# Patient Record
Sex: Male | Born: 1945 | Race: White | Hispanic: No | Marital: Married | Smoking: Never smoker
Health system: Southern US, Community
[De-identification: ages and names within clinical notes are randomized; demographics above are authoritative.]

## PROBLEM LIST (undated history)

## (undated) DIAGNOSIS — N281 Cyst of kidney, acquired: Secondary | ICD-10-CM

## (undated) DIAGNOSIS — I480 Paroxysmal atrial fibrillation: Secondary | ICD-10-CM

## (undated) DIAGNOSIS — Z9289 Personal history of other medical treatment: Secondary | ICD-10-CM

## (undated) DIAGNOSIS — Z9889 Other specified postprocedural states: Secondary | ICD-10-CM

## (undated) DIAGNOSIS — Z8711 Personal history of peptic ulcer disease: Secondary | ICD-10-CM

## (undated) DIAGNOSIS — N200 Calculus of kidney: Secondary | ICD-10-CM

## (undated) DIAGNOSIS — M1990 Unspecified osteoarthritis, unspecified site: Secondary | ICD-10-CM

## (undated) DIAGNOSIS — E119 Type 2 diabetes mellitus without complications: Secondary | ICD-10-CM

## (undated) DIAGNOSIS — I2510 Atherosclerotic heart disease of native coronary artery without angina pectoris: Secondary | ICD-10-CM

## (undated) DIAGNOSIS — I471 Supraventricular tachycardia, unspecified: Secondary | ICD-10-CM

## (undated) DIAGNOSIS — E785 Hyperlipidemia, unspecified: Secondary | ICD-10-CM

## (undated) DIAGNOSIS — Z87828 Personal history of other (healed) physical injury and trauma: Secondary | ICD-10-CM

## (undated) DIAGNOSIS — K5730 Diverticulosis of large intestine without perforation or abscess without bleeding: Secondary | ICD-10-CM

## (undated) DIAGNOSIS — N529 Male erectile dysfunction, unspecified: Secondary | ICD-10-CM

## (undated) DIAGNOSIS — I483 Typical atrial flutter: Secondary | ICD-10-CM

## (undated) DIAGNOSIS — Z87442 Personal history of urinary calculi: Secondary | ICD-10-CM

## (undated) DIAGNOSIS — Z8679 Personal history of other diseases of the circulatory system: Secondary | ICD-10-CM

## (undated) DIAGNOSIS — Z8719 Personal history of other diseases of the digestive system: Secondary | ICD-10-CM

## (undated) DIAGNOSIS — G4733 Obstructive sleep apnea (adult) (pediatric): Secondary | ICD-10-CM

## (undated) DIAGNOSIS — J309 Allergic rhinitis, unspecified: Secondary | ICD-10-CM

## (undated) DIAGNOSIS — Z85828 Personal history of other malignant neoplasm of skin: Secondary | ICD-10-CM

## (undated) DIAGNOSIS — N4340 Spermatocele of epididymis, unspecified: Secondary | ICD-10-CM

## (undated) HISTORY — DX: Hyperlipidemia, unspecified: E78.5

## (undated) HISTORY — DX: Typical atrial flutter: I48.3

## (undated) HISTORY — PX: OTHER SURGICAL HISTORY: SHX169

## (undated) HISTORY — PX: EPIDIDYMIS SURGERY: SHX843

---
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
## 1992-03-04 HISTORY — PX: LAPAROSCOPIC NISSEN FUNDOPLICATION: SHX1932

## 1996-03-04 HISTORY — PX: TOTAL KNEE ARTHROPLASTY: SHX125

## 1997-07-06 ENCOUNTER — Ambulatory Visit (HOSPITAL_COMMUNITY): Admission: RE | Admit: 1997-07-06 | Discharge: 1997-07-06 | Payer: Self-pay | Admitting: Gastroenterology

## 1998-02-06 ENCOUNTER — Ambulatory Visit (HOSPITAL_COMMUNITY): Admission: RE | Admit: 1998-02-06 | Discharge: 1998-02-06 | Payer: Self-pay | Admitting: Gastroenterology

## 1998-06-13 ENCOUNTER — Ambulatory Visit (HOSPITAL_COMMUNITY): Admission: RE | Admit: 1998-06-13 | Discharge: 1998-06-13 | Payer: Self-pay | Admitting: Urology

## 1999-04-09 ENCOUNTER — Ambulatory Visit (HOSPITAL_COMMUNITY): Admission: RE | Admit: 1999-04-09 | Discharge: 1999-04-09 | Payer: Self-pay | Admitting: Gastroenterology

## 2000-07-11 ENCOUNTER — Ambulatory Visit (HOSPITAL_COMMUNITY): Admission: RE | Admit: 2000-07-11 | Discharge: 2000-07-11 | Payer: Self-pay | Admitting: Gastroenterology

## 2000-07-11 ENCOUNTER — Encounter (INDEPENDENT_AMBULATORY_CARE_PROVIDER_SITE_OTHER): Payer: Self-pay | Admitting: *Deleted

## 2000-09-18 ENCOUNTER — Ambulatory Visit (HOSPITAL_COMMUNITY): Admission: RE | Admit: 2000-09-18 | Discharge: 2000-09-18 | Payer: Self-pay | Admitting: Urology

## 2000-09-18 ENCOUNTER — Encounter: Payer: Self-pay | Admitting: Urology

## 2003-04-25 ENCOUNTER — Encounter (INDEPENDENT_AMBULATORY_CARE_PROVIDER_SITE_OTHER): Payer: Self-pay | Admitting: Specialist

## 2003-04-25 ENCOUNTER — Ambulatory Visit (HOSPITAL_COMMUNITY): Admission: RE | Admit: 2003-04-25 | Discharge: 2003-04-25 | Payer: Self-pay | Admitting: Gastroenterology

## 2004-05-10 ENCOUNTER — Ambulatory Visit: Payer: Self-pay | Admitting: Internal Medicine

## 2004-05-16 ENCOUNTER — Ambulatory Visit: Payer: Self-pay | Admitting: Internal Medicine

## 2004-06-14 ENCOUNTER — Ambulatory Visit: Payer: Self-pay | Admitting: Internal Medicine

## 2005-06-05 ENCOUNTER — Ambulatory Visit: Payer: Self-pay | Admitting: Internal Medicine

## 2005-06-12 ENCOUNTER — Ambulatory Visit: Payer: Self-pay | Admitting: Internal Medicine

## 2006-06-13 ENCOUNTER — Ambulatory Visit: Payer: Self-pay | Admitting: Internal Medicine

## 2006-06-13 LAB — CONVERTED CEMR LAB
ALT: 20 units/L (ref 0–40)
Basophils Relative: 0.6 % (ref 0.0–1.0)
Bilirubin, Direct: 0.1 mg/dL (ref 0.0–0.3)
CO2: 29 meq/L (ref 19–32)
Calcium: 8.8 mg/dL (ref 8.4–10.5)
Eosinophils Relative: 3.6 % (ref 0.0–5.0)
GFR calc Af Amer: 127 mL/min
Glucose, Bld: 97 mg/dL (ref 70–99)
Hemoglobin: 15.8 g/dL (ref 13.0–17.0)
Leukocytes, UA: NEGATIVE
Lymphocytes Relative: 33.4 % (ref 12.0–46.0)
Monocytes Absolute: 0.6 10*3/uL (ref 0.2–0.7)
Neutro Abs: 2.7 10*3/uL (ref 1.4–7.7)
Nitrite: NEGATIVE
Platelets: 242 10*3/uL (ref 150–400)
Specific Gravity, Urine: 1.025 (ref 1.000–1.03)
Total Bilirubin: 0.9 mg/dL (ref 0.3–1.2)
Total Protein, Urine: NEGATIVE mg/dL
Total Protein: 6.5 g/dL (ref 6.0–8.3)
WBC: 5.3 10*3/uL (ref 4.5–10.5)

## 2006-06-18 ENCOUNTER — Ambulatory Visit: Payer: Self-pay | Admitting: Internal Medicine

## 2006-07-04 ENCOUNTER — Ambulatory Visit: Payer: Self-pay | Admitting: Internal Medicine

## 2006-11-05 ENCOUNTER — Ambulatory Visit: Payer: Self-pay | Admitting: Pulmonary Disease

## 2006-11-19 ENCOUNTER — Ambulatory Visit (HOSPITAL_BASED_OUTPATIENT_CLINIC_OR_DEPARTMENT_OTHER): Admission: RE | Admit: 2006-11-19 | Discharge: 2006-11-19 | Payer: Self-pay | Admitting: Pulmonary Disease

## 2006-11-19 ENCOUNTER — Ambulatory Visit: Payer: Self-pay | Admitting: Pulmonary Disease

## 2007-02-17 ENCOUNTER — Encounter: Payer: Self-pay | Admitting: Orthopedic Surgery

## 2007-02-18 ENCOUNTER — Ambulatory Visit: Payer: Self-pay | Admitting: Cardiology

## 2007-02-18 ENCOUNTER — Inpatient Hospital Stay (HOSPITAL_COMMUNITY): Admission: EM | Admit: 2007-02-18 | Discharge: 2007-02-24 | Payer: Self-pay | Admitting: Orthopedic Surgery

## 2007-02-19 ENCOUNTER — Ambulatory Visit: Payer: Self-pay | Admitting: Infectious Diseases

## 2007-02-19 ENCOUNTER — Encounter (INDEPENDENT_AMBULATORY_CARE_PROVIDER_SITE_OTHER): Payer: Self-pay | Admitting: Orthopedic Surgery

## 2007-03-04 ENCOUNTER — Inpatient Hospital Stay (HOSPITAL_COMMUNITY): Admission: AD | Admit: 2007-03-04 | Discharge: 2007-03-10 | Payer: Self-pay | Admitting: Orthopedic Surgery

## 2007-07-06 ENCOUNTER — Inpatient Hospital Stay (HOSPITAL_COMMUNITY): Admission: RE | Admit: 2007-07-06 | Discharge: 2007-07-09 | Payer: Self-pay | Admitting: Orthopedic Surgery

## 2007-07-06 HISTORY — PX: REVISION TOTAL KNEE ARTHROPLASTY: SUR1280

## 2007-11-25 ENCOUNTER — Telehealth: Payer: Self-pay | Admitting: Internal Medicine

## 2008-01-18 ENCOUNTER — Ambulatory Visit: Payer: Self-pay | Admitting: Internal Medicine

## 2008-01-18 DIAGNOSIS — K5730 Diverticulosis of large intestine without perforation or abscess without bleeding: Secondary | ICD-10-CM | POA: Insufficient documentation

## 2008-01-18 DIAGNOSIS — K219 Gastro-esophageal reflux disease without esophagitis: Secondary | ICD-10-CM | POA: Insufficient documentation

## 2008-01-18 DIAGNOSIS — Z87442 Personal history of urinary calculi: Secondary | ICD-10-CM | POA: Insufficient documentation

## 2008-01-18 DIAGNOSIS — J309 Allergic rhinitis, unspecified: Secondary | ICD-10-CM | POA: Insufficient documentation

## 2008-01-18 DIAGNOSIS — R7302 Impaired glucose tolerance (oral): Secondary | ICD-10-CM | POA: Insufficient documentation

## 2008-01-18 DIAGNOSIS — E785 Hyperlipidemia, unspecified: Secondary | ICD-10-CM | POA: Insufficient documentation

## 2008-01-18 DIAGNOSIS — G4733 Obstructive sleep apnea (adult) (pediatric): Secondary | ICD-10-CM | POA: Insufficient documentation

## 2008-01-18 LAB — CONVERTED CEMR LAB
ALT: 18 units/L (ref 0–53)
AST: 20 units/L (ref 0–37)
Albumin: 3.7 g/dL (ref 3.5–5.2)
Alkaline Phosphatase: 80 units/L (ref 39–117)
BUN: 17 mg/dL (ref 6–23)
Basophils Relative: 0.1 % (ref 0.0–3.0)
CO2: 27 meq/L (ref 19–32)
Chloride: 108 meq/L (ref 96–112)
Cholesterol: 155 mg/dL (ref 0–200)
Creatinine, Ser: 0.9 mg/dL (ref 0.4–1.5)
Creatinine,U: 183.5 mg/dL
Eosinophils Relative: 2 % (ref 0.0–5.0)
Hgb A1c MFr Bld: 5.8 % (ref 4.6–6.0)
LDL Cholesterol: 101 mg/dL — ABNORMAL HIGH (ref 0–99)
Leukocytes, UA: NEGATIVE
Lymphocytes Relative: 32.4 % (ref 12.0–46.0)
Monocytes Relative: 14.4 % — ABNORMAL HIGH (ref 3.0–12.0)
Neutrophils Relative %: 51.1 % (ref 43.0–77.0)
Nitrite: NEGATIVE
PSA: 1.08 ng/mL (ref 0.10–4.00)
Platelets: 200 10*3/uL (ref 150–400)
Potassium: 4.5 meq/L (ref 3.5–5.1)
RBC: 5.51 M/uL (ref 4.22–5.81)
Specific Gravity, Urine: 1.02 (ref 1.000–1.03)
Total CHOL/HDL Ratio: 5.3
Urine Glucose: NEGATIVE mg/dL
Urobilinogen, UA: 0.2 (ref 0.0–1.0)
Vit D, 1,25-Dihydroxy: 31 (ref 30–89)
WBC: 7.3 10*3/uL (ref 4.5–10.5)

## 2008-03-03 ENCOUNTER — Ambulatory Visit: Payer: Self-pay | Admitting: Internal Medicine

## 2008-03-03 DIAGNOSIS — L989 Disorder of the skin and subcutaneous tissue, unspecified: Secondary | ICD-10-CM | POA: Insufficient documentation

## 2008-03-03 DIAGNOSIS — M549 Dorsalgia, unspecified: Secondary | ICD-10-CM | POA: Insufficient documentation

## 2008-06-30 ENCOUNTER — Encounter: Payer: Self-pay | Admitting: Internal Medicine

## 2008-08-09 ENCOUNTER — Encounter: Payer: Self-pay | Admitting: Internal Medicine

## 2009-10-03 ENCOUNTER — Encounter: Payer: Self-pay | Admitting: Internal Medicine

## 2009-10-03 ENCOUNTER — Ambulatory Visit: Payer: Self-pay | Admitting: Internal Medicine

## 2009-10-03 DIAGNOSIS — R5383 Other fatigue: Secondary | ICD-10-CM | POA: Insufficient documentation

## 2009-10-03 DIAGNOSIS — H918X9 Other specified hearing loss, unspecified ear: Secondary | ICD-10-CM | POA: Insufficient documentation

## 2009-10-03 DIAGNOSIS — N529 Male erectile dysfunction, unspecified: Secondary | ICD-10-CM | POA: Insufficient documentation

## 2009-10-06 LAB — CONVERTED CEMR LAB
Testosterone Free: 48.9 pg/mL (ref 47.0–244.0)
Testosterone: 298.57 ng/dL — ABNORMAL LOW (ref 350–890)

## 2009-10-09 ENCOUNTER — Encounter: Payer: Self-pay | Admitting: Internal Medicine

## 2010-03-25 ENCOUNTER — Encounter: Payer: Self-pay | Admitting: Orthopedic Surgery

## 2010-04-01 LAB — CONVERTED CEMR LAB
AST: 21 units/L (ref 0–37)
Albumin: 3.9 g/dL (ref 3.5–5.2)
Basophils Absolute: 0 10*3/uL (ref 0.0–0.1)
Bilirubin Urine: NEGATIVE
CO2: 26 meq/L (ref 19–32)
Calcium: 8.9 mg/dL (ref 8.4–10.5)
Cholesterol: 179 mg/dL (ref 0–200)
Creatinine, Ser: 0.7 mg/dL (ref 0.4–1.5)
Eosinophils Absolute: 0.3 10*3/uL (ref 0.0–0.7)
HCT: 49.3 % (ref 39.0–52.0)
HDL: 33.9 mg/dL — ABNORMAL LOW (ref >39.00)
Hemoglobin: 17 g/dL (ref 13.0–17.0)
Hgb A1c MFr Bld: 6 % (ref 4.6–6.5)
Iron: 99 ug/dL (ref 42–165)
Leukocytes, UA: NEGATIVE
Lymphs Abs: 2.3 10*3/uL (ref 0.7–4.0)
MCHC: 34.5 g/dL (ref 30.0–36.0)
MCV: 89.7 fL (ref 78.0–100.0)
Microalb Creat Ratio: 1.3 mg/g (ref 0.0–30.0)
Monocytes Absolute: 0.9 10*3/uL (ref 0.1–1.0)
Monocytes Relative: 11.1 % (ref 3.0–12.0)
Neutro Abs: 4.5 10*3/uL (ref 1.4–7.7)
Nitrite: NEGATIVE
PSA: 1.21 ng/mL (ref 0.10–4.00)
Platelets: 203 10*3/uL (ref 150.0–400.0)
RDW: 13.3 % (ref 11.5–14.6)
Sed Rate: 4 mm/hr (ref 0–22)
Sodium: 143 meq/L (ref 135–145)
TSH: 1.22 microintl units/mL (ref 0.35–5.50)
Total Bilirubin: 0.7 mg/dL (ref 0.3–1.2)
Total CHOL/HDL Ratio: 5
Total Protein, Urine: NEGATIVE mg/dL
Transferrin: 264.7 mg/dL (ref 212.0–360.0)
Triglycerides: 127 mg/dL (ref 0.0–149.0)
pH: 5.5 (ref 5.0–8.0)
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
## 2010-04-03 NOTE — Consult Note (Signed)
Summary: [facility]  [facility]   Imported By: Sherian Rein 10/13/2009 09:22:17  _____________________________________________________________________  External Attachment:    Type:   Image     Comment:   External Document

## 2010-04-10 ENCOUNTER — Other Ambulatory Visit: Payer: Medicare Other

## 2010-04-10 ENCOUNTER — Encounter (INDEPENDENT_AMBULATORY_CARE_PROVIDER_SITE_OTHER): Payer: Self-pay | Admitting: *Deleted

## 2010-04-10 ENCOUNTER — Ambulatory Visit (INDEPENDENT_AMBULATORY_CARE_PROVIDER_SITE_OTHER): Payer: Medicare Other | Admitting: Internal Medicine

## 2010-04-10 ENCOUNTER — Encounter: Payer: Self-pay | Admitting: Internal Medicine

## 2010-04-10 ENCOUNTER — Other Ambulatory Visit: Payer: Self-pay | Admitting: Internal Medicine

## 2010-04-10 DIAGNOSIS — J309 Allergic rhinitis, unspecified: Secondary | ICD-10-CM

## 2010-04-10 DIAGNOSIS — E785 Hyperlipidemia, unspecified: Secondary | ICD-10-CM

## 2010-04-10 DIAGNOSIS — E119 Type 2 diabetes mellitus without complications: Secondary | ICD-10-CM

## 2010-04-10 DIAGNOSIS — M549 Dorsalgia, unspecified: Secondary | ICD-10-CM

## 2010-04-10 LAB — LIPID PANEL
Cholesterol: 159 mg/dL (ref 0–200)
LDL Cholesterol: 90 mg/dL (ref 0–99)
Total CHOL/HDL Ratio: 5

## 2010-04-10 LAB — BASIC METABOLIC PANEL
BUN: 13 mg/dL (ref 6–23)
CO2: 29 mEq/L (ref 19–32)
Calcium: 9.7 mg/dL (ref 8.4–10.5)
Chloride: 104 mEq/L (ref 96–112)
Creatinine, Ser: 0.8 mg/dL (ref 0.4–1.5)
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
## 2010-07-17 NOTE — Op Note (Signed)
NAMELENNIX, KNEISEL                ACCOUNT NO.:  [id number]   MEDICAL RECORD NO.:  [id number]          PATIENT TYPE:  INP   LOCATION:  1235                         FACILITY:  [facility]   PHYSICIAN:  Feliberto Gottron. Turner Daniels, M.D.   DATE OF BIRTH:  05-20-1945   DATE OF PROCEDURE:  02/18/2007  DATE OF DISCHARGE:                               OPERATIVE REPORT   PREOPERATIVE DIAGNOSIS:  Acute infection of a right total knee that was  placed in 1998 by me.   POSTOPERATIVE DIAGNOSIS:  Acute infection of a right total knee that was  placed in 1998 by me.   PROCEDURE:  Right total knee radical irrigation and debridement,  takedown of arthrofibrosis and revision of a tibial polyethylene  component.   SURGEON:  Feliberto Gottron.  Turner Daniels, MD.   FIRST ASSISTANT:  Skip Mayer PA-C.   ANESTHETIC:  General endotracheal.   ESTIMATED BLOOD LOSS:  Minimal.   FLUID REPLACEMENT:  A liter of crystalloid.   DRAINS PLACED:  Two medium Hemovacs and a Foley catheter.   URINE OUTPUT:  300 mL.   INDICATIONS FOR PROCEDURE:  A 65 year old man who had an [employer]  Scorpio right total knee performed by me in 1998.  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.  Was last seen in our office  around 2001 and was doing great.  He was then lost to follow-up.  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  That evening he ran a  fever up to a 102 and he came to the [facility] emergency room, where an  aspiration revealed purulent material in his right total knee.  My  partner, Dr. Jodi Geralds, was contacted.  The patient was admitted to  the hospital.  For logistical reasons he was transferred to [facility] because of overcrowding at [facility], and I saw him around noontime  today.  By then his Gram stain had come back showing 45,000 cells, all  polys, with  intracellular gram-positive cocci.  He is taken for a  radical irrigation and debridement versus removal of parts.  We had a  half-hour discussion of his options prior to surgery.  He understands  that complete removal of the parts leads to the highest success rate for  reimplantation.  However, because he has an acute infection of sudden  onset and relatively quick response surgically, it is reasonable to do a  radical irrigation and debridement, swap out the polyethylene, and treat  him with IV antibiotics and rifampin for 6-12 weeks.  The options were  discussed extensively with the patient.  It is his desire to try the  irrigation and debridement first and then if this fails, he is prepared  for removal of the components, placement of a PMMA spacer with  antibiotics and then reimplantation at a later date.   DESCRIPTION OF PROCEDURE:  The  patient identified by armband, taken to  the operating room at [facility].  Appropriate  anesthetic monitors were attached and general endotracheal anesthesia  induced with the patient in a supine position.  A tourniquet was applied  high to the right thigh and the right lower extremity was then prepped  and draped in the usual sterile fashion from the ankle to the  tourniquet, and it should be noted that we kept the limb elevated for 5  minutes and then inflated the tourniquet without using the Esmarch wrap.  After the tourniquet was up, we recreated the anterior midline incision  used for the implantation of the original total knee, cut through the  skin and subcutaneous tissue, which had a normal appearance, as did the  patellar tendon.  When we got through the tendon, the synovium was quite  inflamed and when we cut through the synovium, probably 70 or 80 mL of  pus came forth and was removed with suction.  We then set about  performing a fairly radical synovectomy, going from anterior to  posterior around both sides.  This  took a great deal of time because he  did have arthrofibrosis with only about 0-30 degrees' motion  preoperatively, and x-rays taken in 2001 had shown some growth of  posterior osteophytes, which limited his motion.  We continued to work  our way around posteromedially as well as posterolaterally, removed  those posterior osteophytes and completed the synovectomy posteriorly  and through the box.  Satisfied with the removal of all the infected  synovium, we then carefully tested each one of the components to see if  there was any evidence of loosening of the components.  There was not,  and the cement nails were intact to the patella, the tibia and the  femur.  We actually used the extraction instruments with a manual  traction to see if any of the parts were loose, and they were not.  At  this point we irrigated the wound out with 3 L of pulse-lavage normal  saline.  We then bathed the components in Clorpactin solution for 1-  minute intervals and then irrigated out with another 3 L of pulse  lavage.  At this point we sized for a #15-mm replacement tibial bearing,  as it had been removed at the beginning of the procedure, to enhance our  ability to get to the posterior compartment and also to remove one piece  of foreign material.  We performed trials with a 12-mm and a 15-mm  spacer.  The 15 had the best fit.  That was the one that was removed and  a new 15-mm #11 tibial bearing was inserted without difficulty.  We  checked the knee for stability and his range of motion, which was only 0-  30 to start with, was now 0-120 degrees, and no thumb pressure was  required to keep the patella in place.  Again medium Hemovac drains were  placed deep in the wound.  The parapatellar arthrotomy was closed with  running 0 Vicryl suture, the subcutaneous tissue with a 2-0 Vicryl  suture, and the skin with skin staples.  A dressing of Xeroform 4x4  dressing sponges, Webril and an Ace wrap was then  applied.  The  tourniquet was let down, the Hemovac drains were charged, the patient  was then awakened and taken to the recovery room with a knee immobilizer  in place to continue on IV vancomycin until cultures and  sensitivities  come back.      Feliberto Gottron. Turner Daniels, M.D.  Electronically Signed     FJR/MEDQ  D:  02/18/2007  T:  02/19/2007  Job:  [id number]
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
## 2010-07-17 NOTE — Procedures (Signed)
Eric Santos, Eric Santos                ACCOUNT NO.:  [id number]   MEDICAL RECORD NO.:  [id number]         PATIENT TYPE:  OUT   LOCATION:  SLEEP CENTER                 FACILITY:  [facility]   PHYSICIAN:  Coralyn Helling, MD        DATE OF BIRTH:  February 11, 1946   DATE OF STUDY:  11/19/2006                            NOCTURNAL POLYSOMNOGRAM   REFERRING PHYSICIAN:  Coralyn Helling, MD   INDICATION FOR STUDY:  This is an individual who has a history of sleep  disruption and excessive daytime sleepiness.  He is referred to the  Sleep Lab for evaluation of hypersomnia with obstructive sleep apnea.   EPWORTH SLEEPINESS SCORE:  23.   MEDICATIONS:  Aleve, Flonase, lovastatin, Claritin.   SLEEP ARCHITECTURE:  Total recording time was 390 minutes.  Total sleep  time was 331 minutes.  Sleep efficiency is 85%.  Sleep latency is 12.5  minutes.  REM latency is 107 minutes.  The patient was observed in both  the supine and non-supine position.  The patient did follow a split  night study protocol.   RESPIRATORY DATA:  The average respiratory rate was 16.  During the  diagnostic portion of the test, the overall apnea hypopnea index was 64.  The events were exclusively obstructive in nature.  Moderate snoring was  noted by the technician.  There did appear to be a significant REM as  well as positional affect to his sleep apnea.  During the therapeutic  portion of the test, the patient was titrated from a CPAP pressure of 6  to 20 cm of water.  At a CPAP pressure setting of 19 cm of water the  apnea hypopnea index was reduced to 3.1.  At this pressure setting the  patient was observed in REM sleep but not supine sleep and snoring was  eliminated.   OXYGEN DATA:  The baseline oxygenation was 93%.  The oxygen saturation  nadir was 77%.  At a CPAP pressure setting of 19 cm of water the mean  oxygenation during non-REM sleep was 94%, the mean oxygenation during  REM sleep was 94%, the minimal oxygenation during non-REM  sleep was 90%,  and the minimal oxygenation during REM sleep was 89%.   CARDIAC DATA:  The rhythm strip showed normal sinus rhythm with an  average heart rate of 67 and occasional PVC's and PAC's.   MOVEMENT-PARASOMNIA:  The periodic limb movement index was zero, and the  patient had one restroom trip.   IMPRESSIONS-RECOMMENDATIONS:  This was a split night study protocol.  During the diagnostic portion of the test the patient was found to have  severe obstructive sleep apnea with an apnea hypopnea index of 64, he  had an oxygen saturation nadir of 77%.  During the therapeutic portion  of the test, he was titrated to a CPAP pressure setting of 19 cm of  water with a reduction in his apnea hypopnea index of 3.1.  At this  pressure setting he was observed in REM sleep and snoring was  eliminated, but he was not observed in supine sleep.  The patient could  be  started on CPAP at  19 cm of water and monitored for his clinical  response.  If he is having difficulty still, then he would likely need  to have a repeat titration study using BiPAP.      Coralyn Helling, MD  Diplomat, American Board of Sleep Medicine  Electronically Signed     VS/MEDQ  D:  11/20/2006 12:28:33  T:  11/20/2006 14:21:31  Job:  [id number]

## 2010-07-17 NOTE — H&P (Signed)
NAME:  Eric Santos, Eric Santos                ACCOUNT NO.:  [id number]   MEDICAL RECORD NO.:  [id number]          PATIENT TYPE:  INP   LOCATION:  1621                         FACILITY:  [facility]   PHYSICIAN:  Harvie Junior, M.D.   DATE OF BIRTH:  06-18-1945   DATE OF ADMISSION:  02/18/2007  DATE OF DISCHARGE:                              HISTORY & PHYSICAL   HISTORY OF PRESENT ILLNESS:  A 65 year old male known to the orthopedic  surgery service.  Eric Santos was in his usual state of health up until  yesterday when he began having some increasing pain in the right knee.  He basically had a total knee placed 10 years ago by Dr. Gean Birchwood.  He never really got great motion postoperatively.  He got about 70  degrees of flexion that has been going down over time and he is  essentially left now with about 30 degrees of flexion, but he was having  some difficulty due to pain with straightening the knee out and as of  yesterday began having some increasing pain and called the office and  talked to [place].  She told him that he should keep an eye on it, but if  he started running fever, he needed to go to the emergency room.  He did  start running a fever at home of 102 and because of that ultimately  presented to the emergency room with complaints of pain and swelling.  We were consulted ultimately, and at that time, his past medical history  was remarkable for being on Lovastatin, Aleve, Claritin, Flonase, and a  baby aspirin.  He has had previous gallbladder surgery and kidney stone  surgery and right knee surgery as outlined and he has had a stomach wrap  for his hiatal hernia.   ALLERGIES:  NONE.   SOCIAL HISTORY:  Nonsmoker and nondrinker.   FAMILY HISTORY:  Reviewed and noncontributory.   PHYSICAL EXAMINATION:  GENERAL:  He is neurovascularly intact.  HEENT:  Within normal limits.  LUNGS:  Clear to auscultation.  HEART:  Regular rate and rhythm.  ABDOMEN:  Soft and nontender.  EXTREMITIES:   Right lower extremity shows examination of about 0-25  degrees with well-healed wounds.  There is warmth with no erythema and  some obvious effusion.  His left lower extremity shows full range of  motion without significant pain.  His minimal pain with range of motion  of the hip.   IMAGING:  X-rays were taken which show a well-fixed total knee with some  effusion.  No fracture-dislocation.   PROCEDURE:  An aspiration was performed in the emergency room which  yielded 30 mL of turbid fluid and this was sent for studies.   PLAN:  He will be admitted for blood work, blood cultures and evaluation  as needed.  He will ultimately likely go on to need debridement of the  wound with possible removal of parts or could possibly be able to  salvage parts with poly swap.  We will discuss with them further later  today.      Harvie Junior,  M.D.  Electronically Signed     JLG/MEDQ  D:  02/18/2007  T:  02/18/2007  Job:  [id number]   cc:   Medical Records

## 2010-07-17 NOTE — Op Note (Signed)
Eric Santos, Eric Santos                ACCOUNT NO.:  [id number]   MEDICAL RECORD NO.:  [id number]          PATIENT TYPE:  INP   LOCATION:  5017                         FACILITY:  [facility]   PHYSICIAN:  Feliberto Gottron. Turner Daniels, M.D.   DATE OF BIRTH:  Dec 06, 1945   DATE OF PROCEDURE:  07/06/2007  DATE OF DISCHARGE:                               OPERATIVE REPORT   PREOPERATIVE DIAGNOSIS:  Status post implantation of  polymethylmethacrylate spacer with antibiotics into right knee that had  spontaneously come down with a MRSA infection back I believe in December  2009.  The original total knee was implanted in 1998.   POSTOPERATIVE DIAGNOSIS:  Status post implantation of  polymethylmethacrylate spacer with antibiotics into right knee that had  spontaneously come down with a MRSA infection back in I believe December  2009.  The original total knee was implanted in 1998.   PROCEDURE:  Revision of right total knee arthroplasty with removal of  the PMMA spacer and implantation of a complex rotating bearing total  knee, DePuy was the manufacturer.  The implants included a #5 right TC3  femoral component with a 40-mm cone and a 16 x 75 stem on the tibial  side.  It was a #4 tibial baseplate with a 34 cone and a 16 x 75 tibial  stem and it was a 10-mm TC3 rotating platform bearing.  On the patella,  we used a 41-mm patellar button double batch of DePuy HV cement with 2.4  grams of tobramycin in the cement since he had previously been infected  with MRSA.   SURGEON:  Feliberto Gottron.  Turner Daniels, MD   FIRST ASSISTANT:  Shirl Harris, PA-C.   ANESTHESIA:  General endotracheal.   ESTIMATED BLOOD LOSS:  Minimal.   FLUID REPLACEMENT:  1500 mL of crystalloid.   DRAINS PLACED:  Foley catheter with urine output of 300 mL and 2 medium  Hemovacs.  Tourniquet time was 2 hour and 15 minutes.   INDICATIONS FOR PROCEDURE:  A 65 year old gentleman who underwent the  implantation of a cemented total knee by me in 1998.  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  Ten years  later, he was in the field, his knees swelled up, he developed a fever  and went to the [facility]  Emergency Room.  Aspiration revealed pus  because it was gram-positive cocci and was community-acquired in  [place], [place].  The assumption probably was not MRSA and we  went ahead and did a synovectomy and a polyethylene swap.  Unfortunately, 4 days after the irrigation, debridement, and poly swap,  it came back as MRSA.  He did not get better on IV antibiotics and few  weeks later we took all the parts out.  I believe that was late December  or early January of 2009.  With removal of the parts, we placed a PMMA  spacer with 4.8 grams of tobramycin and 4.5 grams of Zinacef and he  received IV vancomycin for 6 weeks followed by 6 weeks of doxycycline.  His swelling  and fever went away.  His sed rate and CRP came to normal  and aspiration done 2 weeks after discontinuing the doxycycline around  the 14-week mark got a couple of mLs of clear fluid that came back no  growth and because of this, he has elected to undertake the risk of a  removal of the spacer and reimplantation and he is well aware that there  is about a 25% chance of the infection coming back even we do everything  right, but this is his desire.   DESCRIPTION OF PROCEDURE:  The patient was identified by armband and in  the block here at [facility] received a femoral nerve block.  He also  received a gram of vancomycin and then was taken to the operating room  where the appropriate anesthetic monitors were attached and general  endotracheal anesthesia was induced.  A wide Zimmer tourniquet was  applied high to the right thigh and the right lower extremity prepped  and draped in usual sterile fashion from the ankle to the tourniquet.  The limb was wrapped with an Esmarch bandage, the tourniquet inflated to  300 mmHg and the anterior  midline incision was recreated followed by a  medial parapatellar arthrotomy.  The pseudo joint fluid appeared clear,  was sent off for Gram stain and culture came back with few monos, few  polys, and no organisms.  We then set about removing exuberant scar  tissue from around the patella and the medial and lateral side to the  femur enhancing our exposure of the PMMA spacer, which was then removed  with a 1-inch wide osteotome in pieces.  We also removed the PMMA  spacer.  It was placed beneath the patella and freshened up the edges of  the bone peripherally.  No pockets of pus were encountered.  Once this  had been accomplished, we were able to flex the knee up.  We did not  evert the patella, just let it go along the lateral side and continued  our exposure of the proximal tibia.  Finally, we went ahead and  instrumented the proximal tibia with the DePuy reamers, reaming up to a  16-mm reamer to the appropriate depth for a 75 stem.  We then conically  reamed and put in the 28-mm broach and then the 34-mm broach, which went  to the appropriate depth.  It was left in place and used as a cutting  guide for the proximal tibia.  We then entered the distal femur, again  with the reamers and reamed up to a 16-mm reamer to the appropriate  depth for a 75 stem.  We then conically reamed and broached up to a 40-  mm femoral broach to the appropriate depth for a very minor cut of the  distal femur.  The cutting guide was then applied to the broach using  the small stem on the broach and the distal femoral cut was  accomplished.  We then pinned the chamfer cutting guide in place and  performed a chamfer cuts.  There was actually very little bone removed  because of the preexisting infection with osteolysis.  Once the chamfer  cuts had been accomplished, we addressed the patella, everted it, and  again using the power swab just removed a very thin wafer bone from the  posterior aspect of the patella  sized for a 41 button and drilled.  At  this point, we assembled a femoral trial with a 16 x 75  trial stem, a 40-  mm module, and a #5 femoral component and hammered the trial into place.  With a broach artery in the tibia, we put in the plastic baseplate and a  10-mm TC3 stem trial platform bearing and reduced the knee.  It came to  full extension and flexed easily to 130 degrees and the trial patella  tracked nicely.  At this point, all the trial components were removed  using curettes and rongeurs.  We removed soft tissue from the sclerotic  bony edges and also trimmed off peripheral osteophytes.  The bony  surfaces then water picked clean and dry with suction and sponges and we  assembled the real components on the tibial side.  We set the rotation  for the 34-mm module in relation to the tibial baseplate and then  assembled the 16 x 75 stem and torqued it to the appropriate tension.  On the femoral side, there was a #5 right femur with a 0 screw in  proximal module followed by a 40-mm conical module and a 16 x 75 stem  and the conical module was put in about 7 degrees clockwise rotation  after a trial fitting it in the femur.  All stems were then  appropriately torqued and a double batch of DePuy HV cement was mixed  with 2.4 grams of tobramycin.  After thoroughly mixing the cement and  the antibiotics together in order was applied to the conical module of  the tibial and tibial baseplate and the proximal tibia itself and then  the tibia was hammered into place and excess cement removed.  On the  femoral side, it was applied to the conical module on the femur and the  mating surfaces of the femur except for the posterior condyles of the  femur itself.  This was then hammered into place with a good fit.  The  patellar button had cement applied to the both mating surfaces and was  squeezed into place with a clamp and excess cement removed.  We then  water picked cleaned the wound one  more time and placed in deep  Hemovacs.  After the cement had cured, the clamp was removed.  The 10-mm  TC3 bearing for the #5 femoral component was placed in the tibial  baseplate and the knee reduced.  It came to full extension and flexed  easily to 120 degrees with no significant laxity of the joint.  With the  drains in place, we water picked the wound, cleaned one more time,  closed the parapatellar arthrotomy with running #1 Vicryl suture, the  subcutaneous tissue with 0 undyed Vicryl suture in 2  layers and then the skin with skin staples.  A dressing of Xeroform, 4 x  4 dressing sponges, Webril, and Ace wrap were applied.  The tourniquet  was let down.  The Hemovac drains hooked up and the patient was then  awakened and taken to the recovery room without difficulty.      Feliberto Gottron. Turner Daniels, M.D.  Electronically Signed     FJR/MEDQ  D:  07/06/2007  T:  07/07/2007  Job:  [id number]

## 2010-07-17 NOTE — Discharge Summary (Signed)
Eric Santos, Eric Santos                ACCOUNT NO.:  [id number]   MEDICAL RECORD NO.:  [id number]          PATIENT TYPE:  INP   LOCATION:  5017                         FACILITY:  [facility]   PHYSICIAN:  Feliberto Gottron. Turner Daniels, M.D.   DATE OF BIRTH:  1945/12/09   DATE OF ADMISSION:  07/06/2007  DATE OF DISCHARGE:  07/09/2007                               DISCHARGE SUMMARY   CHIEF COMPLAINT:  Painful right total knee arthroplasty.   HISTORY OF PRESENT ILLNESS:  Eric Santos had a primary right total knee  arthroplasty placed in 1998.  He did well for 10 years before getting a  community-acquired MRSA infection.  A irrigation and debridement of the  total knee was attempted without success and the prosthesis was removed  in December 2008 and a spacer was placed.  Eric Santos completed 2 courses  of antibiotics.  A joint fluid that was aspirated in the office in April  2009, showed no evidence of current infection, pre-op CBC, ESR, CRP all  were normal.  Eric Santos desires a surgical intervention to have a new  total knee arthroplasty placed.   PAST MEDICAL HISTORY:  Significant for high cholesterol and seasonal  allergies.   PAST SURGICAL HISTORY:  He had a right total knee arthroplasty in 1998.  A right total knee removal of part and revision in 2008.   SOCIAL HISTORY:  He is nonsmoker.  He does not use alcohol.  He is  married.   ALLERGIES:  No known drug allergies.   MEDICATIONS:  1. Darvocet 650 mg 1 tab p.o. daily.  2. Lovastatin 20 mg 1 tab p.o. daily.  3. Claritin 10 mg 1 tab p.o. daily.  4. Flonase 1-2 sprays daily.  5. Aspirin 81 mg 1 p.o. daily.   PHYSICAL EXAMINATION:  Examination of the right lower extremity  demonstrates the patient has no obvious effusion in a right knee, no  warmth is noted.  He is neurovascularly intact.  His skin is intact.  He  has well-healed surgical incision.  His range of motion about the knee  is estimated to be approximately 0 to 20 degrees.  His x-rays  demonstrate end-stage degenerative joint disease with a placement of a  cement spacer.   PREOPERATIVE LABS:  White blood count 6.8, hemoglobin 15.4, hematocrit  49.1, and platelet 319.  A sed rate 14.  Urinalysis was with in normal  limits.   HOSPITAL COURSE:  Eric Santos was admitted on Jul 06, 2007.  After being  prepped for surgery, he underwent a re-implantation of the right total  knee arthroplasty using stemmed Depuy TC3 components.  An intraoperative  cultures were taken and were found to be negative.  He tolerated the  procedure well.  Postoperative x-rays were taken and demonstrated well-  placed, well-fixed right total knee prosthesis.  On the first  postoperative day, Eric Santos was tolerating p.o. intake well and  ambulating with physical therapy.  His wound was found to have moderate  dried blood on the dressings and the dressings was changed.  On the  second postoperative day, Mr.  Santos reported a continued improvement in  his pain and his ability to ambulate with physical therapy using his  walker.  His wound was found to be clean and dry.  On the third  postoperative day, Eric Santos was discharged to his home.   DISPOSITION:  The patient will be discharged to his home on Jul 09, 2007.  Home healthcare will manage his wound and his Coumadin therapy.  Physical therapy will help him with ambulation and activities of daily  living, but will not be working on his range of motion.   DISCHARGE MEDICATIONS:  As per the HMR with the addition of:  1. Coumadin 5 mg, take as directed.  2. Percocet 5 mg 1-2 tabs p.o. q.4 h. p.r.n. pain.  3. Doxycycline 10 mg 1 tab p.o. b.i.d., and he will take this medicine      definitely.   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   FOLLOWUP:  He will be weightbearing as tolerated with the walker and  will return to see Dr. Turner Daniels at the clinic in 1 week.      Feliberto Gottron. Turner Daniels, M.D.  Electronically Signed     Feliberto Gottron. Turner Daniels, M.D.  Electronically Signed    FJR/MEDQ  D:  07/09/2007  T:  07/10/2007  Job:  [id number]

## 2010-07-17 NOTE — Consult Note (Signed)
NAME:  Eric Santos, Eric Santos                ACCOUNT NO.:  [id number]   MEDICAL RECORD NO.:  [id number]          PATIENT TYPE:  INP   LOCATION:  1235                         [facility]AC[facility]   PHYSICIAN:  Luis Abed, MD, FACCDATE OF BIRTH:  11/25/1945   DATE OF CONSULTATION:  02/19/2007  DATE OF DISCHARGE:                                 CONSULTATION   PRIMARY CARE PHYSICIAN:  Corwin Levins, MD.   ORTHOPEDIC SURGEON:  Feliberto Gottron. Turner Daniels, M.D.   PRIMARY CARDIOLOGIST:  New and will be Luis Abed, MD.   HISTORY OF PRESENT ILLNESS:  This is a very pleasant 65 year old obese  Caucasian male with no prior cardiac history, who was admitted for left  knee surgery secondary to effusion.  The patient had done well  postoperatively, actually he had the surgery at [facility] and  transferred over to [facility] secondary to bed availability.   This a.m., the patient has sudden onset of chest pressure and mild  shortness of breath.  EKG revealed atrial fibrillation with rapid  ventricular response in the 180s.  We were asked to emergently evaluate  him secondary to this.   On evaluation, the patient was found to be having mild chest pressure  and some mild shortness of breath.  Heart rate was found to be in the  180s.  On assessment, the patient converted back to sinus tachycardia  and then back to atrial fibrillation.  The patient's EKG revealed ST  depression noted before May 2006 and concavity noted in the inferior  leads.  The patient was given IV Lopressor x 1 with heart rate coming  down into the 120s.  Blood pressure did improve when heart rate improved  to 123 systolically.  The patient's chest pressure decreased somewhat  and then he began to feel some flushing, but his blood pressure and  heart rate remained stable.   REVIEW OF SYSTEMS:  Chest pressure, mild shortness of breath, otherwise  no diaphoresis, dizziness, nausea, vomiting.   PAST MEDICAL HISTORY:  1. Hyperlipidemia.  2.  Arthritis.   PAST SURGICAL HISTORY:  Hiatal hernia repair.   FAMILY HISTORY:  Unknown at present.   SOCIAL HISTORY:  The patient does not smoke.  Does not drink alcohol.  No more details.   LABORATORY DATA:  Hemoglobin 13.1, hematocrit 37.8, white blood cells  19.2, platelets 185, sodium 136, potassium 4.6, chloride 103, CO2 26,  BUN 19, creatinine 0.94, glucose 127, sedimentation rate is 8, PT 16.9,  INR 1.3, PTT 33.   DIAGNOSTICS:  1. Chest x-ray reveals no acute findings.  Low lung volumes.  2. Initial EKG:  Atrial fibrillation with RVR, ventricular rate of 179      beats per minute with ST depression noted laterally.   CURRENT MEDICATIONS:  1. Vancomycin 1250 mg q.12 h.  2. Rifampin 300 mg b.i.d.  3. Percocet 1-2 tablets p.r.n. pain.   ALLERGIES:  NO KNOWN DRUG ALLERGIES.   PHYSICAL EXAMINATION:  GENERAL:  He is awake, alert and oriented.  Complaining of some mild chest pressure and shortness of breath.  HEENT:  Head is normocephalic, atraumatic.  Eyes:  PERRLA.  Mucous  membranes of mouth are pink and moist.  Tongue midline.  NECK:  Supple.  No JVD or carotid bruits appreciated.  The patient's  neck is obese.  CARDIOVASCULAR:  Irregular rapid rhythm without murmurs, rubs or  gallops.  LUNGS:  Essentially clear to auscultation, diminished bibasilar.  ABDOMEN:  Obese, nontender, 2+ bowel sounds.  EXTREMITIES:  He has a knee brace on the right with good pulses  bilaterally.  SKIN:  Warm and dry.  NEURO:  Intact.   IMPRESSION:  1. Postoperative atrial fibrillation with rapid ventricular response      with paroxysmal qualities.  2. Status post right knee surgery.  3. Hyperlipidemia.  4. Obesity.   PLAN:  The patient will be started on a Cardizem drip after bolus and  admitted to telemetry.  At this time, the patient is stable and has no  need for cardioversion emergently.  The patient will be evaluated with  echocardiogram.  We will evaluate labs and cardiac  enzymes to evaluate predominant cause  of this.  We will increase his IV fluids to 150 mg an hour with a 250 mg  bolus over 1 hour.  This has been discussed with Dr. Myrtis Ser as well as the  patient and his family.      Bettey Mare. Lyman Bishop, NP      Luis Abed, MD, [facility]  Electronically Signed    KML/MEDQ  D:  02/19/2007  T:  02/19/2007  Job:  [id number]   cc:   Corwin Levins, MD  [address]  [place]  [place] [address]   Feliberto Gottron. Turner Daniels, M.D.  Fax: [phone]

## 2010-07-17 NOTE — Op Note (Signed)
Eric Santos, Eric Santos                ACCOUNT NO.:  [id number]   MEDICAL RECORD NO.:  [id number]          PATIENT TYPE:  INP   LOCATION:  5035                         FACILITY:  [facility]   PHYSICIAN:  Feliberto Gottron. Turner Daniels, M.D.   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   PROCEDURE:  Radical irrigation and debridement, right total knee, with  removal of all implants and cement and placement of a TMMA cement spacer  with 4.8 g of tobramycin power and 4.5 g Zinacef powder mixed in a  quadruple batch of cement.   SURGEON:  Feliberto Gottron. Turner Daniels, M.D.   FIRST ASSISTANT:  Lianne Cure, P.A.-C.   ANESTHETIC:  General endotracheal.   ESTIMATED BLOOD LOSS:  Minimal.   FLUID REPLACEMENT:  1200 mL crystalloid.   DRAINS PLACED:  Two large-bore Hemovac and a Foley catheter.  Urine  output 300 mL.   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  He  did well until February 17, 2007 when he was out in a field, and his  knee swelled up and became hot, red and swollen.  He called the office  to come in the next day which we recommended, but his pain became  unbearable that evening.  He came to [facility] where pus was  aspirated from his right total knee, and he was prepared for surgical  intervention on that day, February 18, 2007.  Because this was an acute  hematogenous infection apparently and the Gram stain showed gram-  positive cocci, the odds on favor was a methicillin-sensitive staph  aureus.  He elected to have a radical synovectomy and poly swab which we  did on December 17.  He did well for the  first 2 weeks.  The staples  were removed last week, but he presented to our office this morning with  an abscess on the inferior aspect of the wound and is a candidate now  for repeat radical synovectomy, irrigation, debridement, and removal of  all metal and cement and foreign bodies.  Risks and benefits of surgery  discussed, questions answered.   DESCRIPTION OF PROCEDURE:  The patient identified by arm band, taken to  the operating room at [facility] where the appropriate  anesthetic monitors were attached and general endotracheal anesthesia  induced with the patient in supine position.  Foley catheter was  inserted.  Tourniquet applied high to the right thigh.  Right lower  extremity prepped and draped in sterile fashion from the ankle to the  tourniquet.  Limb was kept elevated for 5 minutes and tourniquet  inflated to 350 mmHg.  Using the old anterior midline incision, we  reproduced the medial parapatellar arthrotomy and removed all suture  material and once again performed  a radical synovectomy.  Using small  osteotomes and a small oscillating saw, we then worked our way around  the femoral, tibial and patellar components and were able to remove them  from the femur, tibia and patella respectively with minimal bony damage.  Using rongeurs, curettes, and osteotomes, we then removed all cement  from the interface on the bone and all polyethylene particles as well.  This also enhanced our ability to perform a posterior synovectomy which  was accomplished.  The wound was then irrigated out with 3 full liters  of pulse lavage.  When satisfied with the irrigation and debridement, a  quadruple batch of [place]os polymethyl methacrylate cement was mixed with  4.8 g of tobramycin and 4.5 g Zinacef and then inserted as a custom-  molded spacer between the tibia and femur, and then a smaller spacer was  inserted between the patella and the femur.  As the cement hardened, we   cooled it with another 3 liters of pulse lavage.  Large bore Hemovac  drains were placed in the wound, and then the peripatellar patellar  arthrotomy was closed with running #1 Vicryl suture and the skin and  subcutaneous tissue in 1 layer with a running vertical mattress suture  of 0 nylon.  A dressing of Xeroform 4x4 dressing sponges Webril and an  Ace wrap were then applied.  Tourniquet was let down.  A short knee  immobilizer was applied.  The patient was awakened and taken to recovery  room without difficulty.  No intraoperative cultures were taken because  we already had good cultures showing MRSA infection from the first  surgery.      Feliberto Gottron. Turner Daniels, M.D.  Electronically Signed     FJR/MEDQ  D:  03/04/2007  T:  03/04/2007  Job:  [id number]

## 2010-07-20 NOTE — Discharge Summary (Signed)
Eric Santos, Eric Santos                ACCOUNT NO.:  [id number]   MEDICAL RECORD NO.:  [id number]          PATIENT TYPE:  INP   LOCATION:  5035                         FACILITY:  [facility]   PHYSICIAN:  Feliberto Gottron. Turner Daniels, M.D.   DATE OF BIRTH:  Jan 28, 1946   DATE OF ADMISSION:  03/04/2007  DATE OF DISCHARGE:  03/10/2007                               DISCHARGE SUMMARY   DISCHARGE SUMMARY:   DIAGNOSIS:  Infected right total knee.   PROCEDURE:  While in hospital, radical irrigation and debridement of  right total knee with removal of all implants.   HISTORY OF PRESENT ILLNESS:  The patient is a 66 year old male known to  the orthopedic surgery service having undergone a total knee by Dr.  Turner Daniels some 10 years ago.  Physical exam as mentioned is an update of the  previous admission history that was performed on February 18, 2007,  after which the patient underwent his initial procedure of radical  irrigation and debridement without removal of parts and that this is a  readmission for which we are reinstating the H&P.  He was in his usual  state of good health until the day before this admission when he began  having increasing pain in the right knee.  He had about 70 degrees of  flexion that has been decreasing.  He had called that day and was told  that we would certainly recommend he be seen but because of personal  matters he elected to wait until that evening when the pain became so  severe that he presented at the emergency department.  He was running a  fever at home of 102 and Dr. Luiz Blare was consulted.  Because of the  appearance of the knee as well as the history of fever, the patient was  aspirated in the emergency room which yielded 30 ml of turbid fluid and  it was sent for studies.   PAST MEDICAL HISTORY:  Significant for:  1. Increased lipids for which he takes Lovastatin.  2. He also takes Aleve, Claritin for seasonal allergies, Flonase and      baby aspirin.  3. Previous  gallbladder surgery.  4. Kidney stone surgery.  5. Right knee total knee.  6. Stomach for hiatal hernia.   No known drug allergies.   SOCIAL HISTORY:  Nonsmoker, nondrinker.   FAMILY HISTORY:  Reviewed and noncontributory.   PHYSICAL EXAMINATION:  GENERAL:  The patient is neurovascularly intact.  HEENT:  Within normal limits.  LUNGS:  Clear to auscultation.  HEART:  Regular rate and rhythm.  ABDOMEN:  Soft, nontender.  EXTREMITIES:  Right lower extremity:  Range of motion 0-25 degrees, no  outward breakdown of skin, positive pain at any attempt at range of  motion.  X-rays show a well place, well fixed total knee with positive  fusion.  No fracture/dislocation.   PREOPERATIVE LABORATORY:  Including CBC, C-MET, chest x-ray, EKG, PT and  PTT were all within normal limits with the exception of hemoglobin which  showed 12.4, hematocrit 37.  Sed rate of 102.  C-reactive protein of  5.8.  HOSPITAL COURSE:  On the date of admission, which for this procedure was  March 04, 2007, the patient underwent a radical irrigation  debridement of the right total knee with removal of all implants and  cement as well as placement of a TMMA cement spacer with tobramycin  powder and 4.5 grams of Zinacef powder mixed into the cement.  Two large  bore Hemovac drains were placed as well as a Foley catheter.  The  patient was placed on perioperative antibiotics.  He was placed on  postoperative Coumadin prophylaxis with a target INR of 1.5 to 2.  He  was placed on postoperative PCA Dilaudid pump for pain control as well  as IV vancomycin.  Postoperative day one, the patient was comfortable, minimal complaint of  pain.  He was afebrile.  WBC 10.8, hemoglobin 11.1.  B-MET within normal  limits.  The immobilizer was intact.  He was neurovascularly intact  distally.  Drain output 200 plus mL.  Postoperative day two, the patient was continuing to improve in pain.  Hemoglobin 10.2, WBC 8.6.  INR 2.2.   Calf was soft and nontender.  The  Foley was discontinued.  Postoperative day three, the patient doing well.  He had walked with  physical therapy with only 20 pound weightbearing status.  Hemoglobin  10.2.  He was afebrile.  The drain remained active.  PCA was  discontinued.  Postoperative day four, the patient was complaining of a sore knee but  was voiding without difficulty.  He was complaining of some hemorrhoidal  pain that he had in the past and was given ointment to relieve that.  The drains remained intact.  Hemoglobin 10.5.  Postoperative day five, the patient was reporting moderate pain.  T-max  99.3.  Hemoglobin 10.8, WBC 7.  INR 1.8.  Dressing was with dry blood,  drain output 50-75 each shift.  The wound had positive erythema.  Postoperative day six, the patient was awake and alert, decreasing pain,  taking p.o. well.  WBC dropped to 7.3.  Drains were removed.  Wounds  were clean and dry.  He was discharged home to the care of his family  with a PICC line in place for IV vancomycin for 6 weeks, followed by  p.o. medication after that.   He will return to see Dr. Turner Daniels in approximately 1 week's time.   He will discontinue his Coumadin and restart aspirin.   ACTIVITY:  Partial weightbearing 20-30 pounds of weightbearing.   DIET:  Regular.   Resume home meds as per home med rec sheet.   DIAGNOSIS:  Methicillin-resistant Staphylococcus aureus infection of the  right total knee.   PROCEDURE:  While in hospital, removal of right total knee and radical  irrigation and debridement.      Laural Benes. [name].      Feliberto Gottron. Turner Daniels, M.D.  Electronically Signed    JBR/MEDQ  D:  04/06/2007  T:  04/06/2007  Job:  [id number]

## 2010-07-20 NOTE — Op Note (Signed)
NAME:  Eric Santos, Eric Santos                          ACCOUNT NO.:  [id number]   MEDICAL RECORD NO.:  [id number]                   PATIENT TYPE:  AMB   LOCATION:  ENDO                                 FACILITY:  [facility]   PHYSICIAN:  Bernette Redbird, M.D.                DATE OF BIRTH:  [birth year]   DATE OF PROCEDURE:  04/25/2003  DATE OF DISCHARGE:                                 OPERATIVE REPORT   PROCEDURE:  Upper endoscopy with biopsies.   INDICATIONS FOR PROCEDURE:  Follow up of Barrett's esophagus in a 65-year-  old with chronic reflux symptoms.  His last endoscopy was about two years  ago and showed no dysplasia.   FINDINGS:  Short segment Barrett's esophagus, roughly 3 cm.  No significant  hiatal hernia.   PROCEDURE:  The nature, purpose, and risks of the procedure were familiar to  the patient who provided written consent.  Sedation was Fentanyl 50 mcg and  Versed 5 mg IV for this procedure.  The Olympus video endoscope was passed  under direct vision without difficulty.  The esophagus was readily entered;  no obvious vocal cord lesions were observed.  The proximal esophagus was  normal, but the last several cm of the esophagus had tongues of Barrett's  mucosa of which I obtained approximately 12 biopsies.  No ring, stricture,  varices, infection, or neoplasia were observed in the esophagus and only  minimal hiatal hernia was present.  The stomach contained no significant  residual and had normal mucosa without evidence of significant gastritis,  just some streaky erythema in the proximal stomach, no erosions, ulcers,  polyps, or masses including a retroflex view of the proximal stomach.  The  pylorus, duodenal bulb, and second duodenum were unremarkable.  After  obtaining the above mentioned esophageal biopsies, the scope was removed  from the patient who tolerated the procedure well and there were no apparent  complications.   IMPRESSION:  Short to medium segment Barrett's  esophagus (530.85).   PLAN:  Await pathology results.                                               Bernette Redbird, M.D.    RB/MEDQ  D:  04/25/2003  T:  04/25/2003  Job:  [id number]   cc:   Corwin Levins, M.D. [facility]

## 2010-07-20 NOTE — Discharge Summary (Signed)
NAMEJACE, Eric Santos                ACCOUNT NO.:  [id number]   MEDICAL RECORD NO.:  [id number]          PATIENT TYPE:  INP   LOCATION:  1608                         FACILITY:  [facility]   PHYSICIAN:  Eric Santos. Eric Santos, M.D.   DATE OF BIRTH:  1945-08-17   DATE OF ADMISSION:  02/18/2007  DATE OF DISCHARGE:  02/24/2007                               DISCHARGE SUMMARY   DIAGNOSIS:  Infected right total knee.   PROCEDURE WHILE IN HOSPITAL:  Irrigation and debridement right total  knee with revision of poly spacer.   DISCHARGE SUMMARY/HISTORY OF PRESENT ILLNESS:  The patient is a 65-year-  old male well known to orthopedic surgery service having undergone total  knee arthroplasty 10 years ago by Dr. Turner Santos.  He was in his usual state  of good health up until yesterday when he began having increasing pain  in the right knee.  Upon further questioning, it is clear that he has  had some increased pain in this knee for several weeks.  The patient was  having a great deal of difficulty with range of motion as well as  inflammation and we urged him to be seen in the office due to the  symptoms he was describing.  The patient elected to defer that office  visit due to other plans in his life,  but because of increasing pain  and fever the patient presented to the emergency room on the day of  admission with a fever of 102 and the complaints of pain and swelling.  Because of the patient's signs and symptoms of infection, he was  admitted to the hospital for further care.  No known drug allergies.   MEDICAL HISTORY:  Significant for:  1. Gallbladder surgery.  2. Kidney stone surgery.  3. Total knee arthroplasty.  4. Stomach wrap for hiatal hernia.   MEDICINES AT TIME OF ADMISSION:  Lovastatin, Aleve, Claritin, Flonase  and Baby Aspirin.   SOCIAL HISTORY:  No tobacco or alcohol.   FAMILY HISTORY:  Noncontributory.   PHYSICAL EXAM ON ADMISSION:  The patient is neurovascularly intact.  HEAD:   Normocephalic, atraumatic.  EARS:  Pupils equal, round, react to light and accommodation.  NOSE:  Patent.  THROAT:  Benign.  NECK:  Supple.  LUNGS:  Clear to auscultation.  HEART:  Regular rate, rhythm.  ABDOMEN:  Soft, nontender.  EXTREMITIES:  Right lower extremity shows examination of about 0 to 25  degrees with well-healed wounds, positive warmth, no erythema, some  obvious effusion.  Left lower extremity has full range of motion without  pain.   X-RAYS:  Plain radiographs show a well fixed total knee without  effusion, no obvious acute abnormality noted on x-ray.   PREOPERATIVE LABS:  Including CBC, CMET, chest x-ray, EKG, PT and PTT  were notable for a WBC of 20.1, glucose of 127.   HOSPITAL COURSE:  On the day of admission the patient was taken to the  operating room at [facility] where he underwent irrigation and  debridement with takedown of arthrofibrosis and revision of tibial  polyethylene component of  infected right total knee arthroplasty.  Hemovac drain, double armed, was placed into the knee, Foley catheter  was placed perioperatively.  The patient was placed on postoperative  Coumadin prophylaxis, she was placed on perioperative antibiotics, she  was placed on postoperative PCA for pain control and was placed on  vancomycin for coverage of his infection until cultures taken at time of  surgery could be grown out.  Postoperative day 1, the patient was  complaining of moderate pain, T-max 99.5, dressing was dry, cultures  were not yet back with any positive results, Hemovac output 300+ mL per  shift, calves were soft, nontender, he continued with his Hemovac, Foley  was discontinued and Infectious Disease was consulted.  Later on  postoperative day 1, the patient developed a sudden onset of chest  pressure and shortness of breath, EKG revealed atrial fibrillation with  rapid ventricular response in the 180s and Dr. Myrtis Santos was kind enough to  see the patient  for his cardiac workup.  He was started on a Cardizem  drip after a bolus and admitted to telemetry as well as undergoing an  echocardiogram.  Infectious Disease felt that the patient should  continue with the vancomycin.  Postoperative day 2 the patient was awake  and alert, decreasing right total knee pain, taking p.o.'s well,  hemoglobin 11.6, WBC of 13.1, heart rate had improved to 80, INR 1.4,  drain output 50 mL with bloody fluid, continued to be worked up by  Cardiology and continued with physical therapy as tolerated by  cardiologist's suggestion.  Postoperative day 3 the patient was without  complaint, hemoglobin 11.6, WBC 13.1, urine output 60 mL, otherwise  stable orthopedically.  Report from Cardiology on postoperative day 3  indicated the patient was continuing a stable cardiac course with  pharmacological management of his atrial fibrillation.  Postoperative  day 4 the patient continued to slowly improve with decreasing drain  output and no further cardiac complaint.  Postoperative day 5 the  patient was awake and alert, decreased pain, able to ambulate well, T-  max 100.3, ranging 99, drain with less than 40 mL output of cherry red  fluid, the wound was clean and dry, cultures were growing out  methicillin-resistant Staph aureus, he continued on IV vancomycin.  Postop day 6 the patient was awake and alert, decreasing pain,  increasing function, decreasing drainage, he was otherwise stable,  status post irrigation and debridement of infected MRSA infection, right  total knee arthroplasty.  He will be discharged home to the care of his  family with IV vancomycin for the next 6 weeks per [facility],  rifampin 300 mg p.o. b.i.d., Vicodin home meds as outlined on the home  Med Rec Sheet, Vicodin for pain, return to clinic with Dr. Turner Santos in  approximately 4 days' time.  Diet is regular.  Activity is weightbearing  as tolerated with limited motion.   DIAGNOSIS FOR THIS  ADMISSION:  Infected right total knee arthroplasty.   PROCEDURE WHILE IN HOSPITAL:  I&D.      Eric Santos. Eric Santos.      Eric Santos. Eric Santos, M.D.  Electronically Signed    JBR/MEDQ  D:  04/02/2007  T:  04/03/2007  Job:  [id number]

## 2010-07-20 NOTE — Op Note (Signed)
[facility]. [facility]  Patient:    Eric Santos                        MRN: [id number] Proc. Date: 04/09/99 Adm. Date:  [id number] Attending:  Rich Brave CC:         Corwin Levins, M.D. [facility]             Haywood M. Derrell Lolling, M.D.                           Operative Report  PROCEDURE:  Upper endoscopy with biopsies.  INDICATION:  A 65 year old with longstanding Barretts esophagus, now status post surgical antireflux repair, who is approximately two years status post endoscopic evaluation showing low-grade dysplasia with a repeat endoscopy approximately a ear ago showing no evidence of dysplasia in his Barretts segment.  ENDOSCOPIST:  Florencia Reasons, M.D.  FINDINGS:  Median length segment of Barretts esophagus, with multiple biopsies taken.  The antireflux graft appeared slightly loose depsite the fact that the patient denies any significant reflux symptoms at this time  DESCRIPTION OF PROCEDURE:  The nature, purpose, risks of the procedure were familiar to the patient from prior exams, and he provided written consent. Sedation was fentanyl 50 mcg and Versed 5 mg IV without arrhythmias or desaturation.  The Olympus video endoscope was passed under direct vision. There were no obvious laryngeal abnormalities to my inspection.  The esophagus was easily entered.  The proximal esophagus was normal.  There was approximately a 4 cm segment of Barretts esophagus in the distal region of the esophagus without evidence of mass effect.  No acute esophagitis or inflammatory changes were seen nor was there any evidence of infection or varices.  Interestingly, the diaphragmatic hiatus and the region of the LES was somewhat patulous, such that I could look with the scope down into the stomach through a  wide open diaphragmatic area despite his history of a previous wrap.  The stomach contained a moderate bilious residual and had a fair amount of  punctate erythema without friability, erosions, ulcers, polyps, or masses.  Retroflexion showed again a slightly patulous diaphragmatic hiatus and a somewhat loose wrap.  The pylorus duodenal bulb and second duodenum were unremarkable.  Approximately 15 esophageal biopsies were obtained from the Barretts segment prior to removal of the scope.  The patient tolerated the procedure well, and there were no apparent complications.  IMPRESSION: 1. Medium segment Barretts esophagus, biopsied. 2. Slight looseness of the Nissen wrap. 3. Bile reflux present.  PLAN:  Await pathology on biopsies. DD:  04/09/99 TD:  04/09/99 Job: [id number]

## 2010-07-20 NOTE — Procedures (Signed)
[place]  Patient:    Eric Santos, Eric Santos                       MRN: [id number] Proc. Date: 07/11/00 Adm. Date:  [id number] Attending:  Rich Brave CC:         Corwin Levins, M.D. [facility]   Procedure Report  PROCEDURE PERFORMED:  Upper endoscopy with biopsies.  ENDOSCOPIST:  Florencia Reasons, M.D.  INDICATIONS FOR PROCEDURE:  The patient is a 65 year old with longstanding reflux status post antireflux surgery with known Barretts esophagus for dysplasia surveillance.  FINDINGS:  Medium segment Barretts esophagus with longest tongue approximately 4 to 5 cm beyond the lower esophageal sphincter.  DESCRIPTION OF PROCEDURE:  The nature, purpose and risks of the procedure were familiar to the patient, who provided written consent.  Sedation was fentanyl 50 mcg and Versed 5 mg IV without arrhythmias or desaturation.  The Olympus small caliber adult video endoscope was passed under direct vision entering the esophagus very easily.  The vocal cords appeared somewhat atrophic and irregular, but without specific mass effect or acute inflammatory changes such as laryngeal edema.  The esophagus was free of any acute inflammatory changes, masses, varices or neoplasia or infection.  The distal esophagus had a Barretts segment which was circumferential distally for a couple of centimeters and then a tongue extending about 4 cm above the lower esophageal sphincter.  Approximately ten biopsies were obtained from this Barretts area prior to the removal of the scope.  No ring or stricture was appreciated in the distal esophagus.  There was a roughly 2 cm hiatal hernia.  The stomach was entered.  It contained no significant residual and had normal mucosa including retroflex view of the proximal stomach.  No gastritis, erosions, ulcers, polyps or masses were observed.  The pylorus, duodenal bulb and second duodenum looked normal.  The patients antireflux  wrap appeared slightly patulous.  The patient tolerated the procedure well and there were no apparent complications.  IMPRESSION:  Medium segment Barretts esophagus.  PLAN:  Await path.  Continue antipeptic therapy. DD:  07/11/00 TD:  07/11/00 Job: [id number]

## 2010-07-20 NOTE — Op Note (Signed)
NAME:  LEVY, CEDANO                          ACCOUNT NO.:  [id number]   MEDICAL RECORD NO.:  [id number]                   PATIENT TYPE:  AMB   LOCATION:  ENDO                                 FACILITY:  [facility]   PHYSICIAN:  Bernette Redbird, M.D.                DATE OF BIRTH:  15-Jan-1946   DATE OF PROCEDURE:  04/25/2003  DATE OF DISCHARGE:                                 OPERATIVE REPORT   PROCEDURE:  Colonoscopy.   INDICATIONS FOR PROCEDURE:  Screening for colon cancer in a 65 year old  gentleman.   FINDINGS:  Significant sigmoid diverticulosis.   PROCEDURE:  The nature, purpose, and risks of the procedure have been  discussed with the patient who provided written consent.  Sedation for this  procedure and the upper endoscopy which preceded totaled Fentanyl 90 mcg and  Versed 9 mg IV without arrhythmias or any significant desaturations,  although he did drift down into the high 80s periodically during the  procedure.  The procedure was initiated with the Olympus adult video  colonoscope which was advanced with mild difficulty through a somewhat  fixated and angulated sigmoid region associated with a lot of diverticular  disease and ultimately, reaching the what I believe was the region of the  hepatic flexure or proximal ascending colon.  Despite multiple maneuvers and  external abdominal compression, I encountered looping and could not advance  further, so pull back was performed.  No lesions were seen during pull back  apart from very extensive sigmoid diverticulosis.  Specifically, no polyps,  cancer, or vascular malformations were seen and retroflexion in the rectum  was negative.   I then used the adjustable pediatric video colonoscope at the recommendation  of the endoscopy technician.  This was able to be advanced to the proximal  ascending colon whereupon, again at the recommendation of the technician,  the patient was placed in the supine position and the tip of the  scope  entered the cecum which was completely normal in appearance.  Pull back was  then performed.  Careful exam of the proximal ascending colon disclosed  minimal diverticulosis, nothing compared to what was seen in the sigmoid  region.  No other lesions were seen during pull out.  No biopsies were  obtained.  The patient tolerated the procedure well and there were no  apparent complications.   IMPRESSION:  1. Screening for colon cancer without significant abnormalities on this exam     (V76.51).  2. Extensive diverticulosis.  3. Technically difficult exam due to colonic redundancy and looping.   PLAN:  Consider follow up colonoscopy in five years or at least a flexible  sigmoidoscopy in five years.  Future colonoscopies might well be done under  Diprivan anesthesia.  Bernette Redbird, M.D.   RB/MEDQ  D:  04/25/2003  T:  04/25/2003  Job:  [id number]   cc:   Corwin Levins, M.D. [facility]

## 2010-08-28 DIAGNOSIS — G4733 Obstructive sleep apnea (adult) (pediatric): Secondary | ICD-10-CM

## 2010-08-29 ENCOUNTER — Ambulatory Visit (INDEPENDENT_AMBULATORY_CARE_PROVIDER_SITE_OTHER): Payer: Medicare Other | Admitting: Internal Medicine

## 2010-08-29 ENCOUNTER — Encounter: Payer: Self-pay | Admitting: Internal Medicine

## 2010-08-29 VITALS — BP 110/82 | HR 78 | Temp 97.9°F | Ht 66.0 in | Wt 266.2 lb

## 2010-08-29 DIAGNOSIS — B029 Zoster without complications: Secondary | ICD-10-CM | POA: Insufficient documentation

## 2010-08-29 DIAGNOSIS — Z0000 Encounter for general adult medical examination without abnormal findings: Secondary | ICD-10-CM | POA: Insufficient documentation

## 2010-08-29 DIAGNOSIS — E785 Hyperlipidemia, unspecified: Secondary | ICD-10-CM

## 2010-08-29 DIAGNOSIS — Z0001 Encounter for general adult medical examination with abnormal findings: Secondary | ICD-10-CM | POA: Insufficient documentation

## 2010-08-29 DIAGNOSIS — E119 Type 2 diabetes mellitus without complications: Secondary | ICD-10-CM

## 2010-08-29 MED ORDER — VALACYCLOVIR HCL 1 G PO TABS: 1000.0000 mg | ORAL_TABLET | Freq: Three times a day (TID) | ORAL | Status: AC

## 2010-08-29 NOTE — Assessment & Plan Note (Addendum)
Rather significant typical rash, but fortunately pain relatively well controlled, pt educated, reassured, given valtrex course, and asked to avoid small children or other contacts not known to have already had varicella in the past

## 2010-08-29 NOTE — Assessment & Plan Note (Signed)
stable overall by hx and exam, most recent data reviewed with pt, and pt to continue medical treatment as before  Lab Results  Component Value Date   LDLCALC 90 04/10/2010

## 2010-08-29 NOTE — Patient Instructions (Signed)
Take all new medications as prescribed Continue all other medications as before  

## 2010-08-29 NOTE — Assessment & Plan Note (Signed)
stable overall by hx and exam, most recent data reviewed with pt, and pt to continue medical treatment as before  Lab Results  Component Value Date   HGBA1C 6.1 04/10/2010
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
## 2010-10-10 ENCOUNTER — Ambulatory Visit (INDEPENDENT_AMBULATORY_CARE_PROVIDER_SITE_OTHER): Payer: Medicare Other | Admitting: Internal Medicine

## 2010-10-10 ENCOUNTER — Encounter: Payer: Self-pay | Admitting: Internal Medicine

## 2010-10-10 ENCOUNTER — Other Ambulatory Visit (INDEPENDENT_AMBULATORY_CARE_PROVIDER_SITE_OTHER): Payer: Medicare Other

## 2010-10-10 VITALS — BP 118/80 | HR 67 | Temp 98.0°F | Ht 66.0 in | Wt 264.5 lb

## 2010-10-10 DIAGNOSIS — R5381 Other malaise: Secondary | ICD-10-CM

## 2010-10-10 DIAGNOSIS — N320 Bladder-neck obstruction: Secondary | ICD-10-CM

## 2010-10-10 DIAGNOSIS — E119 Type 2 diabetes mellitus without complications: Secondary | ICD-10-CM

## 2010-10-10 DIAGNOSIS — R5383 Other fatigue: Secondary | ICD-10-CM

## 2010-10-10 DIAGNOSIS — K644 Residual hemorrhoidal skin tags: Secondary | ICD-10-CM | POA: Insufficient documentation

## 2010-10-10 LAB — BASIC METABOLIC PANEL
BUN: 19 mg/dL (ref 6–23)
CO2: 27 mEq/L (ref 19–32)
Chloride: 107 mEq/L (ref 96–112)
Glucose, Bld: 93 mg/dL (ref 70–99)
Potassium: 4.7 mEq/L (ref 3.5–5.1)
Sodium: 141 mEq/L (ref 135–145)

## 2010-10-10 LAB — URINALYSIS, ROUTINE W REFLEX MICROSCOPIC
Ketones, ur: NEGATIVE
Leukocytes, UA: NEGATIVE
Nitrite: NEGATIVE
Specific Gravity, Urine: 1.015 (ref 1.000–1.030)
Urobilinogen, UA: 0.2 (ref 0.0–1.0)

## 2010-10-10 LAB — CBC WITH DIFFERENTIAL/PLATELET
Basophils Absolute: 0 10*3/uL (ref 0.0–0.1)
HCT: 49.5 % (ref 39.0–52.0)
Lymphocytes Relative: 36.3 % (ref 12.0–46.0)
Lymphs Abs: 2.4 10*3/uL (ref 0.7–4.0)
Monocytes Relative: 11.4 % (ref 3.0–12.0)
Neutrophils Relative %: 48.1 % (ref 43.0–77.0)
Platelets: 181 10*3/uL (ref 150.0–400.0)
RDW: 13.9 % (ref 11.5–14.6)
WBC: 6.7 10*3/uL (ref 4.5–10.5)

## 2010-10-10 LAB — HEPATIC FUNCTION PANEL
ALT: 20 U/L (ref 0–53)
AST: 21 U/L (ref 0–37)
Albumin: 3.9 g/dL (ref 3.5–5.2)
Alkaline Phosphatase: 53 U/L (ref 39–117)
Total Protein: 6.3 g/dL (ref 6.0–8.3)

## 2010-10-10 LAB — TSH: TSH: 1.16 u[IU]/mL (ref 0.35–5.50)

## 2010-10-10 LAB — HEMOGLOBIN A1C: Hgb A1c MFr Bld: 5.8 % (ref 4.6–6.5)

## 2010-10-10 LAB — LIPID PANEL: Cholesterol: 139 mg/dL (ref 0–200)

## 2010-10-10 MED ORDER — HYDROCORTISONE 2.5 % RE CREA: TOPICAL_CREAM | Freq: Two times a day (BID) | RECTAL | Status: AC

## 2010-10-10 MED ORDER — FLUTICASONE PROPIONATE 50 MCG/ACT NA SUSP: 1.0000 | Freq: Two times a day (BID) | NASAL | Status: DC

## 2010-10-10 NOTE — Patient Instructions (Signed)
Take all new medications as prescribed Continue all other medications as before Please go to LAB in the Basement for the blood and/or urine tests to be done today Please call the phone number [phone] (the PhoneTree System) for results of testing in 2-3 days;  When calling, simply dial the number, and when prompted enter the MRN number above (the Medical Record Number) and the # key, then the message should start. Please return in 6 months, or sooner if needed 

## 2010-10-10 NOTE — Progress Notes (Signed)
Quick Note:  Voice message left on PhoneTree system - lab is negative, normal or otherwise stable, pt to continue same tx ______ 

## 2010-10-13 NOTE — Assessment & Plan Note (Signed)
stable overall by hx and exam, most recent data reviewed with pt, and pt to continue medical treatment as before  Lab Results  Component Value Date   HGBA1C 5.8 10/10/2010

## 2010-10-13 NOTE — Assessment & Plan Note (Signed)
stable overall by hx and exam, most recent data reviewed with pt, and pt to continue medical treatment as before  Lab Results  Component Value Date   PSA 1.03 10/10/2010   PSA 1.21 10/03/2009   PSA 1.08 01/18/2008
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
## 2010-10-13 NOTE — Assessment & Plan Note (Signed)
For topical tx,  to f/u any worsening symptoms or concerns

## 2010-10-13 NOTE — Assessment & Plan Note (Signed)
Etiology unclear, Exam otherwise benign, to check labs as documented, follow with expectant management  

## 2010-11-01 ENCOUNTER — Other Ambulatory Visit: Payer: Self-pay | Admitting: Internal Medicine

## 2010-11-22 LAB — BASIC METABOLIC PANEL WITH GFR
BUN: 13
BUN: 8
CO2: 28
CO2: 29
Calcium: 8.2 — ABNORMAL LOW
Calcium: 8.6
Chloride: 103
Chloride: 103
Creatinine, Ser: 0.76
Creatinine, Ser: 0.85
GFR calc Af Amer: >60
GFR calc Af Amer: >60
GFR calc non Af Amer: >60
GFR calc non Af Amer: >60
Glucose, Bld: 114 — ABNORMAL HIGH
Glucose, Bld: 121 — ABNORMAL HIGH
Potassium: 4.1
Potassium: 4.9
Sodium: 135
Sodium: 137

## 2010-11-22 LAB — CBC
HCT: 29.6 — ABNORMAL LOW
HCT: 30.7 — ABNORMAL LOW
HCT: 32 — ABNORMAL LOW
HCT: 32.8 — ABNORMAL LOW
Hemoglobin: 10.2 — ABNORMAL LOW
Hemoglobin: 10.2 — ABNORMAL LOW
Hemoglobin: 11.1 — ABNORMAL LOW
MCHC: 33.1
MCHC: 33.2
MCHC: 33.9
MCHC: 34.3
MCHC: 34.5
MCV: 85.2
MCV: 86.5
MCV: 87.2
Platelets: 372
Platelets: 406 — ABNORMAL HIGH
Platelets: 448 — ABNORMAL HIGH
Platelets: 458 — ABNORMAL HIGH
Platelets: 458 — ABNORMAL HIGH
RBC: 3.48 — ABNORMAL LOW
RBC: 3.73 — ABNORMAL LOW
RBC: 3.79 — ABNORMAL LOW
RDW: 13.1
RDW: 13.2
RDW: 13.2
RDW: 13.2
RDW: 13.4
RDW: 13.5
WBC: 10.8 — ABNORMAL HIGH
WBC: 8.6

## 2010-11-22 LAB — BASIC METABOLIC PANEL
BUN: 11
BUN: 9
BUN: 9
CO2: 28
CO2: 29
CO2: 29
Calcium: 8.8
Calcium: 8.8
Calcium: 8.9
Chloride: 101
Creatinine, Ser: 0.88
Creatinine, Ser: 0.91
Creatinine, Ser: 1.02
GFR calc Af Amer: >60
GFR calc non Af Amer: >60
Glucose, Bld: 106 — ABNORMAL HIGH
Glucose, Bld: 106 — ABNORMAL HIGH
Glucose, Bld: 95
Potassium: 3.8
Potassium: 3.9
Sodium: 135

## 2010-11-22 LAB — PROTIME-INR
INR: 1.2
INR: 2.1 — ABNORMAL HIGH
INR: 2.2 — ABNORMAL HIGH
Prothrombin Time: 20 — ABNORMAL HIGH
Prothrombin Time: 21.8 — ABNORMAL HIGH
Prothrombin Time: 24.3 — ABNORMAL HIGH
Prothrombin Time: 24.9 — ABNORMAL HIGH

## 2010-11-27 LAB — APTT: aPTT: 30

## 2010-11-27 LAB — BASIC METABOLIC PANEL
GFR calc non Af Amer: >60
Potassium: 4.2
Sodium: 139

## 2010-11-27 LAB — TYPE AND SCREEN: Donor AG Type: NEGATIVE

## 2010-11-27 LAB — DIFFERENTIAL
Eosinophils Relative: 4
Lymphocytes Relative: 35
Lymphs Abs: 2.7
Monocytes Absolute: 1

## 2010-11-27 LAB — CBC
HCT: 46
Hemoglobin: 15.9
Platelets: 237
WBC: 7.6

## 2010-11-27 LAB — URINALYSIS, ROUTINE W REFLEX MICROSCOPIC
Glucose, UA: NEGATIVE
Nitrite: NEGATIVE
Protein, ur: NEGATIVE

## 2010-11-27 LAB — PROTIME-INR: Prothrombin Time: 12.9

## 2010-12-07 LAB — HEPATIC FUNCTION PANEL
ALT: 15
AST: 19
Albumin: 3 — ABNORMAL LOW
Alkaline Phosphatase: 61
Total Bilirubin: 1.3 — ABNORMAL HIGH

## 2010-12-07 LAB — CARDIAC PANEL(CRET KIN+CKTOT+MB+TROPI)
CK, MB: 5 — ABNORMAL HIGH
Relative Index: 2
Relative Index: 2.3
Total CK: 181
Troponin I: 0.57

## 2010-12-07 LAB — PROTIME-INR
INR: 1.3
INR: 1.3
INR: 1.4
Prothrombin Time: 16.3 — ABNORMAL HIGH

## 2010-12-07 LAB — DIFFERENTIAL
Basophils Absolute: 0
Basophils Relative: 0
Basophils Relative: 0
Basophils Relative: 0
Basophils Relative: 1
Eosinophils Absolute: 0 — ABNORMAL LOW
Eosinophils Absolute: 0 — ABNORMAL LOW
Eosinophils Absolute: 0.4
Eosinophils Relative: 0
Monocytes Absolute: 1.3 — ABNORMAL HIGH
Monocytes Absolute: 1.7 — ABNORMAL HIGH
Monocytes Absolute: 1.1 — ABNORMAL HIGH
Monocytes Absolute: 1.4 — ABNORMAL HIGH
Monocytes Relative: 7
Monocytes Relative: 11
Monocytes Relative: 7
Neutro Abs: 10 — ABNORMAL HIGH
Neutrophils Relative %: 76
Neutrophils Relative %: 87 — ABNORMAL HIGH
Neutrophils Relative %: 61
Neutrophils Relative %: 88 — ABNORMAL HIGH

## 2010-12-07 LAB — CBC
HCT: 37 — ABNORMAL LOW
HCT: 48.1
Hemoglobin: 12.4 — ABNORMAL LOW
MCHC: 34.3
MCHC: 34.7
MCHC: 33.4
MCHC: 33.8
MCV: 85.7
MCV: 87.1
MCV: 87.8
Platelets: 216
Platelets: 562 — ABNORMAL HIGH
RBC: 4.34
RBC: 4.88
RBC: 5.48
RDW: 13.3
RDW: 13.2
WBC: 20.1 — ABNORMAL HIGH

## 2010-12-07 LAB — BODY FLUID CULTURE

## 2010-12-07 LAB — ANAEROBIC CULTURE

## 2010-12-07 LAB — APTT
aPTT: 37
aPTT: 38 — ABNORMAL HIGH

## 2010-12-07 LAB — TISSUE CULTURE

## 2010-12-07 LAB — BASIC METABOLIC PANEL
BUN: 14
BUN: 11
CO2: 30
CO2: 27
Calcium: 8.2 — ABNORMAL LOW
Calcium: 8.3 — ABNORMAL LOW
Calcium: 9.2
Chloride: 103
Creatinine, Ser: 0.9
Creatinine, Ser: 0.94
GFR calc Af Amer: >60
GFR calc non Af Amer: >60
Glucose, Bld: 117 — ABNORMAL HIGH
Glucose, Bld: 94
Sodium: 137

## 2010-12-07 LAB — CREATININE, SERUM: GFR calc Af Amer: >60

## 2010-12-07 LAB — URINALYSIS, ROUTINE W REFLEX MICROSCOPIC
Bilirubin Urine: NEGATIVE
Hgb urine dipstick: NEGATIVE
Ketones, ur: NEGATIVE
Specific Gravity, Urine: 1.025
Urobilinogen, UA: 0.2

## 2010-12-07 LAB — I-STAT 8, (EC8 V) (CONVERTED LAB)
Bicarbonate: 24.9 — ABNORMAL HIGH
Glucose, Bld: 142 — ABNORMAL HIGH
Sodium: 139
TCO2: 26
pCO2, Ven: 41.9 — ABNORMAL LOW
pH, Ven: 7.383 — ABNORMAL HIGH

## 2010-12-07 LAB — SYNOVIAL CELL COUNT + DIFF, W/ CRYSTALS
Eosinophils-Synovial: 1
Lymphocytes-Synovial Fld: 1
Monocyte-Macrophage-Synovial Fluid: 4 — ABNORMAL LOW

## 2010-12-07 LAB — URINE CULTURE: Special Requests: NEGATIVE

## 2010-12-07 LAB — SEDIMENTATION RATE: Sed Rate: 8

## 2011-04-16 ENCOUNTER — Encounter: Payer: Self-pay | Admitting: Internal Medicine

## 2011-04-16 ENCOUNTER — Ambulatory Visit (INDEPENDENT_AMBULATORY_CARE_PROVIDER_SITE_OTHER): Payer: Medicare Other | Admitting: Internal Medicine

## 2011-04-16 VITALS — BP 102/80 | HR 80 | Temp 98.1°F | Ht 67.0 in | Wt 269.2 lb

## 2011-04-16 DIAGNOSIS — J309 Allergic rhinitis, unspecified: Secondary | ICD-10-CM

## 2011-04-16 DIAGNOSIS — Z01818 Encounter for other preprocedural examination: Secondary | ICD-10-CM | POA: Insufficient documentation

## 2011-04-16 DIAGNOSIS — E785 Hyperlipidemia, unspecified: Secondary | ICD-10-CM

## 2011-04-16 DIAGNOSIS — E119 Type 2 diabetes mellitus without complications: Secondary | ICD-10-CM

## 2011-04-16 NOTE — Assessment & Plan Note (Addendum)
Likely for right knee f/u surgury soon per Dr Orlan Leavens, ECG reviewed as per emr, o/w stable overall by hx and exam, most recent data reviewed with pt, and pt to continue medical treatment as before, ok for surgury in next 3-6 months if needed for right knee pending stress test evaluation as well - will order
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
## 2011-04-16 NOTE — Assessment & Plan Note (Signed)
stable overall by hx and exam, most recent data reviewed with pt, and pt to continue medical treatment as before  Lab Results  Component Value Date   LDLCALC 81 10/10/2010

## 2011-04-16 NOTE — Patient Instructions (Signed)
Your EKG was ok today Continue all other medications as before No blood work needed today You will be contacted regarding the referral for: stress test (no walking involved) Please keep your appointments with your specialists as you have planned - Dr Matthias Hughs, and Dr Turner Daniels Please return in 6 months, or sooner if needed

## 2011-04-16 NOTE — Assessment & Plan Note (Signed)
stable overall by hx and exam, most recent data reviewed with pt, and pt to continue medical treatment as before  Lab Results  Component Value Date   HGBA1C 5.8 10/10/2010    

## 2011-04-16 NOTE — Assessment & Plan Note (Signed)
To re-start the flonase,  to f/u any worsening symptoms or concerns

## 2011-04-25 ENCOUNTER — Other Ambulatory Visit (HOSPITAL_COMMUNITY): Payer: Medicare Other | Admitting: Radiology

## 2011-04-29 ENCOUNTER — Ambulatory Visit (HOSPITAL_COMMUNITY): Payer: Medicare Other

## 2011-05-13 ENCOUNTER — Other Ambulatory Visit (HOSPITAL_COMMUNITY): Payer: Medicare Other

## 2011-05-16 ENCOUNTER — Ambulatory Visit (HOSPITAL_COMMUNITY): Payer: Medicare Other | Attending: Cardiology | Admitting: Radiology

## 2011-05-16 VITALS — BP 120/93 | Ht 67.0 in | Wt 268.0 lb

## 2011-05-16 DIAGNOSIS — I4891 Unspecified atrial fibrillation: Secondary | ICD-10-CM | POA: Insufficient documentation

## 2011-05-16 DIAGNOSIS — R0989 Other specified symptoms and signs involving the circulatory and respiratory systems: Secondary | ICD-10-CM | POA: Insufficient documentation

## 2011-05-16 DIAGNOSIS — R0609 Other forms of dyspnea: Secondary | ICD-10-CM | POA: Insufficient documentation

## 2011-05-16 DIAGNOSIS — E669 Obesity, unspecified: Secondary | ICD-10-CM | POA: Insufficient documentation

## 2011-05-16 DIAGNOSIS — I10 Essential (primary) hypertension: Secondary | ICD-10-CM | POA: Insufficient documentation

## 2011-05-16 DIAGNOSIS — Z01818 Encounter for other preprocedural examination: Secondary | ICD-10-CM

## 2011-05-16 DIAGNOSIS — I4949 Other premature depolarization: Secondary | ICD-10-CM

## 2011-05-16 DIAGNOSIS — Z8249 Family history of ischemic heart disease and other diseases of the circulatory system: Secondary | ICD-10-CM | POA: Insufficient documentation

## 2011-05-16 DIAGNOSIS — E119 Type 2 diabetes mellitus without complications: Secondary | ICD-10-CM | POA: Insufficient documentation

## 2011-05-16 DIAGNOSIS — R5383 Other fatigue: Secondary | ICD-10-CM | POA: Insufficient documentation

## 2011-05-16 DIAGNOSIS — Z01810 Encounter for preprocedural cardiovascular examination: Secondary | ICD-10-CM | POA: Insufficient documentation

## 2011-05-16 DIAGNOSIS — R0602 Shortness of breath: Secondary | ICD-10-CM

## 2011-05-16 DIAGNOSIS — E785 Hyperlipidemia, unspecified: Secondary | ICD-10-CM | POA: Insufficient documentation

## 2011-05-16 DIAGNOSIS — R5381 Other malaise: Secondary | ICD-10-CM | POA: Insufficient documentation

## 2011-05-16 HISTORY — PX: CARDIOVASCULAR STRESS TEST: SHX262

## 2011-05-16 MED ORDER — REGADENOSON 0.4 MG/5ML IV SOLN
0.4000 mg | Freq: Once | INTRAVENOUS | Status: AC
  Administered 2011-05-16: 0.4 mg via INTRAVENOUS

## 2011-05-16 MED ORDER — TECHNETIUM TC 99M TETROFOSMIN IV KIT
33.0000 | PACK | Freq: Once | INTRAVENOUS | Status: AC | PRN
  Administered 2011-05-16: 33 via INTRAVENOUS

## 2011-05-16 MED ORDER — TECHNETIUM TC 99M TETROFOSMIN IV KIT
11.0000 | PACK | Freq: Once | INTRAVENOUS | Status: AC | PRN
  Administered 2011-05-16: 11 via INTRAVENOUS

## 2011-05-16 NOTE — Progress Notes (Signed)
[facility] SITE 3 NUCLEAR MED [address] [place] [address] [phone]  Cardiology Nuclear Med Study  Eric Santos is a 66 y.o. male [id number] 1945-08-11   Nuclear Med Background Indication for Stress Test:  Evaluation for Ischemia and Pending Clearance for (R) Knee Surgery History:  '99 OZH:YQMVHQ, EF=72%; '08 Echo:Normal LVF; Post-op afib. Cardiac Risk Factors: Family History - CAD, Hypertension, Lipids, NIDDM and Obesity  Symptoms:  DOE and Fatigue   Nuclear Pre-Procedure Caffeine/Decaff Intake:  None NPO After: 6pm   Lungs:  Clear.  O2 SAT 94% on RA. IV 0.9% NS with Angio Cath:  20g  IV Site: R Hand  IV Started by:  Bonnita Levan, RN  Chest Size (in):  50 Cup Size: n/a  Height: 5\' 7"  (1.702 m)  Weight:  268 lb (121.564 kg)  BMI:  Body mass index is 41.97 kg/(m^2). Tech Comments:  N/A    Nuclear Med Study 1 or 2 day study: 1 day  Stress Test Type:  Lexiscan  Reading MD: Olga Millers, MD  Order Authorizing Provider:  Oliver Barre, MD  Resting Radionuclide: Technetium 71m Tetrofosmin  Resting Radionuclide Dose: 11.0 mCi   Stress Radionuclide:  Technetium 13m Tetrofosmin  Stress Radionuclide Dose: 32.7 mCi           Stress Protocol Rest HR: 73 Stress HR: 98  Rest BP: 120/93 Stress BP: 118/94  Exercise Time (min): n/a METS: n/a   Predicted Max HR: 156 bpm % Max HR: 62.82 bpm Rate Pressure Product: 46962   Dose of Adenosine (mg):  n/a Dose of Lexiscan: 0.4 mg  Dose of Atropine (mg): n/a Dose of Dobutamine: n/a mcg/kg/min (at max HR)  Stress Test Technologist: Smiley Houseman, CMA-N  Nuclear Technologist:  Domenic Polite, CNMT     Rest Procedure:  Myocardial perfusion imaging was performed at rest 45 minutes following the intravenous administration of Technetium 22m Tetrofosmin.  Rest ECG: Nonspecific T-wave changes with occasional PVC's.  Stress Procedure:  The patient received IV Lexiscan 0.4 mg over 15-seconds.  Technetium 9m  Tetrofosmin injected at 30-seconds.  There were no significant changes with Lexiscan, occasional PVC's were noted.  Quantitative spect images were obtained after a 45 minute delay.  Stress ECG: No significant ST segment change suggestive of ischemia.  QPS Raw Data Images:  Acquisition technically good; normal left ventricular size. Stress Images:  Normal homogeneous uptake in all areas of the myocardium. Rest Images:  Normal homogeneous uptake in all areas of the myocardium. Subtraction (SDS):  No evidence of ischemia. Transient Ischemic Dilatation (Normal <1.22):  0.99 Lung/Heart Ratio (Normal <0.45):  0.47  Quantitative Gated Spect Images QGS EDV:  81 ml QGS ESV:  25 ml QGS cine images:  NL LV Function; NL Wall Motion QGS EF: 69%  Impression Exercise Capacity:  Lexiscan with no exercise. BP Response:  Normal blood pressure response. Clinical Symptoms:  There is dyspnea. ECG Impression:  No significant ST segment change suggestive of ischemia. Comparison with Prior Nuclear Study: No significant change from previous study  Overall Impression:  Normal stress nuclear study.   Olga Millers

## 2011-05-17 ENCOUNTER — Encounter: Payer: Self-pay | Admitting: Internal Medicine

## 2011-10-15 ENCOUNTER — Other Ambulatory Visit (INDEPENDENT_AMBULATORY_CARE_PROVIDER_SITE_OTHER): Payer: Medicare Other

## 2011-10-15 ENCOUNTER — Encounter: Payer: Self-pay | Admitting: Internal Medicine

## 2011-10-15 ENCOUNTER — Ambulatory Visit (INDEPENDENT_AMBULATORY_CARE_PROVIDER_SITE_OTHER): Payer: Medicare Other | Admitting: Internal Medicine

## 2011-10-15 VITALS — BP 118/80 | HR 70 | Temp 97.0°F | Ht 67.0 in | Wt 264.0 lb

## 2011-10-15 DIAGNOSIS — E119 Type 2 diabetes mellitus without complications: Secondary | ICD-10-CM

## 2011-10-15 DIAGNOSIS — R5381 Other malaise: Secondary | ICD-10-CM

## 2011-10-15 DIAGNOSIS — C44319 Basal cell carcinoma of skin of other parts of face: Secondary | ICD-10-CM | POA: Insufficient documentation

## 2011-10-15 DIAGNOSIS — R5383 Other fatigue: Secondary | ICD-10-CM

## 2011-10-15 DIAGNOSIS — N320 Bladder-neck obstruction: Secondary | ICD-10-CM

## 2011-10-15 DIAGNOSIS — E785 Hyperlipidemia, unspecified: Secondary | ICD-10-CM

## 2011-10-15 HISTORY — DX: Hyperlipidemia, unspecified: E78.5

## 2011-10-15 LAB — CBC WITH DIFFERENTIAL/PLATELET
Basophils Relative: 0.3 % (ref 0.0–3.0)
Eosinophils Absolute: 0.3 10*3/uL (ref 0.0–0.7)
Hemoglobin: 16.2 g/dL (ref 13.0–17.0)
Lymphocytes Relative: 30 % (ref 12.0–46.0)
MCHC: 32.8 g/dL (ref 30.0–36.0)
MCV: 91.2 fl (ref 78.0–100.0)
Neutro Abs: 4.1 10*3/uL (ref 1.4–7.7)
RBC: 5.42 Mil/uL (ref 4.22–5.81)

## 2011-10-15 LAB — HEPATIC FUNCTION PANEL
Bilirubin, Direct: 0.1 mg/dL (ref 0.0–0.3)
Total Bilirubin: 0.8 mg/dL (ref 0.3–1.2)
Total Protein: 6.3 g/dL (ref 6.0–8.3)

## 2011-10-15 LAB — LIPID PANEL
HDL: 36.3 mg/dL — ABNORMAL LOW (ref >39.00)
LDL Cholesterol: 80 mg/dL (ref 0–99)
Total CHOL/HDL Ratio: 4
Triglycerides: 133 mg/dL (ref 0.0–149.0)
VLDL: 26.6 mg/dL (ref 0.0–40.0)

## 2011-10-15 LAB — BASIC METABOLIC PANEL
Calcium: 9.1 mg/dL (ref 8.4–10.5)
Creatinine, Ser: 0.6 mg/dL (ref 0.4–1.5)
GFR: 138.46 mL/min (ref >60.00)

## 2011-10-15 LAB — URINALYSIS, ROUTINE W REFLEX MICROSCOPIC
Hgb urine dipstick: NEGATIVE
Total Protein, Urine: NEGATIVE
Urine Glucose: NEGATIVE

## 2011-10-15 MED ORDER — FLUTICASONE PROPIONATE 50 MCG/ACT NA SUSP: 1.0000 | Freq: Two times a day (BID) | NASAL | Status: DC

## 2011-10-15 MED ORDER — CYCLOBENZAPRINE HCL 5 MG PO TABS: 5.0000 mg | ORAL_TABLET | Freq: Three times a day (TID) | ORAL | Status: DC | PRN

## 2011-10-15 NOTE — Assessment & Plan Note (Signed)
stable overall by hx and exam, most recent data reviewed with pt, and pt to continue medical treatment as before Lab Results  Component Value Date   LDLCALC 81 10/10/2010   For lab today, cont meds as is
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
## 2011-10-15 NOTE — Patient Instructions (Addendum)
Continue all other medications as before Your medications were refilled as requested today Please go to LAB in the Basement for the blood and/or urine tests to be done today You will be contacted by phone if any changes need to be made immediately.  Otherwise, you will receive a letter about your results with an explanation Please keep your appointments with your specialists as you have planned - Dr Turner Daniels Your Stress Test results were faxed Please call if you would like referral back to Pulmonary for sleep apnea follow up evaluation and treatment Please return in 1 year for your yearly visit, or sooner if needed

## 2011-10-15 NOTE — Assessment & Plan Note (Signed)
Also due for PSA - will order 

## 2011-10-15 NOTE — Assessment & Plan Note (Signed)
Possibly related to untreated OSA, for labs today, to f/u any worsening symptoms or concerns

## 2011-10-15 NOTE — Assessment & Plan Note (Addendum)
stable overall by hx and exam, most recent data reviewed with pt, and pt to continue medical treatment as before  Lab Results  Component Value Date   HGBA1C 5.8 10/10/2010    

## 2011-11-28 ENCOUNTER — Other Ambulatory Visit: Payer: Self-pay | Admitting: Orthopedic Surgery

## 2011-12-10 ENCOUNTER — Encounter (HOSPITAL_COMMUNITY): Payer: Self-pay | Admitting: Pharmacy Technician

## 2011-12-17 ENCOUNTER — Ambulatory Visit (HOSPITAL_COMMUNITY)
Admission: RE | Admit: 2011-12-17 | Discharge: 2011-12-17 | Disposition: A | Discharge status: Home / Self Care | Payer: Medicare Other | Source: Ambulatory Visit | Attending: Orthopedic Surgery | Admitting: Orthopedic Surgery

## 2011-12-17 ENCOUNTER — Encounter (HOSPITAL_COMMUNITY)
Admission: RE | Admit: 2011-12-17 | Discharge: 2011-12-17 | Disposition: A | Discharge status: Home / Self Care | Payer: Medicare Other | Source: Ambulatory Visit | Attending: Orthopedic Surgery | Admitting: Orthopedic Surgery

## 2011-12-17 ENCOUNTER — Encounter (HOSPITAL_COMMUNITY): Payer: Self-pay

## 2011-12-17 DIAGNOSIS — Z01812 Encounter for preprocedural laboratory examination: Secondary | ICD-10-CM | POA: Insufficient documentation

## 2011-12-17 DIAGNOSIS — Z01818 Encounter for other preprocedural examination: Secondary | ICD-10-CM | POA: Insufficient documentation

## 2011-12-17 DIAGNOSIS — K449 Diaphragmatic hernia without obstruction or gangrene: Secondary | ICD-10-CM | POA: Insufficient documentation

## 2011-12-17 LAB — URINALYSIS, ROUTINE W REFLEX MICROSCOPIC
Bilirubin Urine: NEGATIVE
Ketones, ur: NEGATIVE mg/dL
Nitrite: NEGATIVE
Protein, ur: NEGATIVE mg/dL

## 2011-12-17 LAB — BASIC METABOLIC PANEL
BUN: 13 mg/dL (ref 6–23)
Chloride: 105 mEq/L (ref 96–112)
Creatinine, Ser: 0.78 mg/dL (ref 0.50–1.35)
GFR calc Af Amer: >90 mL/min (ref >90)
GFR calc non Af Amer: >90 mL/min (ref >90)
Glucose, Bld: 107 mg/dL — ABNORMAL HIGH (ref 70–99)

## 2011-12-17 LAB — CBC WITH DIFFERENTIAL/PLATELET
Basophils Absolute: 0.1 10*3/uL (ref 0.0–0.1)
Basophils Relative: 1 % (ref 0–1)
Eosinophils Absolute: 0.2 10*3/uL (ref 0.0–0.7)
HCT: 48.3 % (ref 39.0–52.0)
MCH: 31.1 pg (ref 26.0–34.0)
MCHC: 35.2 g/dL (ref 30.0–36.0)
Monocytes Absolute: 0.7 10*3/uL (ref 0.1–1.0)
Neutro Abs: 3.7 10*3/uL (ref 1.7–7.7)
RDW: 13.3 % (ref 11.5–15.5)

## 2011-12-17 NOTE — Progress Notes (Signed)
Per blood bank patient needs new type and screen DOS due to positive antibody screen during PAT

## 2011-12-17 NOTE — Pre-Procedure Instructions (Signed)
20 KIRON OSMUN  12/17/2011   Your procedure is scheduled on:  October 21  Report to [facility] at 05:30 AM.  Call this number if you have problems the morning of surgery: [phone]   Remember:   Do not eat or drink:After Midnight.  Take these medicines the morning of surgery with A SIP OF WATER: Flonase, Oxycodone, Zyrtec   Stop Aspirin, Aleve, Multiple Vitamins after today  Do not wear jewelry, make-up or nail polish.  Do not wear lotions, powders, or perfumes. You may wear deodorant.  Do not shave 48 hours prior to surgery. Men may shave face and neck.  Do not bring valuables to the hospital.  Contacts, dentures or bridgework may not be worn into surgery.  Leave suitcase in the car. After surgery it may be brought to your room.  For patients admitted to the hospital, checkout time is 11:00 AM the day of discharge.   Special Instructions: Incentive Spirometry - Practice and bring it with you on the day of surgery. Shower using CHG 2 nights before surgery and the night before surgery.  If you shower the day of surgery use CHG.  Use special wash - you have one bottle of CHG for all showers.  You should use approximately 1/3 of the bottle for each shower.   Please read over the following fact sheets that you were given: Pain Booklet, Coughing and Deep Breathing, Blood Transfusion Information, MRSA Information and Surgical Site Infection Prevention
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
## 2011-12-22 MED ORDER — CEFAZOLIN SODIUM-DEXTROSE 2-3 GM-% IV SOLR
2.0000 g | INTRAVENOUS | Status: AC
  Administered 2011-12-23: 2 g via INTRAVENOUS
  Filled 2011-12-22: qty 50

## 2011-12-22 MED ORDER — CHLORHEXIDINE GLUCONATE 4 % EX LIQD: 60.0000 mL | Freq: Once | CUTANEOUS | Status: DC

## 2011-12-22 MED ORDER — DEXTROSE-NACL 5-0.45 % IV SOLN: INTRAVENOUS | Status: DC

## 2011-12-23 ENCOUNTER — Encounter (HOSPITAL_COMMUNITY): Payer: Self-pay | Admitting: Vascular Surgery

## 2011-12-23 ENCOUNTER — Inpatient Hospital Stay (HOSPITAL_COMMUNITY): Payer: Medicare Other

## 2011-12-23 ENCOUNTER — Inpatient Hospital Stay (HOSPITAL_COMMUNITY)
Admission: RE | Admit: 2011-12-23 | Discharge: 2011-12-25 | DRG: 468 | Disposition: A | Discharge status: Home / Self Care | Payer: Medicare Other | Source: Ambulatory Visit | Attending: Orthopedic Surgery | Admitting: Orthopedic Surgery

## 2011-12-23 ENCOUNTER — Encounter (HOSPITAL_COMMUNITY): Payer: Self-pay | Admitting: *Deleted

## 2011-12-23 ENCOUNTER — Inpatient Hospital Stay (HOSPITAL_COMMUNITY): Payer: Medicare Other | Admitting: Vascular Surgery

## 2011-12-23 ENCOUNTER — Encounter (HOSPITAL_COMMUNITY)
Admission: RE | Disposition: A | Discharge status: Home / Self Care | Payer: Self-pay | Source: Ambulatory Visit | Attending: Orthopedic Surgery

## 2011-12-23 DIAGNOSIS — T8489XA Other specified complication of internal orthopedic prosthetic devices, implants and grafts, initial encounter: Principal | ICD-10-CM | POA: Diagnosis present

## 2011-12-23 DIAGNOSIS — Y849 Medical procedure, unspecified as the cause of abnormal reaction of the patient, or of later complication, without mention of misadventure at the time of the procedure: Secondary | ICD-10-CM | POA: Diagnosis present

## 2011-12-23 DIAGNOSIS — E119 Type 2 diabetes mellitus without complications: Secondary | ICD-10-CM | POA: Diagnosis present

## 2011-12-23 DIAGNOSIS — Z8614 Personal history of Methicillin resistant Staphylococcus aureus infection: Secondary | ICD-10-CM

## 2011-12-23 DIAGNOSIS — E785 Hyperlipidemia, unspecified: Secondary | ICD-10-CM | POA: Diagnosis present

## 2011-12-23 DIAGNOSIS — G4733 Obstructive sleep apnea (adult) (pediatric): Secondary | ICD-10-CM | POA: Diagnosis present

## 2011-12-23 DIAGNOSIS — Z8589 Personal history of malignant neoplasm of other organs and systems: Secondary | ICD-10-CM

## 2011-12-23 DIAGNOSIS — Z96659 Presence of unspecified artificial knee joint: Secondary | ICD-10-CM

## 2011-12-23 DIAGNOSIS — T84012A Broken internal right knee prosthesis, initial encounter: Secondary | ICD-10-CM

## 2011-12-23 DIAGNOSIS — M25569 Pain in unspecified knee: Secondary | ICD-10-CM | POA: Diagnosis present

## 2011-12-23 HISTORY — DX: Personal history of other diseases of the digestive system: Z87.19

## 2011-12-23 HISTORY — PX: TOTAL KNEE REVISION: SHX996

## 2011-12-23 LAB — TYPE AND SCREEN
ABO/RH(D): A POS
Antibody Screen: POSITIVE

## 2011-12-23 LAB — GRAM STAIN

## 2011-12-23 SURGERY — TOTAL KNEE REVISION
Anesthesia: General | Site: Knee | Laterality: Right | Wound class: Clean

## 2011-12-23 MED ORDER — FENTANYL CITRATE 0.05 MG/ML IJ SOLN
INTRAMUSCULAR | Status: DC | PRN
  Administered 2011-12-23 (×5): 50 ug via INTRAVENOUS

## 2011-12-23 MED ORDER — MAGNESIUM HYDROXIDE 400 MG/5ML PO SUSP
30.0000 mL | Freq: Every day | ORAL | Status: DC | PRN
  Administered 2011-12-24: 30 mL via ORAL
  Filled 2011-12-23: qty 30

## 2011-12-23 MED ORDER — OXYCODONE HCL 5 MG PO TABS: 5.0000 mg | ORAL_TABLET | Freq: Once | ORAL | Status: DC | PRN

## 2011-12-23 MED ORDER — ACETAMINOPHEN 325 MG PO TABS: 650.0000 mg | ORAL_TABLET | Freq: Four times a day (QID) | ORAL | Status: DC | PRN

## 2011-12-23 MED ORDER — PNEUMOCOCCAL VAC POLYVALENT 25 MCG/0.5ML IJ INJ
0.5000 mL | INJECTION | INTRAMUSCULAR | Status: DC
  Filled 2011-12-23: qty 0.5

## 2011-12-23 MED ORDER — ONDANSETRON HCL 4 MG PO TABS: 4.0000 mg | ORAL_TABLET | Freq: Four times a day (QID) | ORAL | Status: DC | PRN

## 2011-12-23 MED ORDER — MIDAZOLAM HCL 5 MG/5ML IJ SOLN
INTRAMUSCULAR | Status: DC | PRN
  Administered 2011-12-23: 2 mg via INTRAVENOUS

## 2011-12-23 MED ORDER — LACTATED RINGERS IV SOLN
INTRAVENOUS | Status: DC | PRN
  Administered 2011-12-23 (×2): via INTRAVENOUS

## 2011-12-23 MED ORDER — DIPHENHYDRAMINE HCL 12.5 MG/5ML PO ELIX: 12.5000 mg | ORAL_SOLUTION | ORAL | Status: DC | PRN

## 2011-12-23 MED ORDER — INFLUENZA VIRUS VACC SPLIT PF IM SUSP
0.5000 mL | INTRAMUSCULAR | Status: AC
  Administered 2011-12-24: 0.5 mL via INTRAMUSCULAR
  Filled 2011-12-23: qty 0.5

## 2011-12-23 MED ORDER — OXYCODONE HCL 5 MG PO TABS
5.0000 mg | ORAL_TABLET | ORAL | Status: DC | PRN
Start: 2011-12-23 — End: 2011-12-25
  Administered 2011-12-23 (×3): 5 mg via ORAL
  Administered 2011-12-24: 10 mg via ORAL
  Administered 2011-12-25: 5 mg via ORAL
  Filled 2011-12-23 (×2): qty 1
  Filled 2011-12-23: qty 2
  Filled 2011-12-23: qty 1
  Filled 2011-12-23: qty 2

## 2011-12-23 MED ORDER — SODIUM CHLORIDE 0.9 % IR SOLN
Status: DC | PRN
  Administered 2011-12-23: 3000 mL

## 2011-12-23 MED ORDER — FLEET ENEMA 7-19 GM/118ML RE ENEM: 1.0000 | ENEMA | Freq: Once | RECTAL | Status: AC | PRN

## 2011-12-23 MED ORDER — MENTHOL 3 MG MT LOZG: 1.0000 | LOZENGE | OROMUCOSAL | Status: DC | PRN

## 2011-12-23 MED ORDER — ONDANSETRON HCL 4 MG/2ML IJ SOLN
INTRAMUSCULAR | Status: DC | PRN
  Administered 2011-12-23: 4 mg via INTRAVENOUS

## 2011-12-23 MED ORDER — ACETAMINOPHEN 650 MG RE SUPP: 650.0000 mg | Freq: Four times a day (QID) | RECTAL | Status: DC | PRN

## 2011-12-23 MED ORDER — BISACODYL 5 MG PO TBEC: 5.0000 mg | DELAYED_RELEASE_TABLET | Freq: Every day | ORAL | Status: DC | PRN

## 2011-12-23 MED ORDER — ACETAMINOPHEN 10 MG/ML IV SOLN
1000.0000 mg | Freq: Four times a day (QID) | INTRAVENOUS | Status: AC
  Administered 2011-12-23 – 2011-12-24 (×4): 1000 mg via INTRAVENOUS
  Filled 2011-12-23 (×5): qty 100

## 2011-12-23 MED ORDER — KCL IN DEXTROSE-NACL 20-5-0.45 MEQ/L-%-% IV SOLN
INTRAVENOUS | Status: DC
Start: 2011-12-23 — End: 2011-12-25
  Administered 2011-12-23: 21:00:00 via INTRAVENOUS
  Filled 2011-12-23 (×8): qty 1000

## 2011-12-23 MED ORDER — SIMVASTATIN 10 MG PO TABS
10.0000 mg | ORAL_TABLET | Freq: Every day | ORAL | Status: DC
  Administered 2011-12-23 – 2011-12-24 (×2): 10 mg via ORAL
  Filled 2011-12-23 (×3): qty 1

## 2011-12-23 MED ORDER — HYDROMORPHONE HCL PF 1 MG/ML IJ SOLN
INTRAMUSCULAR | Status: AC
  Filled 2011-12-23: qty 1

## 2011-12-23 MED ORDER — ZOLPIDEM TARTRATE 5 MG PO TABS: 5.0000 mg | ORAL_TABLET | Freq: Every evening | ORAL | Status: DC | PRN

## 2011-12-23 MED ORDER — HYDROMORPHONE HCL PF 1 MG/ML IJ SOLN: 1.0000 mg | INTRAMUSCULAR | Status: DC | PRN

## 2011-12-23 MED ORDER — METOCLOPRAMIDE HCL 10 MG PO TABS: 5.0000 mg | ORAL_TABLET | Freq: Three times a day (TID) | ORAL | Status: DC | PRN

## 2011-12-23 MED ORDER — CELECOXIB 200 MG PO CAPS
200.0000 mg | ORAL_CAPSULE | Freq: Two times a day (BID) | ORAL | Status: DC
  Administered 2011-12-23 – 2011-12-25 (×5): 200 mg via ORAL
  Filled 2011-12-23 (×6): qty 1

## 2011-12-23 MED ORDER — METOCLOPRAMIDE HCL 5 MG/ML IJ SOLN
5.0000 mg | Freq: Three times a day (TID) | INTRAMUSCULAR | Status: DC | PRN
Start: 2011-12-23 — End: 2011-12-25

## 2011-12-23 MED ORDER — PHENOL 1.4 % MT LIQD: 1.0000 | OROMUCOSAL | Status: DC | PRN

## 2011-12-23 MED ORDER — HYDROMORPHONE HCL PF 1 MG/ML IJ SOLN
0.2500 mg | INTRAMUSCULAR | Status: DC | PRN
  Administered 2011-12-23 (×2): 0.5 mg via INTRAVENOUS

## 2011-12-23 MED ORDER — BUPIVACAINE-EPINEPHRINE PF 0.5-1:200000 % IJ SOLN
INTRAMUSCULAR | Status: DC | PRN
  Administered 2011-12-23: 30 mL

## 2011-12-23 MED ORDER — ONDANSETRON HCL 4 MG/2ML IJ SOLN: 4.0000 mg | Freq: Four times a day (QID) | INTRAMUSCULAR | Status: DC | PRN

## 2011-12-23 MED ORDER — OXYCODONE HCL 5 MG/5ML PO SOLN: 5.0000 mg | Freq: Once | ORAL | Status: DC | PRN

## 2011-12-23 MED ORDER — FLUTICASONE PROPIONATE 50 MCG/ACT NA SUSP
1.0000 | Freq: Two times a day (BID) | NASAL | Status: DC
  Administered 2011-12-23 – 2011-12-25 (×4): 1 via NASAL
  Filled 2011-12-23: qty 16

## 2011-12-23 MED ORDER — ALUM & MAG HYDROXIDE-SIMETH 200-200-20 MG/5ML PO SUSP: 30.0000 mL | ORAL | Status: DC | PRN

## 2011-12-23 MED ORDER — CYCLOBENZAPRINE HCL 10 MG PO TABS: 5.0000 mg | ORAL_TABLET | Freq: Three times a day (TID) | ORAL | Status: DC | PRN

## 2011-12-23 MED ORDER — ASPIRIN EC 325 MG PO TBEC
325.0000 mg | DELAYED_RELEASE_TABLET | Freq: Two times a day (BID) | ORAL | Status: DC
  Administered 2011-12-23 – 2011-12-25 (×5): 325 mg via ORAL
  Filled 2011-12-23 (×6): qty 1

## 2011-12-23 MED ORDER — PROPOFOL 10 MG/ML IV BOLUS
INTRAVENOUS | Status: DC | PRN
  Administered 2011-12-23: 200 mg via INTRAVENOUS

## 2011-12-23 MED ORDER — LORATADINE 10 MG PO TABS
10.0000 mg | ORAL_TABLET | Freq: Every day | ORAL | Status: DC
  Administered 2011-12-23 – 2011-12-25 (×3): 10 mg via ORAL
  Filled 2011-12-23 (×3): qty 1

## 2011-12-23 SURGICAL SUPPLY — 62 items
BANDAGE ELASTIC 4 VELCRO ST LF (GAUZE/BANDAGES/DRESSINGS) ×2 IMPLANT
BANDAGE ELASTIC 6 VELCRO ST LF (GAUZE/BANDAGES/DRESSINGS) ×2 IMPLANT
BANDAGE ESMARK 6X9 LF (GAUZE/BANDAGES/DRESSINGS) ×1 IMPLANT
BLADE SAG 18X100X1.27 (BLADE) ×3 IMPLANT
BLADE SAW SAG 90X13X1.27 (BLADE) ×3 IMPLANT
BLADE SAW SGTL 81X20 HD (BLADE) ×1 IMPLANT
BLADE SURG ROTATE 9660 (MISCELLANEOUS) IMPLANT
BNDG CMPR 9X6 STRL LF SNTH (GAUZE/BANDAGES/DRESSINGS) ×1
BNDG ESMARK 6X9 LF (GAUZE/BANDAGES/DRESSINGS) ×2
BOWL SMART MIX CTS (DISPOSABLE) IMPLANT
BUR EGG ELITE 4.0 (BURR) IMPLANT
BUR ROUND FLUTED 4 SOFT TCH (BURR) IMPLANT
CLOTH BEACON ORANGE TIMEOUT ST (SAFETY) ×2 IMPLANT
COVER BACK TABLE 24X17X13 BIG (DRAPES) IMPLANT
COVER SURGICAL LIGHT HANDLE (MISCELLANEOUS) ×2 IMPLANT
CUFF TOURNIQUET SINGLE 34IN LL (TOURNIQUET CUFF) IMPLANT
CUFF TOURNIQUET SINGLE 44IN (TOURNIQUET CUFF) IMPLANT
DISC DIAMOND MED (BURR) IMPLANT
DRAPE EXTREMITY T 121X128X90 (DRAPE) ×2 IMPLANT
DRAPE U-SHAPE 47X51 STRL (DRAPES) ×2 IMPLANT
DRSG PAD ABDOMINAL 8X10 ST (GAUZE/BANDAGES/DRESSINGS) ×1 IMPLANT
DURAPREP 26ML APPLICATOR (WOUND CARE) ×2 IMPLANT
ELECT REM PT RETURN 9FT ADLT (ELECTROSURGICAL) ×2
ELECTRODE REM PT RTRN 9FT ADLT (ELECTROSURGICAL) ×1 IMPLANT
EVACUATOR 1/8 PVC DRAIN (DRAIN) ×2 IMPLANT
GAUZE XEROFORM 1X8 LF (GAUZE/BANDAGES/DRESSINGS) ×3 IMPLANT
GLOVE BIO SURGEON STRL SZ7 (GLOVE) ×2 IMPLANT
GLOVE BIO SURGEON STRL SZ7.5 (GLOVE) ×2 IMPLANT
GLOVE BIOGEL PI IND STRL 7.0 (GLOVE) ×1 IMPLANT
GLOVE BIOGEL PI IND STRL 8 (GLOVE) ×1 IMPLANT
GLOVE BIOGEL PI INDICATOR 7.0 (GLOVE) ×1
GLOVE BIOGEL PI INDICATOR 8 (GLOVE) ×1
GOWN PREVENTION PLUS XLARGE (GOWN DISPOSABLE) ×2 IMPLANT
GOWN STRL NON-REIN LRG LVL3 (GOWN DISPOSABLE) ×2 IMPLANT
HANDPIECE INTERPULSE COAX TIP (DISPOSABLE) ×2
HOOD PEEL AWAY FACE SHEILD DIS (HOOD) ×6 IMPLANT
INSERT SZ 5 22.5 (Knees) ×1 IMPLANT
KIT BASIN OR (CUSTOM PROCEDURE TRAY) ×2 IMPLANT
KIT ROOM TURNOVER OR (KITS) ×2 IMPLANT
MANIFOLD NEPTUNE II (INSTRUMENTS) ×2 IMPLANT
NS IRRIG 1000ML POUR BTL (IV SOLUTION) ×2 IMPLANT
PACK TOTAL JOINT (CUSTOM PROCEDURE TRAY) ×2 IMPLANT
PAD ARMBOARD 7.5X6 YLW CONV (MISCELLANEOUS) ×4 IMPLANT
PAD CAST 4YDX4 CTTN HI CHSV (CAST SUPPLIES) ×1 IMPLANT
PADDING CAST COTTON 4X4 STRL (CAST SUPPLIES) ×2
PADDING CAST COTTON 6X4 STRL (CAST SUPPLIES) ×2 IMPLANT
RASP HELIOCORDIAL MED (MISCELLANEOUS) IMPLANT
SET HNDPC FAN SPRY TIP SCT (DISPOSABLE) IMPLANT
SPONGE GAUZE 4X4 12PLY (GAUZE/BANDAGES/DRESSINGS) ×3 IMPLANT
STAPLER VISISTAT 35W (STAPLE) ×2 IMPLANT
SUCTION FRAZIER TIP 10 FR DISP (SUCTIONS) ×2 IMPLANT
SUT VIC AB 0 CT1 27 (SUTURE) ×2
SUT VIC AB 0 CT1 27XBRD ANBCTR (SUTURE) ×1 IMPLANT
SUT VIC AB 1 CTX 36 (SUTURE) ×2
SUT VIC AB 1 CTX36XBRD ANBCTR (SUTURE) ×1 IMPLANT
SUT VIC AB 2-0 CT1 27 (SUTURE) ×2
SUT VIC AB 2-0 CT1 TAPERPNT 27 (SUTURE) ×1 IMPLANT
TOWEL OR 17X24 6PK STRL BLUE (TOWEL DISPOSABLE) ×2 IMPLANT
TOWEL OR 17X26 10 PK STRL BLUE (TOWEL DISPOSABLE) ×2 IMPLANT
TRAY FOLEY CATH 14FR (SET/KITS/TRAYS/PACK) ×1 IMPLANT
TUBE ANAEROBIC SPECIMEN COL (MISCELLANEOUS) ×2 IMPLANT
WATER STERILE IRR 1000ML POUR (IV SOLUTION) ×6 IMPLANT

## 2011-12-23 NOTE — Anesthesia Postprocedure Evaluation (Signed)
Anesthesia Post Note  Patient: Eric Santos  Procedure(s) Performed: Procedure(s) (LRB): TOTAL KNEE REVISION (Right)  Anesthesia type: General  Patient location: PACU  Post pain: Pain level controlled and Adequate analgesia  Post assessment: Post-op Vital signs reviewed, Patient's Cardiovascular Status Stable, Respiratory Function Stable, Patent Airway and Pain level controlled  Last Vitals:  Filed Vitals:   12/23/11 0628  BP: 131/89  Pulse: 77  Temp: 36.7 C  Resp: 18    Post vital signs: Reviewed and stable  Level of consciousness: awake, alert  and oriented  Complications: No apparent anesthesia complications

## 2011-12-23 NOTE — Progress Notes (Signed)
UR COMPLETED  

## 2011-12-23 NOTE — Anesthesia Procedure Notes (Signed)
Anesthesia Regional Block:  Femoral nerve block  Pre-Anesthetic Checklist: ,, timeout performed, Correct Patient, Correct Site, Correct Laterality, Correct Procedure,, site marked, risks and benefits discussed, Surgical consent,  Pre-op evaluation,  At surgeon's request and post-op pain management  Laterality: Right  Prep: chloraprep       Needles:  Injection technique: Single-shot  Needle Type: Echogenic Stimulator Needle     Needle Length: 9cm  Needle Gauge: 21    Additional Needles:  Procedures: nerve stimulator Femoral nerve block  Nerve Stimulator or Paresthesia:  Response: Quadriceps muscle contraction, 0.45 mA,   Additional Responses:   Narrative:  Start time: 12/23/2011 7:01 AM End time: 12/23/2011 7:16 AM Injection made incrementally with aspirations every 5 mL.  Performed by: Personally  Anesthesiologist: Dr Chaney Malling  Additional Notes: Functioning IV was confirmed and monitors were applied.  A 90mm 21ga Arrow echogenic stimulator needle was used. Sterile prep and drape,hand hygiene and sterile gloves were used.  Negative aspiration and negative test dose prior to incremental administration of local anesthetic. The patient tolerated the procedure well.    Femoral nerve block

## 2011-12-23 NOTE — Op Note (Signed)
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  Post Op Dx: Stretched but competent lateral collateral ligament with the addition of broken post to TC 3 tibial bearing  Procedure: Revision right total knee arthroplasty with removal of fractured 10 mm tibial bearing and post, lengthening of the medial collateral ligament and reimplantation of a 22.5 mm TC 3 bearing and post.  Surgeon: Nestor Lewandowsky, MD  Assistant: Roney Mans PA-C  Anesthesia: General  EBL: Minimal  Fluids: 1500 cc  Tourniquet Time: One hour and 5 minutes  Indications: Patient had primary total knee arthroplasty on the right in 1993. Did well until he had an acute hematogenous infection in late 2008. Polyethylene bearing swab failed, he had placement of a PMMA spacer for a MRSA infection and was revised to a TC 3 femur with sleeve and stem and a DePuy MBT tibia with sleeve and stem 3 months later. Chart after the revision he started to develop a varus thrust with walking with lateral collateral ligament instability or stretching. He chose to live with this for approximately 3 years but would now like revision. Plan A is to remove the on sleeve portion of the femoral component and revise to a hinge, plan B. if it is infected is to remove all components. By x-ray there does not appear to be any loosening or wear of the components themselves. Risks and benefits of surgery were discussed with the patient.  Procedure: Patient was identified by arm band receive preoperative IV antibiotics and a right femoral nerve block in the holding area at [facility]. He was taken to the operating room where the appropriate anesthetic monitors were attached and general LMA anesthesia induced with the patient in the supine position. A Foley catheter was inserted. A tourniquet was applied high to the right thigh. Lateral post and foot positioner applied to the table. We  documented lateral collateral ligament instability in full extension with a varus thrust of about 30. The right lower Greer Pickerel was prepped and draped in the usual sterile fashion from the ankle to the midthigh. A timeout procedure was performed. The limb was wrapped with an Esmarch bandage, the tourniquet inflated to 350 mm of mercury with the knee bent to 90. We recreated the anterior midline incision and made a medial parapatellar arthrotomy using the Bovie for cauterization of small vessels. The synovium had a normal appearance the joint fluid had a normal appearance but was sent for Gram stain and culture. We then performed a fairly extensive anterior synovectomy and elevated the medial collateral ligament off of the proximal flare of the tibia going all the way around to the remember gnosis insertion posterior medially. The patella was then subluxed laterally exposing the tibial bearing and there was an obvious fracture in the middle of the high post of the TC 3 bearing. This provided a good reason for the ligamentous instability. We continued to perform synovectomy laterally and posteriorly and then removed the fractured bearing into pieces. The tibial and femoral components were tested and found to be stable. The lateral collateral ligament was asked to stable although it had elongated about a centimeter compared of the MCL. I continue to elevate the MCL distally down the tibia and posteriorly so that it had equal tension of the LCL. We then performed trial reductions with a 20 mm and a 22.5 mm TC 3 bearing and actually found that the knee had excellent stability to varus and valgus  testing and you could palpate the tension on the lateral collateral ligament with testing. Because the knee was now stable, and we had a good excuse for the instability with a fractured post, I elected to revise the tibial bearing only. The knee was thoroughly irrigated out normal saline solution and a new 22.5 mm TC 3 tibial  bearing was inserted. The knee had excellent stability to varus and valgus testing and extension, 30, and 45 of flexion,s well as flexion to 120. At this point we placed a medium Hemovac drains from an anterolateral approach. We once again irrigated with pulse lavage. The wound was closed in layers with running #1 Vicryl suture in the parapatellar arthrotomy, 2-0 Vicryl in the subcutaneous tissue, and skin staples. A dressing of Xerofoam 4 x 4 dressing sponges web roll and an Ace wrap was then applied. Followed by a knee immobilizer. Because this is a third revision total knee we will not be using CPM or any passive range of motion.

## 2011-12-23 NOTE — Anesthesia Preprocedure Evaluation (Signed)
Anesthesia Evaluation  Patient identified by MRN, date of birth, ID band Patient awake    Reviewed: Allergy & Precautions, H&P , NPO status , Patient's Chart, lab work & pertinent test results  Airway Mallampati: II  Neck ROM: full    Dental   Pulmonary sleep apnea ,          Cardiovascular     Neuro/Psych    GI/Hepatic GERD-  ,  Endo/Other  diabetes, Type obesity  Renal/GU      Musculoskeletal   Abdominal   Peds  Hematology   Anesthesia Other Findings   Reproductive/Obstetrics                           Anesthesia Physical Anesthesia Plan  ASA: III  Anesthesia Plan: General and Regional   Post-op Pain Management: MAC Combined w/ Regional for Post-op pain   Induction: Intravenous  Airway Management Planned: LMA  Additional Equipment:   Intra-op Plan:   Post-operative Plan:   Informed Consent: I have reviewed the patients History and Physical, chart, labs and discussed the procedure including the risks, benefits and alternatives for the proposed anesthesia with the patient or authorized representative who has indicated his/her understanding and acceptance.     Plan Discussed with: CRNA and Surgeon  Anesthesia Plan Comments:         Anesthesia Quick Evaluation

## 2011-12-23 NOTE — Transfer of Care (Signed)
Immediate Anesthesia Transfer of Care Note  Patient: Eric Santos  Procedure(s) Performed: Procedure(s) (LRB) with comments: TOTAL KNEE REVISION (Right)  Patient Location: PACU  Anesthesia Type: General  Level of Consciousness: awake, alert  and oriented  Airway & Oxygen Therapy: Patient Spontanous Breathing and Patient connected to nasal cannula oxygen  Post-op Assessment: Report given to PACU RN and Post -op Vital signs reviewed and stable  Post vital signs: Reviewed and stable  Complications: No apparent anesthesia complications

## 2011-12-23 NOTE — Interval H&P Note (Signed)
History and Physical Interval Note:  12/23/2011 7:18 AM  Eric Santos  has presented today for surgery, with the diagnosis of UNSTABLE LIGAMENTS RIGHT TOTAL KNEE  The various methods of treatment have been discussed with the patient and family. After consideration of risks, benefits and other options for treatment, the patient has consented to  Procedure(s) (LRB) with comments: TOTAL KNEE REVISION (Right) as a surgical intervention .  The patient's history has been reviewed, patient examined, no change in status, stable for surgery.  I have reviewed the patient's chart and labs.  Questions were answered to the patient's satisfaction.     Nestor Lewandowsky

## 2011-12-23 NOTE — OR Nursing (Signed)
Explant sent to [facility] for decontamination. To be returned to Dr. Turner Daniels.

## 2011-12-23 NOTE — Preoperative (Signed)
Beta Blockers   Reason not to administer Beta Blockers:Not Applicable 

## 2011-12-24 ENCOUNTER — Encounter (HOSPITAL_COMMUNITY): Payer: Self-pay | Admitting: Orthopedic Surgery

## 2011-12-24 LAB — GLUCOSE, CAPILLARY
Glucose-Capillary: 115 mg/dL — ABNORMAL HIGH (ref 70–99)
Glucose-Capillary: 94 mg/dL (ref 70–99)
Glucose-Capillary: 114 mg/dL — ABNORMAL HIGH (ref 70–99)

## 2011-12-24 LAB — BASIC METABOLIC PANEL
BUN: 9 mg/dL (ref 6–23)
Chloride: 103 mEq/L (ref 96–112)
GFR calc Af Amer: >90 mL/min (ref >90)
Glucose, Bld: 108 mg/dL — ABNORMAL HIGH (ref 70–99)
Potassium: 4.3 mEq/L (ref 3.5–5.1)
Sodium: 140 mEq/L (ref 135–145)

## 2011-12-24 LAB — CBC
HCT: 45.8 % (ref 39.0–52.0)
Hemoglobin: 15.3 g/dL (ref 13.0–17.0)
RDW: 13.4 % (ref 11.5–15.5)
WBC: 9.6 10*3/uL (ref 4.0–10.5)

## 2011-12-24 NOTE — Evaluation (Signed)
Physical Therapy Evaluation Patient Details Name: Eric Santos MRN: [id number] DOB: June 17, 1945 Today's Date: 12/24/2011 Time: 6962-9528 PT Time Calculation (min): 29 min  PT Assessment / Plan / Recommendation Clinical Impression  Pt is a 66 y/o male admitted s/p right TKA revision along with the below PT problem list. Pt would benefit from acute PT to maximize independence and facilitate d/c home with HHPT.    PT Assessment  Patient needs continued PT services    Follow Up Recommendations  Home health PT    Does the patient have the potential to tolerate intense rehabilitation      Barriers to Discharge None      Equipment Recommendations  None recommended by PT    Recommendations for Other Services     Frequency 7X/week    Precautions / Restrictions Precautions Precautions: Knee Precaution Booklet Issued: No Required Braces or Orthoses: Knee Immobilizer - Right Knee Immobilizer - Right: On at all times Restrictions Weight Bearing Restrictions: Yes RLE Weight Bearing: Weight bearing as tolerated   Pertinent Vitals/Pain 0/10 with evaluation.      Mobility  Bed Mobility Bed Mobility: Not assessed Transfers Transfers: Sit to Stand;Stand to Sit Sit to Stand: 4: Min guard;With upper extremity assist;From chair/3-in-1 Stand to Sit: 4: Min guard;With upper extremity assist;To toilet Details for Transfer Assistance: Guarding for balance with cues for safest hand/right LE placement. Ambulation/Gait Ambulation/Gait Assistance: 4: Min guard Ambulation Distance (Feet): 220 Feet Assistive device: Rolling walker Ambulation/Gait Assistance Details: Guarding for balance with cues for safe sequence with RW and attempted step-through sequence. Gait Pattern: Step-to pattern;Decreased step length - right;Decreased stance time - left Stairs: Yes Stairs Assistance: 4: Min guard Stairs Assistance Details (indicate cue type and reason): Guarding for balance with cues for "up with  good, down with bad." Stair Management Technique: Step to pattern;Backwards;With walker Number of Stairs: 1  Wheelchair Mobility Wheelchair Mobility: No    Shoulder Instructions     Exercises Total Joint Exercises Ankle Circles/Pumps: AROM;Right;10 reps;Supine Quad Sets: AROM;Right;10 reps;Supine Heel Slides: AAROM;Right;10 reps;Supine   PT Diagnosis: Difficulty walking  PT Problem List: Decreased strength;Decreased range of motion;Decreased activity tolerance;Decreased mobility;Decreased knowledge of use of DME;Decreased knowledge of precautions PT Treatment Interventions: DME instruction;Gait training;Stair training;Functional mobility training;Therapeutic activities;Therapeutic exercise;Patient/family education   PT Goals Acute Rehab PT Goals PT Goal Formulation: With patient/family Time For Goal Achievement: 12/31/11 Potential to Achieve Goals: Good Pt will go Supine/Side to Sit: with modified independence PT Goal: Supine/Side to Sit - Progress: Goal set today Pt will go Sit to Supine/Side: with modified independence PT Goal: Sit to Supine/Side - Progress: Goal set today Pt will go Sit to Stand: with modified independence PT Goal: Sit to Stand - Progress: Goal set today Pt will go Stand to Sit: with modified independence PT Goal: Stand to Sit - Progress: Goal set today Pt will Ambulate: >150 feet;with modified independence;with least restrictive assistive device PT Goal: Ambulate - Progress: Goal set today Pt will Go Up / Down Stairs: 1-2 stairs;with modified independence;with least restrictive assistive device PT Goal: Up/Down Stairs - Progress: Goal set today Pt will Perform Home Exercise Program: Independently PT Goal: Perform Home Exercise Program - Progress: Goal set today  Visit Information  Last PT Received On: 12/24/11 Assistance Needed: +1    Subjective Data  Subjective: "No pain when I am sitting still." Patient Stated Goal: Go home.   Prior Functioning   Home Living Lives With: Spouse Available Help at Discharge: Family;Available 24 hours/day Type of  Home: House Home Access: Stairs to enter [employer] of Steps: 1 Entrance Stairs-Rails: None Home Layout: One level Bathroom Shower/Tub: Walk-in Contractor: Handicapped height Home Adaptive Equipment: Walker - rolling;Wheelchair - powered;Built-in shower seat Prior Function Level of Independence: Independent Able to Take Stairs?: Yes Driving: Yes Vocation: Retired Musician: No difficulties Dominant Hand: Right    Cognition  Overall Cognitive Status: Appears within functional limits for tasks assessed/performed Arousal/Alertness: Awake/alert Orientation Level: Appears intact for tasks assessed Behavior During Session: [facility] for tasks performed    Extremity/Trunk Assessment Right Upper Extremity Assessment RUE ROM/Strength/Tone: Within functional levels RUE Sensation: WFL - Light Touch RUE Coordination: WFL - gross/fine motor Left Upper Extremity Assessment LUE ROM/Strength/Tone: Within functional levels LUE Sensation: WFL - Light Touch LUE Coordination: WFL - gross/fine motor Right Lower Extremity Assessment RLE ROM/Strength/Tone: Deficits;Due to pain RLE ROM/Strength/Tone Deficits: 3/5 throughout. 0-60 degrees AA/ROM. RLE Sensation: WFL - Light Touch RLE Coordination: WFL - gross motor Left Lower Extremity Assessment LLE ROM/Strength/Tone: Within functional levels LLE Sensation: WFL - Light Touch LLE Coordination: WFL - gross motor Trunk Assessment Trunk Assessment: Normal   Balance Balance Balance Assessed: No  End of Session PT - End of Session Equipment Utilized During Treatment: Gait belt;Right knee immobilizer Activity Tolerance: Patient tolerated treatment well Patient left: with call bell/phone within reach;with family/visitor present;Other (comment) (With OT on toilet.) Nurse Communication: Mobility status  GP       Cephus Shelling 12/24/2011, 8:59 AM  12/24/2011 Cephus Shelling, PT, DPT [phone]

## 2011-12-24 NOTE — Progress Notes (Signed)
Occupational Therapy Discharge Patient Details Name: Eric Santos MRN: [id number] DOB: 05-27-1945 Today's Date: 12/24/2011 Time: 4098-1191 OT Time Calculation (min): 15 min  Patient discharged from OT services secondary to Pt. and wife knowledgable about precautions due to previous surgery. Pt. demonstrates min guard to supervision level to complete most ADL's. Wife available to (A) with needs. No further acute OT needs at this time.   Please see latest therapy progress note for current level of functioning and progress toward goals.    Progress and discharge plan discussed with patient and/or caregiver: Patient/Caregiver agrees with plan  GO     [name] 12/24/2011, 9:39 AM

## 2011-12-24 NOTE — Progress Notes (Signed)
Patient ID: AJ CRUNKLETON, male   DOB: 1946/02/08, 66 y.o.   MRN: [id number] PATIENT ID: MATHIEU SCHLOEMER  MRN: [id number]  DOB/AGE:  05/14/1945 / 66 y.o.  1 Day Post-Op Procedure(s) (LRB): TOTAL KNEE REVISION (Right)    PROGRESS NOTE Subjective: Patient is alert, oriented, no Nausea, no Vomiting, yes passing gas, no Bowel Movement. Taking PO well. Denies SOB, Chest or Calf Pain. Using Incentive Spirometer, PAS in place. Ambulate WBAT, CPM no CPM, use knee immob, AROM only Patient reports pain as 4 on 0-10 scale  .    Objective: Vital signs in last 24 hours: Filed Vitals:   12/23/11 1045 12/23/11 1100 12/23/11 1400 12/23/11 2000  BP:  150/91 121/85 125/88  Pulse: 76 80 74 88  Temp:  97.4 F (36.3 C) 97.7 F (36.5 C) 97.8 F (36.6 C)  TempSrc:      Resp: 14 12 18 18   Height: 5\' 6"  (1.676 m)     Weight: 119.5 kg (263 lb 7.2 oz)     SpO2: 96% 96% 97% 99%      Intake/Output from previous day: I/O last 3 completed shifts: In: 1400 [I.V.:1300; IV Piggyback:100] Out: 725 [Urine:600; Drains:75; Blood:50]   Intake/Output this shift: Total I/O In: -  Out: 400 [Urine:250; Drains:150]   LABORATORY DATA: No results found for this basename: WBC:2,HGB:2,HCT:2,PLT:2,NA:2,K:2,CL:2,CO2:2,BUN:2,CREATININE:2,GLUCOSE:2,GLUCAP:3,PT:2,INR:2,CALCIUM,2 in the last 72 hours  Examination: Neurologically intact ABD soft Neurovascular intact Sensation intact distally Intact pulses distally Dorsiflexion/Plantar flexion intact Incision: scant drainage No cellulitis present Compartment soft} Blood and plasma separated in drain indicating minimal recent drainage, drain pulled without difficulty.  Assessment:   1 Day Post-Op Procedure(s) (LRB): TOTAL KNEE REVISION (Right) ADDITIONAL DIAGNOSIS:  Hx of infected TKA 2009  Plan: PT/OT WBAT, CPM 5/hrs day until ROM 0-90 degrees, then D/C CPM DVT Prophylaxis:  SCDx72hrs, ASA 325 mg BID x 2 weeks DISCHARGE PLAN: Home prob tomorrow DISCHARGE NEEDS:  HHPT, HHRN, CPM, Walker and 3-in-1 comode seat     Georgana Romain J 12/24/2011, 6:33 AM

## 2011-12-24 NOTE — Progress Notes (Signed)
I agree with the following treatment note after reviewing documentation.   Johnston, Casy Brunetto Brynn   OTR/L Pager: [phone] Office: [phone] .   

## 2011-12-24 NOTE — Evaluation (Signed)
I agree with the following treatment note after reviewing documentation.   Johnston, Llewelyn Sheaffer Brynn   OTR/L Pager: [phone] Office: [phone] .   

## 2011-12-24 NOTE — Evaluation (Signed)
Occupational Therapy Evaluation Patient Details Name: Eric Santos MRN: [id number] DOB: Oct 15, 1945 Today's Date: 12/24/2011 Time: 4098-1191 OT Time Calculation (min): 15 min  OT Assessment / Plan / Recommendation Clinical Impression  Pt. is 66 yo male s/p right TKA. Pt. is doing very well. Has been through surgery before and is knowledgable about precautions. Wife is at home to (A) with all needs. Demonstrates min guard to supervision for most ADL's requiring only min (A) to get RLE into pant legs for LB dressing. Educated on AE for LB dressing and bathing if wife were unable to (A). Pt states he already has purchased AE from previous surgery. No further acute OT needs at this time. OT to sign off    OT Assessment  Patient does not need any further OT services    Follow Up Recommendations  Supervision - Intermittent    Barriers to Discharge      Equipment Recommendations  None recommended by OT    Recommendations for Other Services    Frequency       Precautions / Restrictions Precautions Precautions: Knee Precaution Booklet Issued: No Required Braces or Orthoses: Knee Immobilizer - Right Knee Immobilizer - Right: On at all times Restrictions Weight Bearing Restrictions: Yes RLE Weight Bearing: Weight bearing as tolerated   Pertinent Vitals/Pain 2/10 resting 5/10 during activity    ADL  Grooming: Performed;Wash/dry hands;Wash/dry face;Teeth care;Supervision/safety Where Assessed - Grooming: Unsupported standing Lower Body Bathing: Simulated;Minimal assistance ((A) from wife with RLE) Where Assessed - Lower Body Bathing: Supported sit to stand Lower Body Dressing: Performed;Minimal assistance (Wife (A) with RLE ) Where Assessed - Lower Body Dressing: Supported sit to Pharmacist, hospital: Performed;Min Pension scheme manager Method: Sit to Barista: Regular height toilet;Grab bars Toileting - Architect and Hygiene:  Performed;Independent Where Assessed - Toileting Clothing Manipulation and Hygiene: Sit on 3-in-1 or toilet Tub/Shower Transfer: Simulated;Supervision/safety Tub/Shower Transfer Method: Science writer: Walk in shower Equipment Used: Gait belt;Knee Immobilizer;Rolling walker Transfers/Ambulation Related to ADLs: min guard for safety  ADL Comments: Pt. completed grooming tasks at sink level with supervision. Simulated walk in shower transfer at supervision level, gave pt and wife handout on shower transfers to refer to once home. Pt. completed LB dressing with min (A) from wife to get RLE foot into pant leg. Educated on use of reacher to (A) if wife were unavailable to (A)      OT Diagnosis:    OT Problem List:   OT Treatment Interventions:     OT Goals    Visit Information  Last OT Received On: 12/24/11 Assistance Needed: +1    Subjective Data  Subjective: I have been through this before, so I kind of know what to expect.  Patient Stated Goal: To go home tomorrow    Prior Functioning     Home Living Lives With: Spouse Available Help at Discharge: Family;Available 24 hours/day Type of Home: House Home Access: Stairs to enter [employer] of Steps: 1 Entrance Stairs-Rails: None Home Layout: One level Bathroom Shower/Tub: Walk-in shower;Curtain Bathroom Toilet: Handicapped height Home Adaptive Equipment: Grab bars around toilet;Hand-held shower hose;Built-in shower seat;Walker - rolling;Wheelchair - powered Prior Function Level of Independence: Independent Able to Take Stairs?: Yes Driving: Yes Vocation: Retired Musician: No difficulties Dominant Hand: Right         Vision/Perception     Cognition  Overall Cognitive Status: Appears within functional limits for tasks assessed/performed Arousal/Alertness: Awake/alert Orientation Level: Appears intact for tasks assessed  Behavior During Session: [facility] for tasks  performed    Extremity/Trunk Assessment Right Upper Extremity Assessment RUE ROM/Strength/Tone: Within functional levels RUE Sensation: WFL - Light Touch RUE Coordination: WFL - gross/fine motor Left Upper Extremity Assessment LUE ROM/Strength/Tone: Within functional levels LUE Sensation: WFL - Light Touch LUE Coordination: WFL - gross/fine motor Right Lower Extremity Assessment RLE ROM/Strength/Tone: Deficits;Due to pain RLE ROM/Strength/Tone Deficits: 3/5 throughout. 0-60 degrees AA/ROM. RLE Sensation: WFL - Light Touch RLE Coordination: WFL - gross motor Left Lower Extremity Assessment LLE ROM/Strength/Tone: Within functional levels LLE Sensation: WFL - Light Touch LLE Coordination: WFL - gross motor Trunk Assessment Trunk Assessment: Normal     Mobility Bed Mobility Bed Mobility: Not assessed Transfers Transfers: Sit to Stand;Stand to Sit Sit to Stand: 4: Min guard;With upper extremity assist;From bed;From toilet Stand to Sit: 4: Min guard;To bed;To chair/3-in-1;To toilet;With upper extremity assist Details for Transfer Assistance: guarding for saftey           Exercise Total Joint Exercises Ankle Circles/Pumps: AROM;Right;10 reps;Supine Quad Sets: AROM;Right;10 reps;Supine Heel Slides: AAROM;Right;10 reps;Supine   Balance Balance Balance Assessed: No   End of Session OT - End of Session Equipment Utilized During Treatment: Gait belt;Right knee immobilizer Activity Tolerance: Patient tolerated treatment well Patient left: in chair;with call bell/phone within reach;with family/visitor present Nurse Communication: Mobility status;Other (comment) (urine output)  GO     Eric Santos 12/24/2011, 9:38 AM

## 2011-12-24 NOTE — Care Management Note (Signed)
    Page 1 of 1   12/24/2011     11:22:03 AM   CARE MANAGEMENT NOTE 12/24/2011  Patient:  KARMINE, KAUER   Account Number:  [id number]  Date Initiated:  12/23/2011  Documentation initiated by:  Anette Guarneri  Subjective/Objective Assessment:   TKA  HH serivces pre-arranged by MD office  DME w/T&T     Action/Plan:   Home with HH serives w/[facility]  DME w/T&T   Anticipated DC Date:  12/25/2011   Anticipated DC Plan:  HOME W HOME HEALTH SERVICES      DC Planning Services  CM consult      Choice offered to / List presented to:             Status of service:  Completed, signed off Medicare Important Message given?   (If response is "NO", the following Medicare IM given date fields will be blank) Date Medicare IM given:   Date Additional Medicare IM given:    Discharge Disposition:  HOME W HOME HEALTH SERVICES  Per UR Regulation:  Reviewed for med. necessity/level of care/duration of stay  If discussed at Long Length of Stay Meetings, dates discussed:    Comments:  12/24/11  11:20  Anette Guarneri RN/CM [facility] services prearranged by MD office with Genevieve Norlander Patient has all DME

## 2011-12-24 NOTE — Progress Notes (Signed)
PT Treatment Note:   12/24/11 1151  PT Visit Information  Last PT Received On 12/24/11  Assistance Needed +1  PT Time Calculation  PT Start Time 1053  PT Stop Time 1116  PT Time Calculation (min) 23 min  Subjective Data  Subjective "I know the faster I do this the faster I go home."  Patient Stated Goal Go home.  Precautions  Precautions Knee  Precaution Booklet Issued No  Required Braces or Orthoses Knee Immobilizer - Right  Knee Immobilizer - Right On at all times  Restrictions  Weight Bearing Restrictions Yes  RLE Weight Bearing WBAT  Cognition  Overall Cognitive Status Appears within functional limits for tasks assessed/performed  Arousal/Alertness Awake/alert  Orientation Level Appears intact for tasks assessed  Behavior During Session [facility] for tasks performed  Bed Mobility  Bed Mobility Not assessed  Transfers  Transfers Sit to Stand;Stand to Sit  Sit to Stand 5: Supervision;With upper extremity assist;From chair/3-in-1  Stand to Sit 5: Supervision;With upper extremity assist;To chair/3-in-1  Details for Transfer Assistance Verbal cues for safest hand/right LE placement.  Ambulation/Gait  Ambulation/Gait Assistance 5: Supervision  Ambulation Distance (Feet) 250 Feet  Assistive device Rolling walker  Ambulation/Gait Assistance Details Verbal cues for progression to step-through sequence with initial contact on right heel.  Gait Pattern Step-to pattern;Step-through pattern;Decreased step length - right;Decreased stance time - right  Stairs No  Engineering geologist No  Balance  Balance Assessed No  Exercises  Exercises Total Joint  Total Joint Exercises  Ankle Circles/Pumps AROM;Right;10 reps;Supine  Quad Sets AROM;Right;10 reps;Supine  Hip ABduction/ADduction AROM;Right;10 reps;Supine  Straight Leg Raises AROM;Right;10 reps;Supine  PT - End of Session  Equipment Utilized During Treatment Gait belt;Right knee immobilizer  Activity Tolerance  Patient tolerated treatment well  Patient left in chair;with call bell/phone within reach;with family/visitor present  Nurse Communication Mobility status  PT - Assessment/Plan  Comments on Treatment Session Pt admitted s/p right TKA revision and continues to progress with therapy. Pt motivated and able to ambulate with increased independence/distance this session.  PT Plan Discharge plan remains appropriate;Frequency remains appropriate  PT Frequency 7X/week  Follow Up Recommendations Home health PT  Equipment Recommended None recommended by PT  Acute Rehab PT Goals  PT Goal Formulation With patient/family  Time For Goal Achievement 12/31/11  Potential to Achieve Goals Good  PT Goal: Sit to Stand - Progress Progressing toward goal  PT Goal: Stand to Sit - Progress Progressing toward goal  PT Goal: Ambulate - Progress Progressing toward goal  PT Goal: Perform Home Exercise Program - Progress Progressing toward goal  PT General Charges  $$ ACUTE PT VISIT 1 Procedure  PT Treatments  $Gait Training 8-22 mins  $Therapeutic Exercise 8-22 mins    Pain:  No c/o with treatment.  12/24/2011 Cephus Shelling, PT, DPT [phone]

## 2011-12-25 DIAGNOSIS — T84012A Broken internal right knee prosthesis, initial encounter: Secondary | ICD-10-CM

## 2011-12-25 LAB — CBC
MCH: 30.8 pg (ref 26.0–34.0)
MCV: 90.4 fL (ref 78.0–100.0)
Platelets: 182 10*3/uL (ref 150–400)
RBC: 5 MIL/uL (ref 4.22–5.81)
RDW: 13.6 % (ref 11.5–15.5)

## 2011-12-25 LAB — GLUCOSE, CAPILLARY

## 2011-12-25 MED ORDER — ASPIRIN 325 MG PO TBEC: 325.0000 mg | DELAYED_RELEASE_TABLET | Freq: Two times a day (BID) | ORAL | Status: DC

## 2011-12-25 MED ORDER — OXYCODONE-ACETAMINOPHEN 5-325 MG PO TABS: 1.0000 | ORAL_TABLET | ORAL | Status: DC | PRN

## 2011-12-25 NOTE — Discharge Summary (Signed)
Patient ID: Eric Santos MRN: [id number] DOB/AGE: 65-Feb-1947 65 y.o.  Admit date: 12/23/2011 Discharge date: 12/25/2011  Admission Diagnoses:  Principal Problem:  *Failed total knee, right   Discharge Diagnoses:  Same  Past Medical History  Diagnosis Date  . ALLERGIC RHINITIS 01/18/2008  . BACK PAIN 03/03/2008  . DIVERTICULOSIS, COLON 01/18/2008  . 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 FATIGUE 10/03/2009  . GERD 01/18/2008  . HYPERLIPIDEMIA 01/18/2008  . NEPHROLITHIASIS, HX OF 01/18/2008  . Other specified forms of hearing loss 10/03/2009  . SKIN LESION 03/03/2008  . Hyperlipidemia 10/15/2011  . Basal cell carcinoma of brow 10/15/2011  . Sleep apnea 2009    unable to tolerate CPAP  . History of blood transfusion 1967    after being ran over by tractor  . Shortness of breath   . DIABETES MELLITUS, TYPE II 01/18/2008  . H/O hiatal hernia   . Arthritis     Surgeries: Procedure(s): TOTAL KNEE REVISION on 12/23/2011   Consultants:    Discharged Condition: Improved  Hospital Course: Eric Santos is an 66 y.o. male who was admitted 12/23/2011 for operative treatment ofFailed total knee, right. Patient has severe unremitting pain that affects sleep, daily activities, and work/hobbies. After pre-op clearance the patient was taken to the operating room on 12/23/2011 and underwent  Procedure(s): TOTAL KNEE REVISION.    Patient was given perioperative antibiotics: Anti-infectives     Start     Dose/Rate Route Frequency Ordered Stop   12/22/11 1353   ceFAZolin (ANCEF) IVPB 2 g/50 mL premix        2 g 100 mL/hr over 30 Minutes Intravenous 60 min pre-op 12/22/11 1353 12/23/11 0745           Patient was given sequential compression devices, early ambulation, and chemoprophylaxis to prevent DVT.  Patient benefited maximally from hospital stay and there were no complications.    Recent vital signs: Patient Vitals for the past 24 hrs:  BP Temp Pulse Resp SpO2    12/25/11 0621 125/84 mmHg 97.8 F (36.6 C) 99  18  96 %  12/25/11 0310 - - - 18  -  12/25/11 0000 - - - 18  -  12-28-11 2240 119/86 mmHg 98.8 F (37.1 C) 100  18  94 %  2011-12-28 2000 - - - 18  -  Dec 28, 2011 1408 112/75 mmHg 98.1 F (36.7 C) 83  18  96 %     Recent laboratory studies:  Basename 12/25/11 0645 12-28-11 0650  WBC 9.7 9.6  HGB 15.4 15.3  HCT 45.2 45.8  PLT 182 186  NA -- 140  K -- 4.3  CL -- 103  CO2 -- 27  BUN -- 9  CREATININE -- 0.73  GLUCOSE -- 108*  INR -- --  CALCIUM -- 8.7     Discharge Medications:     Medication List     As of 12/25/2011  8:17 AM    STOP taking these medications         ALEVE 220 MG Caps   Generic drug: Naproxen Sodium      aspirin 81 MG tablet      TAKE these medications         aspirin 325 MG EC tablet   Take 1 tablet (325 mg total) by mouth 2 (two) times daily.      cetirizine 10 MG tablet   Commonly known as: ZYRTEC   Take 10 mg by mouth daily as needed. For allergies  cyclobenzaprine 5 MG tablet   Commonly known as: FLEXERIL   Take 5 mg by mouth 3 (three) times daily as needed. For muscle spasms      fluticasone 50 MCG/ACT nasal spray   Commonly known as: FLONASE   Place 1 spray into the nose 2 (two) times daily.      lovastatin 20 MG tablet   Commonly known as: MEVACOR   Take 10 mg by mouth daily.      multivitamins ther. w/minerals Tabs   Take 1 tablet by mouth daily.      oxyCODONE-acetaminophen 5-325 MG per tablet   Commonly known as: PERCOCET/ROXICET   Take 1-2 tablets by mouth every 4 (four) hours as needed for pain.      VICKS NYQUIL COUGH PO   Take 2 capsules by mouth at bedtime as needed. For sleep and cough        Diagnostic Studies: Dg Chest 2 View  12/17/2011  *RADIOLOGY REPORT*  Clinical Data: Preoperative evaluation for right knee surgery.  CHEST - 2 VIEW  Comparison: Chest x-ray of 02/20/2007.  Findings: Lung volumes are low.  No consolidative airspace disease. No pleural  effusions.  Pulmonary vasculature is normal.  Heart size is normal.  Retrocardiac lucency with air fluid level compatible with a moderate sized hiatal hernia.  Mediastinal contours are otherwise unremarkable.  Surgical clips near the gastroesophageal junction.  IMPRESSION: 1.  No radiographic evidence of acute cardiopulmonary disease. 2.  Moderate sized hiatal hernia.   Original Report Authenticated By: Florencia Reasons, M.D.    Dg Knee Right Port  12/23/2011  *RADIOLOGY REPORT*  Clinical Data: Postop knee  PORTABLE RIGHT KNEE - 1-2 VIEW  Comparison: 07/06/2007  Findings: Revision of the right total knee arthroplasty.  Associated surgical drain, subcutaneous gas, and overlying skin staples.  IMPRESSION: Status post revision of right total knee arthroplasty.   Original Report Authenticated By: Charline Bills, M.D.     Disposition:       Discharge Orders    Future Orders Please Complete By Expires   Increase activity slowly      Walker       May shower / Bathe      Change dressing (specify)      Comments:   Dressing change as needed.   Call MD for:  temperature >100.4      Call MD for:  severe uncontrolled pain      Call MD for:  redness, tenderness, or signs of infection (pain, swelling, redness, odor or green/yellow discharge around incision site)      Discharge instructions      Comments:   F/U with Dr. Turner Daniels as scheduled (POD #14).  No CPM.  No passive ROM with PT.  Active ROM is ok.   Driving Restrictions      Comments:   No driving for 2 weeks.         SignedHazle Nordmann. 12/25/2011, 8:17 AM

## 2011-12-25 NOTE — Progress Notes (Signed)
PATIENT ID: Eric Santos  MRN: [id number]  DOB/AGE:  Mar 11, 1945 / 66 y.o.  2 Days Post-Op Procedure(s) (LRB): TOTAL KNEE REVISION (Right)    PROGRESS NOTE Subjective: Patient is alert, oriented, no Nausea, no Vomiting, yes passing gas, no Bowel Movement. Taking PO well. Denies SOB, Chest or Calf Pain. Using Incentive Spirometer, PAS in place. Ambulating well with PT.  No CPM Patient reports pain as moderate  .    Objective: Vital signs in last 24 hours: Filed Vitals:   12/24/11 2240 12/25/11 0000 12/25/11 0310 12/25/11 0621  BP: 119/86   125/84  Pulse: 100   99  Temp: 98.8 F (37.1 C)   97.8 F (36.6 C)  TempSrc:      Resp: 18 18 18 18   Height:      Weight:      SpO2: 94%   96%      Intake/Output from previous day: I/O last 3 completed shifts: In: 2851.3 [P.O.:960; I.V.:1791.3; IV Piggyback:100] Out: 2625 [Urine:2400; Drains:225]   Intake/Output this shift:     LABORATORY DATA:  Basename 12/25/11 0656 12/25/11 0645 12/24/11 2144 12/24/11 1553 12/24/11 0650  WBC -- 9.7 -- -- 9.6  HGB -- 15.4 -- -- 15.3  HCT -- 45.2 -- -- 45.8  PLT -- 182 -- -- 186  NA -- -- -- -- 140  K -- -- -- -- 4.3  CL -- -- -- -- 103  CO2 -- -- -- -- 27  BUN -- -- -- -- 9  CREATININE -- -- -- -- 0.73  GLUCOSE -- -- -- -- 108*  GLUCAP 91 -- 114* 114* --  INR -- -- -- -- --  CALCIUM -- -- -- -- 8.7    Examination: Neurologically intact ABD soft Neurovascular intact Sensation intact distally Intact pulses distally Dorsiflexion/Plantar flexion intact Incision: scant drainage}  Assessment:   2 Days Post-Op Procedure(s) (LRB): TOTAL KNEE REVISION (Right) ADDITIONAL DIAGNOSIS:  none  Plan: PT/OT WBAT, No CPM, no passive ROM.  Active ROM is OK. DVT Prophylaxis:  SCDx72hrs, ASA 325 mg BID x 2 weeks DISCHARGE PLAN: Home today DISCHARGE NEEDS: HHPT, HHRN, Walker and 3-in-1 comode seat     Gwynn Crossley M. 12/25/2011, 8:14 AM

## 2011-12-25 NOTE — Progress Notes (Signed)
Physical Therapy Treatment Patient Details Name: Eric Santos MRN: [id number] DOB: 06/07/1945 Today's Date: 12/25/2011 Time: 4098-1191 PT Time Calculation (min): 33 min  PT Assessment / Plan / Recommendation Comments on Treatment Session  Pt able to increase gait quality and states having no concerns about home management.  Pt is ready for d/c home when deemed medically safe.    Follow Up Recommendations  Home health PT     Does the patient have the potential to tolerate intense rehabilitation     Barriers to Discharge        Equipment Recommendations  None recommended by PT    Recommendations for Other Services    Frequency 7X/week   Plan Discharge plan remains appropriate;Frequency remains appropriate    Precautions / Restrictions Precautions Precautions: Knee Precaution Booklet Issued: No Required Braces or Orthoses: Knee Immobilizer - Right Knee Immobilizer - Right: On at all times Restrictions Weight Bearing Restrictions: Yes RLE Weight Bearing: Weight bearing as tolerated   Pertinent Vitals/Pain Pain in R LE during ambulation did not rate.    Mobility  Bed Mobility Bed Mobility: Not assessed Transfers Transfers: Sit to Stand;Stand to Sit Sit to Stand: 6: Modified independent (Device/Increase time);With upper extremity assist;From chair/3-in-1;From toilet Stand to Sit: 6: Modified independent (Device/Increase time);With upper extremity assist;To chair/3-in-1;To toilet Details for Transfer Assistance: Modified Independent due to increased time Ambulation/Gait Ambulation/Gait Assistance: 5: Supervision Ambulation Distance (Feet): 210 Feet Assistive device: Rolling walker Ambulation/Gait Assistance Details: Cues for safety and proper step sequence. Gait Pattern: Step-to pattern;Step-through pattern;Decreased step length - right;Decreased stance time - right Stairs: No Wheelchair Mobility Wheelchair Mobility: No    Exercises Total Joint Exercises Quad Sets:  AROM;Right;5 reps Heel Slides: AAROM;Right;5 reps   PT Diagnosis:    PT Problem List:   PT Treatment Interventions:     PT Goals Acute Rehab PT Goals PT Goal Formulation: With patient/family Time For Goal Achievement: 12/31/11 Potential to Achieve Goals: Good Pt will go Sit to Stand: with modified independence PT Goal: Sit to Stand - Progress: Met Pt will go Stand to Sit: with modified independence PT Goal: Stand to Sit - Progress: Met Pt will Ambulate: >150 feet;with modified independence;with least restrictive assistive device PT Goal: Ambulate - Progress: Met Pt will Perform Home Exercise Program: Independently PT Goal: Perform Home Exercise Program - Progress: Progressing toward goal  Visit Information  Last PT Received On: 12/25/11 Assistance Needed: +1    Subjective Data  Subjective: I really need to use the restroom before we get started. Patient Stated Goal: Go home.   Cognition  Overall Cognitive Status: Appears within functional limits for tasks assessed/performed Arousal/Alertness: Awake/alert Orientation Level: Appears intact for tasks assessed Behavior During Session: [facility] for tasks performed    Balance  Balance Balance Assessed: No  End of Session PT - End of Session Equipment Utilized During Treatment: Right knee immobilizer Activity Tolerance: Patient tolerated treatment well Patient left: in chair;with call bell/phone within reach;with family/visitor present Nurse Communication: Mobility status   GP     Eric Santos 12/25/2011, 8:51 AM

## 2011-12-25 NOTE — Progress Notes (Signed)
Agree with PT treatment note.  Nakeia Calvi, PT DPT [phone]  

## 2011-12-26 LAB — BODY FLUID CULTURE

## 2011-12-26 LAB — TYPE AND SCREEN
ABO/RH(D): A POS
Donor AG Type: NEGATIVE
Unit division: 0
Unit division: 0

## 2011-12-28 LAB — ANAEROBIC CULTURE

## 2012-03-11 ENCOUNTER — Other Ambulatory Visit: Payer: Self-pay

## 2012-03-11 MED ORDER — FLUTICASONE PROPIONATE 50 MCG/ACT NA SUSP: 1.0000 | Freq: Two times a day (BID) | NASAL | Status: DC

## 2012-10-26 ENCOUNTER — Ambulatory Visit (INDEPENDENT_AMBULATORY_CARE_PROVIDER_SITE_OTHER): Payer: Medicare PPO | Admitting: Internal Medicine

## 2012-10-26 ENCOUNTER — Encounter: Payer: Self-pay | Admitting: Internal Medicine

## 2012-10-26 ENCOUNTER — Other Ambulatory Visit (INDEPENDENT_AMBULATORY_CARE_PROVIDER_SITE_OTHER): Payer: Medicare PPO

## 2012-10-26 VITALS — BP 120/88 | HR 76 | Temp 98.1°F | Ht 66.0 in | Wt 259.0 lb

## 2012-10-26 DIAGNOSIS — Z0000 Encounter for general adult medical examination without abnormal findings: Secondary | ICD-10-CM

## 2012-10-26 DIAGNOSIS — C44320 Squamous cell carcinoma of skin of unspecified parts of face: Secondary | ICD-10-CM | POA: Insufficient documentation

## 2012-10-26 DIAGNOSIS — G5601 Carpal tunnel syndrome, right upper limb: Secondary | ICD-10-CM | POA: Insufficient documentation

## 2012-10-26 DIAGNOSIS — G5600 Carpal tunnel syndrome, unspecified upper limb: Secondary | ICD-10-CM

## 2012-10-26 DIAGNOSIS — E119 Type 2 diabetes mellitus without complications: Secondary | ICD-10-CM

## 2012-10-26 DIAGNOSIS — N320 Bladder-neck obstruction: Secondary | ICD-10-CM

## 2012-10-26 DIAGNOSIS — R002 Palpitations: Secondary | ICD-10-CM

## 2012-10-26 DIAGNOSIS — Z23 Encounter for immunization: Secondary | ICD-10-CM

## 2012-10-26 LAB — BASIC METABOLIC PANEL
Calcium: 9.3 mg/dL (ref 8.4–10.5)
GFR: 109.12 mL/min (ref >60.00)
Glucose, Bld: 98 mg/dL (ref 70–99)
Sodium: 138 mEq/L (ref 135–145)

## 2012-10-26 LAB — CBC WITH DIFFERENTIAL/PLATELET
Basophils Absolute: 0 10*3/uL (ref 0.0–0.1)
Eosinophils Relative: 2.7 % (ref 0.0–5.0)
HCT: 49.9 % (ref 39.0–52.0)
Hemoglobin: 17 g/dL (ref 13.0–17.0)
Lymphs Abs: 2.2 10*3/uL (ref 0.7–4.0)
MCV: 89 fl (ref 78.0–100.0)
Monocytes Relative: 10.9 % (ref 3.0–12.0)
Neutro Abs: 3.8 10*3/uL (ref 1.4–7.7)
RDW: 13.2 % (ref 11.5–14.6)

## 2012-10-26 LAB — LIPID PANEL
HDL: 36.9 mg/dL — ABNORMAL LOW (ref >39.00)
LDL Cholesterol: 94 mg/dL (ref 0–99)
Total CHOL/HDL Ratio: 4
VLDL: 25.4 mg/dL (ref 0.0–40.0)

## 2012-10-26 LAB — URINALYSIS, ROUTINE W REFLEX MICROSCOPIC
Hgb urine dipstick: NEGATIVE
Ketones, ur: NEGATIVE
RBC / HPF: NONE SEEN (ref 0–?)
Specific Gravity, Urine: >=1.03 (ref 1.000–1.030)
Total Protein, Urine: NEGATIVE
Urine Glucose: NEGATIVE
Urobilinogen, UA: 0.2 (ref 0.0–1.0)

## 2012-10-26 LAB — HEPATIC FUNCTION PANEL
Albumin: 4 g/dL (ref 3.5–5.2)
Alkaline Phosphatase: 57 U/L (ref 39–117)
Total Bilirubin: 0.7 mg/dL (ref 0.3–1.2)

## 2012-10-26 MED ORDER — LOVASTATIN 20 MG PO TABS: 10.0000 mg | ORAL_TABLET | Freq: Every day | ORAL | Status: DC

## 2012-10-26 MED ORDER — MELOXICAM 7.5 MG PO TABS: 7.5000 mg | ORAL_TABLET | Freq: Every day | ORAL | Status: DC

## 2012-10-26 MED ORDER — CYCLOBENZAPRINE HCL 5 MG PO TABS: 5.0000 mg | ORAL_TABLET | Freq: Three times a day (TID) | ORAL | Status: DC | PRN

## 2012-10-26 MED ORDER — FLUTICASONE PROPIONATE 50 MCG/ACT NA SUSP: 1.0000 | Freq: Two times a day (BID) | NASAL | Status: DC

## 2012-10-26 NOTE — Addendum Note (Signed)
Addended by: Scharlene Gloss B on: 10/26/2012 10:54 AM   Modules accepted: Orders

## 2012-10-26 NOTE — Assessment & Plan Note (Signed)
Mild, for right wrist splint at night

## 2012-10-26 NOTE — Assessment & Plan Note (Signed)

## 2012-10-26 NOTE — Patient Instructions (Addendum)
You had the pneumonia shot today You will be contacted regarding the referral for: event monitor, and cardiology referral We will try to provide a right wrist splint to wear at night; if not this can be obtained at most pharmacies, to wear at night only;  If not improved, or gets worse we will eventually need to have you see Dr Turner Daniels or a Neurosurgeon for this Please continue your efforts at being more active, low cholesterol diet, and weight control. You are otherwise up to date with prevention measures today. Please go to the LAB in the Basement (turn left off the elevator) for the tests to be done today You will be contacted by phone if any changes need to be made immediately.  Otherwise, you will receive a letter about your results with an explanation, but please check with MyChart first.  Please remember to sign up for My Chart if you have not done so, as this will be important to you in the future with finding out test results, communicating by private email, and scheduling acute appointments online when needed.  Please return in 1 year for your yearly visit, or sooner if needed, with Lab testing done 3-5 days before

## 2012-10-26 NOTE — Assessment & Plan Note (Signed)
?   PAF vs SVT or sinus tach, for event monitor, refer cardiology
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
## 2012-10-27 ENCOUNTER — Encounter: Payer: Self-pay | Admitting: Internal Medicine

## 2012-10-27 LAB — PSA: PSA: 1.06 ng/mL (ref 0.10–4.00)

## 2012-10-28 ENCOUNTER — Ambulatory Visit (INDEPENDENT_AMBULATORY_CARE_PROVIDER_SITE_OTHER): Payer: Medicare PPO | Admitting: Cardiovascular Disease

## 2012-10-28 ENCOUNTER — Encounter: Payer: Self-pay | Admitting: Cardiovascular Disease

## 2012-10-28 VITALS — BP 134/90 | HR 93 | Ht 66.0 in | Wt 260.4 lb

## 2012-10-28 DIAGNOSIS — E785 Hyperlipidemia, unspecified: Secondary | ICD-10-CM

## 2012-10-28 DIAGNOSIS — E119 Type 2 diabetes mellitus without complications: Secondary | ICD-10-CM

## 2012-10-28 DIAGNOSIS — R002 Palpitations: Secondary | ICD-10-CM

## 2012-10-28 NOTE — Patient Instructions (Signed)
Your physician recommends that you schedule a follow-up appointment in: AS NEEDED Your physician recommends that you continue on your current medications as directed. Please refer to the Current Medication list given to you today.  PT TO CALL IF SYMPTOMS INCREASE WILL THEN GET MONITOR AT THAT TIME

## 2012-10-28 NOTE — Assessment & Plan Note (Signed)
Benign and infrequent  Can order event monitor in future if they start occuring weekly.  Normal stress myovue in 2013 reassuring No murmur on exam so echo not needed. Observe
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
## 2012-10-28 NOTE — Assessment & Plan Note (Signed)
Discussed low carb diet.  Target hemoglobin A1c is 6.5 or less.  Continue current medications.  

## 2012-10-28 NOTE — Assessment & Plan Note (Signed)
Cholesterol is at goal.  Continue current dose of statin and diet Rx.  No myalgias or side effects.  F/U  LFT's in 6 months. Lab Results  Component Value Date   LDLCALC 94 10/26/2012

## 2013-06-02 ENCOUNTER — Encounter: Payer: Self-pay | Admitting: Internal Medicine

## 2013-06-02 ENCOUNTER — Telehealth: Payer: Self-pay | Admitting: *Deleted

## 2013-06-02 NOTE — Telephone Encounter (Signed)
Pt called states he needs a letter stating he is not a diabetic.  This letter is for his insurance company.  Please advise

## 2013-06-02 NOTE — Telephone Encounter (Signed)
Letter done

## 2013-06-02 NOTE — Telephone Encounter (Signed)
Called the patient and mailed letter as requested to [facility], [address] [place] [id number], attn: Arita Miss.

## 2014-01-06 ENCOUNTER — Ambulatory Visit (INDEPENDENT_AMBULATORY_CARE_PROVIDER_SITE_OTHER): Payer: Medicare PPO | Admitting: Internal Medicine

## 2014-01-06 ENCOUNTER — Ambulatory Visit (INDEPENDENT_AMBULATORY_CARE_PROVIDER_SITE_OTHER)
Admission: RE | Admit: 2014-01-06 | Discharge: 2014-01-06 | Disposition: A | Discharge status: Home / Self Care | Payer: Medicare PPO | Source: Ambulatory Visit | Attending: Internal Medicine | Admitting: Internal Medicine

## 2014-01-06 ENCOUNTER — Encounter: Payer: Self-pay | Admitting: Internal Medicine

## 2014-01-06 ENCOUNTER — Other Ambulatory Visit (INDEPENDENT_AMBULATORY_CARE_PROVIDER_SITE_OTHER): Payer: Medicare PPO

## 2014-01-06 VITALS — HR 96 | Temp 98.0°F | Ht 67.0 in | Wt 256.4 lb

## 2014-01-06 DIAGNOSIS — Z125 Encounter for screening for malignant neoplasm of prostate: Secondary | ICD-10-CM

## 2014-01-06 DIAGNOSIS — Z0000 Encounter for general adult medical examination without abnormal findings: Secondary | ICD-10-CM

## 2014-01-06 DIAGNOSIS — Z23 Encounter for immunization: Secondary | ICD-10-CM

## 2014-01-06 DIAGNOSIS — R7302 Impaired glucose tolerance (oral): Secondary | ICD-10-CM

## 2014-01-06 DIAGNOSIS — R1030 Lower abdominal pain, unspecified: Secondary | ICD-10-CM

## 2014-01-06 DIAGNOSIS — J3089 Other allergic rhinitis: Secondary | ICD-10-CM

## 2014-01-06 DIAGNOSIS — R1032 Left lower quadrant pain: Secondary | ICD-10-CM

## 2014-01-06 DIAGNOSIS — E785 Hyperlipidemia, unspecified: Secondary | ICD-10-CM

## 2014-01-06 LAB — CBC WITH DIFFERENTIAL/PLATELET
BASOS PCT: 0.9 % (ref 0.0–3.0)
Basophils Absolute: 0.1 10*3/uL (ref 0.0–0.1)
EOS ABS: 0.1 10*3/uL (ref 0.0–0.7)
Eosinophils Relative: 1.7 % (ref 0.0–5.0)
HCT: 51.7 % (ref 39.0–52.0)
Hemoglobin: 17.2 g/dL — ABNORMAL HIGH (ref 13.0–17.0)
LYMPHS ABS: 2.4 10*3/uL (ref 0.7–4.0)
Lymphocytes Relative: 31.7 % (ref 12.0–46.0)
MCHC: 33.3 g/dL (ref 30.0–36.0)
MCV: 90.8 fl (ref 78.0–100.0)
Monocytes Absolute: 0.8 10*3/uL (ref 0.1–1.0)
Monocytes Relative: 10.7 % (ref 3.0–12.0)
NEUTROS ABS: 4.2 10*3/uL (ref 1.4–7.7)
NEUTROS PCT: 55 % (ref 43.0–77.0)
Platelets: 209 10*3/uL (ref 150.0–400.0)
RBC: 5.69 Mil/uL (ref 4.22–5.81)
RDW: 13.2 % (ref 11.5–15.5)
WBC: 7.7 10*3/uL (ref 4.0–10.5)

## 2014-01-06 LAB — BASIC METABOLIC PANEL
BUN: 17 mg/dL (ref 6–23)
CHLORIDE: 106 meq/L (ref 96–112)
CO2: 22 mEq/L (ref 19–32)
CREATININE: 0.8 mg/dL (ref 0.4–1.5)
Calcium: 9.4 mg/dL (ref 8.4–10.5)
GFR: 101.01 mL/min (ref >60.00)
Glucose, Bld: 90 mg/dL (ref 70–99)
POTASSIUM: 4.5 meq/L (ref 3.5–5.1)
Sodium: 140 mEq/L (ref 135–145)

## 2014-01-06 LAB — URINALYSIS, ROUTINE W REFLEX MICROSCOPIC
Bilirubin Urine: NEGATIVE
KETONES UR: NEGATIVE
NITRITE: NEGATIVE
PH: 6 (ref 5.0–8.0)
SPECIFIC GRAVITY, URINE: 1.025 (ref 1.000–1.030)
TOTAL PROTEIN, URINE-UPE24: 30 — AB
Urine Glucose: NEGATIVE
Urobilinogen, UA: 0.2 (ref 0.0–1.0)

## 2014-01-06 LAB — HEPATIC FUNCTION PANEL
ALT: 21 U/L (ref 0–53)
AST: 17 U/L (ref 0–37)
Albumin: 3.4 g/dL — ABNORMAL LOW (ref 3.5–5.2)
Alkaline Phosphatase: 61 U/L (ref 39–117)
BILIRUBIN TOTAL: 0.7 mg/dL (ref 0.2–1.2)
Bilirubin, Direct: 0.1 mg/dL (ref 0.0–0.3)
Total Protein: 6.4 g/dL (ref 6.0–8.3)

## 2014-01-06 LAB — PSA: PSA: 3.01 ng/mL (ref 0.10–4.00)

## 2014-01-06 LAB — LIPID PANEL
CHOL/HDL RATIO: 6
Cholesterol: 189 mg/dL (ref 0–200)
HDL: 30.1 mg/dL — ABNORMAL LOW (ref >39.00)
LDL Cholesterol: 132 mg/dL — ABNORMAL HIGH (ref 0–99)
NonHDL: 158.9
TRIGLYCERIDES: 134 mg/dL (ref 0.0–149.0)
VLDL: 26.8 mg/dL (ref 0.0–40.0)

## 2014-01-06 LAB — TSH: TSH: 1.07 u[IU]/mL (ref 0.35–4.50)

## 2014-01-06 LAB — HEMOGLOBIN A1C: Hgb A1c MFr Bld: 5.8 % (ref 4.6–6.5)

## 2014-01-06 MED ORDER — MELOXICAM 15 MG PO TABS: 15.0000 mg | ORAL_TABLET | Freq: Every day | ORAL | Status: DC

## 2014-01-06 MED ORDER — TIZANIDINE HCL 4 MG PO TABS: 4.0000 mg | ORAL_TABLET | Freq: Four times a day (QID) | ORAL | Status: DC | PRN

## 2014-01-06 MED ORDER — FLUTICASONE PROPIONATE 50 MCG/ACT NA SUSP: 1.0000 | Freq: Every day | NASAL | Status: DC

## 2014-01-06 MED ORDER — OXYCODONE-ACETAMINOPHEN 5-325 MG PO TABS: 1.0000 | ORAL_TABLET | ORAL | Status: DC | PRN
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
## 2014-01-06 NOTE — Assessment & Plan Note (Signed)

## 2014-01-06 NOTE — Assessment & Plan Note (Signed)
stable overall by history and exam, recent data reviewed with pt, and pt to continue medical treatment as before,  to f/u any worsening symptoms or concerns, for flonase refill

## 2014-01-06 NOTE — Patient Instructions (Addendum)
You had the flu shot today  Please return for Nurse Visit for the new Prevnar pneumonia shot in 2 weeks  Please continue all other medications as before, and refills have been done if requested - the oxycodone  OK to stop the flexeril  Please take all new medication as prescribed - the tizanidine  Please have the pharmacy call with any other refills you may need.  Please continue your efforts at being more active, low cholesterol diet, and weight control.  You are otherwise up to date with prevention measures today.  Please keep your appointments with your specialists as you may have planned  Please go to the XRAY Department in the Basement (go straight as you get off the elevator) for the x-ray testing  Please go to the LAB in the Basement (turn left off the elevator) for the tests to be done today  You will be contacted by phone if any changes need to be made immediately.  Otherwise, you will receive a letter about your results with an explanation, but please check with MyChart first.  Please remember to sign up for MyChart if you have not done so, as this will be important to you in the future with finding out test results, communicating by private email, and scheduling acute appointments online when needed.  Please return in 1 year for your yearly visit, or sooner if needed, with Lab testing done 3-5 days before

## 2014-01-06 NOTE — Assessment & Plan Note (Signed)
Etiology unclear, cant r/o AVN, hip DJD, pagets or neuritic pain lumbar, For oxycodone prn, check films, for f/u Ortho  - pt prefers to call for appt; also for change flexeril to tizanidine due to less risk med

## 2014-01-06 NOTE — Assessment & Plan Note (Signed)
Asympt, for f/u a1c today, cont diet and wt loss efforts

## 2014-01-07 ENCOUNTER — Encounter: Payer: Self-pay | Admitting: Internal Medicine

## 2014-01-07 ENCOUNTER — Other Ambulatory Visit: Payer: Self-pay | Admitting: Internal Medicine

## 2014-01-07 DIAGNOSIS — N390 Urinary tract infection, site not specified: Secondary | ICD-10-CM

## 2014-01-07 DIAGNOSIS — E785 Hyperlipidemia, unspecified: Secondary | ICD-10-CM

## 2014-01-07 DIAGNOSIS — R972 Elevated prostate specific antigen [PSA]: Secondary | ICD-10-CM

## 2014-01-07 MED ORDER — SULFAMETHOXAZOLE-TRIMETHOPRIM 800-160 MG PO TABS: 1.0000 | ORAL_TABLET | Freq: Two times a day (BID) | ORAL | Status: DC

## 2014-01-11 ENCOUNTER — Telehealth: Payer: Self-pay | Admitting: Internal Medicine

## 2014-01-11 NOTE — Telephone Encounter (Signed)
Patient stated that he picked up medication Bactrim from his pharmacy, and doesn't know what it is for, Please advise

## 2014-01-12 NOTE — Progress Notes (Signed)
See emr, this has been addressed, to hold his chronic daily doxy done for right knee post surgury 2009 - hold for one month , ok to take the bactrim x 1 mo, then re-start doxy after that

## 2014-01-12 NOTE — Telephone Encounter (Signed)
Patient informed of results of urine test and to take Bactrim for 4 weeks.

## 2014-01-13 ENCOUNTER — Other Ambulatory Visit: Payer: Self-pay | Admitting: Gastroenterology

## 2014-01-20 ENCOUNTER — Ambulatory Visit (INDEPENDENT_AMBULATORY_CARE_PROVIDER_SITE_OTHER): Payer: Medicare PPO | Admitting: *Deleted

## 2014-01-20 DIAGNOSIS — Z23 Encounter for immunization: Secondary | ICD-10-CM

## 2014-02-11 ENCOUNTER — Other Ambulatory Visit (INDEPENDENT_AMBULATORY_CARE_PROVIDER_SITE_OTHER): Payer: Medicare PPO

## 2014-02-11 ENCOUNTER — Encounter: Payer: Self-pay | Admitting: Internal Medicine

## 2014-02-11 DIAGNOSIS — R972 Elevated prostate specific antigen [PSA]: Secondary | ICD-10-CM

## 2014-02-11 DIAGNOSIS — E785 Hyperlipidemia, unspecified: Secondary | ICD-10-CM

## 2014-02-11 DIAGNOSIS — R7302 Impaired glucose tolerance (oral): Secondary | ICD-10-CM

## 2014-02-11 DIAGNOSIS — N390 Urinary tract infection, site not specified: Secondary | ICD-10-CM

## 2014-02-11 DIAGNOSIS — Z0000 Encounter for general adult medical examination without abnormal findings: Secondary | ICD-10-CM

## 2014-02-11 LAB — URINALYSIS, ROUTINE W REFLEX MICROSCOPIC
BILIRUBIN URINE: NEGATIVE
Ketones, ur: NEGATIVE
LEUKOCYTES UA: NEGATIVE
Nitrite: NEGATIVE
PH: 6.5 (ref 5.0–8.0)
Specific Gravity, Urine: 1.02 (ref 1.000–1.030)
Total Protein, Urine: NEGATIVE
UROBILINOGEN UA: 0.2 (ref 0.0–1.0)
Urine Glucose: NEGATIVE

## 2014-02-11 LAB — HEPATIC FUNCTION PANEL
ALBUMIN: 4 g/dL (ref 3.5–5.2)
ALK PHOS: 57 U/L (ref 39–117)
ALT: 24 U/L (ref 0–53)
AST: 24 U/L (ref 0–37)
BILIRUBIN DIRECT: 0.1 mg/dL (ref 0.0–0.3)
TOTAL PROTEIN: 6.3 g/dL (ref 6.0–8.3)
Total Bilirubin: 0.6 mg/dL (ref 0.2–1.2)

## 2014-02-11 LAB — LIPID PANEL
CHOLESTEROL: 164 mg/dL (ref 0–200)
HDL: 36.7 mg/dL — ABNORMAL LOW (ref >39.00)
LDL Cholesterol: 101 mg/dL — ABNORMAL HIGH (ref 0–99)
NonHDL: 127.3
TRIGLYCERIDES: 130 mg/dL (ref 0.0–149.0)
Total CHOL/HDL Ratio: 4
VLDL: 26 mg/dL (ref 0.0–40.0)

## 2014-02-11 LAB — PSA: PSA: 1.8 ng/mL (ref 0.10–4.00)

## 2014-02-11 LAB — HEMOGLOBIN A1C: Hgb A1c MFr Bld: 6 % (ref 4.6–6.5)

## 2014-02-12 LAB — URINE CULTURE
COLONY COUNT: NO GROWTH
ORGANISM ID, BACTERIA: NO GROWTH

## 2014-02-14 ENCOUNTER — Other Ambulatory Visit: Payer: Self-pay | Admitting: Internal Medicine

## 2014-02-14 DIAGNOSIS — R3129 Other microscopic hematuria: Secondary | ICD-10-CM

## 2014-02-28 ENCOUNTER — Encounter: Payer: Self-pay | Admitting: Internal Medicine

## 2014-03-07 ENCOUNTER — Other Ambulatory Visit: Payer: Self-pay | Admitting: Urology

## 2014-03-07 ENCOUNTER — Ambulatory Visit (HOSPITAL_BASED_OUTPATIENT_CLINIC_OR_DEPARTMENT_OTHER): Payer: Medicare PPO | Admitting: Anesthesiology

## 2014-03-07 ENCOUNTER — Ambulatory Visit (HOSPITAL_BASED_OUTPATIENT_CLINIC_OR_DEPARTMENT_OTHER)
Admission: RE | Admit: 2014-03-07 | Discharge: 2014-03-07 | Disposition: A | Discharge status: Home / Self Care | Payer: Medicare PPO | Source: Ambulatory Visit | Attending: Urology | Admitting: Urology

## 2014-03-07 ENCOUNTER — Encounter (HOSPITAL_BASED_OUTPATIENT_CLINIC_OR_DEPARTMENT_OTHER): Payer: Self-pay | Admitting: Anesthesiology

## 2014-03-07 ENCOUNTER — Encounter (HOSPITAL_BASED_OUTPATIENT_CLINIC_OR_DEPARTMENT_OTHER)
Admission: RE | Disposition: A | Discharge status: Home / Self Care | Payer: Self-pay | Source: Ambulatory Visit | Attending: Urology

## 2014-03-07 DIAGNOSIS — K219 Gastro-esophageal reflux disease without esophagitis: Secondary | ICD-10-CM | POA: Insufficient documentation

## 2014-03-07 DIAGNOSIS — N202 Calculus of kidney with calculus of ureter: Secondary | ICD-10-CM | POA: Insufficient documentation

## 2014-03-07 DIAGNOSIS — N4340 Spermatocele of epididymis, unspecified: Secondary | ICD-10-CM | POA: Insufficient documentation

## 2014-03-07 DIAGNOSIS — E119 Type 2 diabetes mellitus without complications: Secondary | ICD-10-CM | POA: Insufficient documentation

## 2014-03-07 HISTORY — DX: Other specified postprocedural states: Z98.890

## 2014-03-07 HISTORY — DX: Personal history of other malignant neoplasm of skin: Z85.828

## 2014-03-07 HISTORY — DX: Obstructive sleep apnea (adult) (pediatric): G47.33

## 2014-03-07 HISTORY — PX: HOLMIUM LASER APPLICATION: SHX5852

## 2014-03-07 LAB — POCT I-STAT 4, (NA,K, GLUC, HGB,HCT)
GLUCOSE: 109 mg/dL — AB (ref 70–99)
HCT: 48 % (ref 39.0–52.0)
Hemoglobin: 16.3 g/dL (ref 13.0–17.0)
Potassium: 4.4 mmol/L (ref 3.5–5.1)
Sodium: 139 mmol/L (ref 135–145)

## 2014-03-07 LAB — GLUCOSE, CAPILLARY: Glucose-Capillary: 125 mg/dL — ABNORMAL HIGH (ref 70–99)

## 2014-03-07 SURGERY — CYSTOSCOPY/URETEROSCOPY/HOLMIUM LASER/STENT PLACEMENT
Anesthesia: General | Site: Ureter | Laterality: Left

## 2014-03-07 MED ORDER — SUCCINYLCHOLINE CHLORIDE 20 MG/ML IJ SOLN
INTRAMUSCULAR | Status: DC | PRN
  Administered 2014-03-07: 160 mg via INTRAVENOUS

## 2014-03-07 MED ORDER — CIPROFLOXACIN IN D5W 400 MG/200ML IV SOLN
400.0000 mg | INTRAVENOUS | Status: AC
  Administered 2014-03-07: 400 mg via INTRAVENOUS
  Filled 2014-03-07: qty 200

## 2014-03-07 MED ORDER — MIDAZOLAM HCL 2 MG/2ML IJ SOLN
INTRAMUSCULAR | Status: AC
  Filled 2014-03-07: qty 2

## 2014-03-07 MED ORDER — EPHEDRINE SULFATE 50 MG/ML IJ SOLN
INTRAMUSCULAR | Status: DC | PRN
  Administered 2014-03-07 (×3): 10 mg via INTRAVENOUS

## 2014-03-07 MED ORDER — FENTANYL CITRATE 0.05 MG/ML IJ SOLN
INTRAMUSCULAR | Status: AC
  Filled 2014-03-07: qty 6

## 2014-03-07 MED ORDER — ONDANSETRON HCL 4 MG/2ML IJ SOLN
INTRAMUSCULAR | Status: DC | PRN
  Administered 2014-03-07: 4 mg via INTRAVENOUS

## 2014-03-07 MED ORDER — MIDAZOLAM HCL 5 MG/5ML IJ SOLN
INTRAMUSCULAR | Status: DC | PRN
  Administered 2014-03-07 (×2): 1 mg via INTRAVENOUS

## 2014-03-07 MED ORDER — DEXAMETHASONE SODIUM PHOSPHATE 10 MG/ML IJ SOLN
INTRAMUSCULAR | Status: DC | PRN
  Administered 2014-03-07: 4 mg via INTRAVENOUS

## 2014-03-07 MED ORDER — FENTANYL CITRATE 0.05 MG/ML IJ SOLN
INTRAMUSCULAR | Status: DC | PRN
Start: 2014-03-07 — End: 2014-03-07
  Administered 2014-03-07 (×6): 12.5 ug via INTRAVENOUS
  Administered 2014-03-07: 50 ug via INTRAVENOUS
  Administered 2014-03-07 (×6): 12.5 ug via INTRAVENOUS

## 2014-03-07 MED ORDER — ACETAMINOPHEN 10 MG/ML IV SOLN
INTRAVENOUS | Status: DC | PRN
  Administered 2014-03-07: 1000 mg via INTRAVENOUS

## 2014-03-07 MED ORDER — CIPROFLOXACIN IN D5W 400 MG/200ML IV SOLN
INTRAVENOUS | Status: AC
  Filled 2014-03-07: qty 200

## 2014-03-07 MED ORDER — LACTATED RINGERS IV SOLN
INTRAVENOUS | Status: DC | PRN
  Administered 2014-03-07 (×2): via INTRAVENOUS

## 2014-03-07 MED ORDER — URIBEL 118 MG PO CAPS: 1.0000 | ORAL_CAPSULE | Freq: Four times a day (QID) | ORAL | Status: DC | PRN

## 2014-03-07 MED ORDER — LACTATED RINGERS IV SOLN
INTRAVENOUS | Status: DC
  Administered 2014-03-07: 13:00:00 via INTRAVENOUS
  Filled 2014-03-07: qty 1000

## 2014-03-07 MED ORDER — KETOROLAC TROMETHAMINE 30 MG/ML IJ SOLN
INTRAMUSCULAR | Status: DC | PRN
  Administered 2014-03-07: 30 mg via INTRAVENOUS

## 2014-03-07 MED ORDER — PROPOFOL 10 MG/ML IV BOLUS
INTRAVENOUS | Status: DC | PRN
  Administered 2014-03-07: 250 mg via INTRAVENOUS

## 2014-03-07 MED ORDER — IOHEXOL 350 MG/ML SOLN
INTRAVENOUS | Status: DC | PRN
  Administered 2014-03-07: 7 mL

## 2014-03-07 MED ORDER — LIDOCAINE HCL (CARDIAC) 20 MG/ML IV SOLN
INTRAVENOUS | Status: DC | PRN
  Administered 2014-03-07: 80 mg via INTRAVENOUS

## 2014-03-07 MED ORDER — SODIUM CHLORIDE 0.9 % IR SOLN
Status: DC | PRN
  Administered 2014-03-07: 6500 mL

## 2014-03-07 SURGICAL SUPPLY — 21 items
APL SKNCLS STERI-STRIP NONHPOA (GAUZE/BANDAGES/DRESSINGS) ×1
BAG DRAIN URO-CYSTO SKYTR STRL (DRAIN) ×2 IMPLANT
BAG DRN UROCATH (DRAIN) ×1
BASKET STONE 1.7 NGAGE (UROLOGICAL SUPPLIES) ×1 IMPLANT
BENZOIN TINCTURE PRP APPL 2/3 (GAUZE/BANDAGES/DRESSINGS) ×1 IMPLANT
CANISTER SUCT LVC 12 LTR MEDI- (MISCELLANEOUS) ×1 IMPLANT
CATH URET 5FR 28IN OPEN ENDED (CATHETERS) ×1 IMPLANT
CLOTH BEACON ORANGE TIMEOUT ST (SAFETY) ×2 IMPLANT
DRAPE CAMERA CLOSED 9X96 (DRAPES) ×2 IMPLANT
FIBER LASER TRAC TIP (UROLOGICAL SUPPLIES) ×1 IMPLANT
GLOVE SURG SS PI 7.5 STRL IVOR (GLOVE) ×2 IMPLANT
GLOVE SURG SS PI 8.0 STRL IVOR (GLOVE) ×2 IMPLANT
GOWN STRL REUS W/ TWL XL LVL3 (GOWN DISPOSABLE) IMPLANT
GOWN STRL REUS W/TWL XL LVL3 (GOWN DISPOSABLE) ×4
GUIDEWIRE STR DUAL SENSOR (WIRE) ×2 IMPLANT
IV NS IRRIG 3000ML ARTHROMATIC (IV SOLUTION) ×3 IMPLANT
NS IRRIG 500ML POUR BTL (IV SOLUTION) ×1 IMPLANT
PACK CYSTO (CUSTOM PROCEDURE TRAY) ×2 IMPLANT
SHEATH ACCESS URETERAL 38CM (SHEATH) ×1 IMPLANT
SHEATH ACCESS URETERAL 54CM (SHEATH) ×1 IMPLANT
STENT URET 6FRX26 CONTOUR (STENTS) ×1 IMPLANT

## 2014-03-07 NOTE — Discharge Instructions (Addendum)
Ureteral Stent Implantation Ureteral stent implantation is the implantation of a soft plastic tube with multiple holes into the tube that drains urine from your kidney to your bladder (ureter). The stent helps drain your kidney when there is a blockage of the flow of urine in your ureter. The stent has a coil on each end to keep it from falling out. One end stays in the kidney. The other end stays in the bladder. It is most often taken out after any blockage has been removed or your ureter has healed. Short-term stents have a string attached to make removal quite easy. Removal of a short-term stent can be done in your health care provider's office or by you at home. Long-term stents need to be changed every few months. LET [facility] CARE PROVIDER KNOW ABOUT:  Any allergies you have.  All medicines you are taking, including vitamins, herbs, eye drops, creams, and over-the-counter medicines.  Previous problems you or members of your family have had with the use of anesthetics.  Any blood disorders you have.  Previous surgeries you have had.  Medical conditions you have. RISKS AND COMPLICATIONS Generally, ureteral stent implantation is a safe procedure. However, as with any procedure, complications can occur. Possible complications include:  Movement of the stent away from where it was originally placed (migration). This may affect the ability of the stent to properly drain your kidney. If migration of the stent occurs, the stent may need to be replaced or repositioned.  Perforation of the ureter.  Infection. BEFORE THE PROCEDURE  You may be asked to wash your genital area with sterile soap the morning of your procedure.  You may be given an oral antibiotic which you should take with a sip of water as prescribed by your health care provider.  You may be asked to not eat or drink for 8 hours before the surgery. PROCEDURE  First you will be given an anesthetic so you do not feel pain  during the procedure.  Your health care provider will insert a special lighted instrument called a cystoscope into your bladder. This allows your health care provider to see the opening to your ureter.  A thin wire is carefully threaded into your bladder and up the ureter. The stent is inserted over the wire and the wire is then removed.  Your bladder will be emptied of urine. AFTER THE PROCEDURE You will be taken to a recovery room until it is okay for you to go home. Document Released: 02/16/2000 Document Revised: 02/23/2013 Document Reviewed: 07/28/2012 [facility] Patient Information 2015 [place], [place]. This information is not intended to replace advice given to you by your health care provider. Make sure you discuss any questions you have with your health care provider.  You may remove the stent by pulling the attached string on Thursday morning.    Post Anesthesia Home Care Instructions  Activity: Get plenty of rest for the remainder of the day. A responsible adult should stay with you for 24 hours following the procedure.  For the next 24 hours, DO NOT: -Drive a car -Paediatric nurse -Drink alcoholic beverages -Take any medication unless instructed by your physician -Make any legal decisions or sign important papers.  Meals: Start with liquid foods such as gelatin or soup. Progress to regular foods as tolerated. Avoid greasy, spicy, heavy foods. If nausea and/or vomiting occur, drink only clear liquids until the nausea and/or vomiting subsides. Call your physician if vomiting continues.  Special Instructions/Symptoms: Your throat may feel  dry or sore from the anesthesia or the breathing tube placed in your throat during surgery. If this causes discomfort, gargle with warm salt water. The discomfort should disappear within 24 hours.

## 2014-03-07 NOTE — Anesthesia Procedure Notes (Signed)
Procedure Name: Intubation Date/Time: 03/07/2014 1:38 PM Performed by: Justice Rocher Pre-anesthesia Checklist: Patient identified, Emergency Drugs available, Suction available and Patient being monitored Patient Re-evaluated:Patient Re-evaluated prior to inductionOxygen Delivery Method: Circle System Utilized Preoxygenation: Pre-oxygenation with 100% oxygen Intubation Type: IV induction Ventilation: Mask ventilation without difficulty Laryngoscope Size: Mac and 4 Grade View: Grade III Tube type: Oral Tube size: 8.0 mm Number of attempts: 1 Airway Equipment and Method: stylet and oral airway Placement Confirmation: ETT inserted through vocal cords under direct vision,  positive ETCO2 and breath sounds checked- equal and bilateral Secured at: 23 cm Tube secured with: Tape Dental Injury: Teeth and Oropharynx as per pre-operative assessment  Comments: Pt positioned on grey wedge upper body support, Pre02 with O2 prior to induction, smooth IV induction intubated without incident. BBS equal ETT 8.0 - LB

## 2014-03-07 NOTE — Brief Op Note (Signed)
03/07/2014  2:36 PM  PATIENT:  Nelva Nay  69 y.o. male  PRE-OPERATIVE DIAGNOSIS:  LEFT PROXIMAL STONE  POST-OPERATIVE DIAGNOSIS:  LEFT PROXIMAL STONE  PROCEDURE:  Procedure(s): CYSTOSCOPY/URETEROSCOPY/HOLMIUM LASER, stone extraction, left retrograde pylegram/STENT PLACEMENT (Left) HOLMIUM LASER APPLICATION (Left)  SURGEON:  Surgeon(s) and Role:    * Malka So, MD - Primary  PHYSICIAN ASSISTANT:   ASSISTANTS: none   ANESTHESIA:   general  EBL:  Total I/O In: 200 [I.V.:200] Out: -   BLOOD ADMINISTERED:none  DRAINS: 6 x 26 left JJ stent   LOCAL MEDICATIONS USED:  NONE  SPECIMEN:  Source of Specimen:  left renal stone  DISPOSITION OF SPECIMEN:  to family to bring to office  COUNTS:  YES  TOURNIQUET:  * No tourniquets in log *  DICTATION: .Other Dictation: Dictation Number [id number]  PLAN OF CARE: Discharge to home after PACU  PATIENT DISPOSITION:  PACU - hemodynamically stable.   Delay start of Pharmacological VTE agent (>24hrs) due to surgical blood loss or risk of bleeding: not applicable

## 2014-03-07 NOTE — H&P (Signed)
Active Problems Problems  1. Kidney stone on left side (N20.0) 2. 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 He was seen by Dr. Jenny Reichmann who found hematuria and was treated with a month of bactrim.  He was seen in the [facility] ER yesterday and had a CT that showed an 89mm left UPJ stone. He is having some pain today but it is moderate now but has severe exacerbations. He had nausea and vomiting with the pain. He had hematuria Saturday night. He has a history of stones. He had a prior ESWL in 2002.   Past Medical History Problems  1. History of Accident caused by farm tractor ([place]) 2. History of arthritis (Z87.39) 3. History of esophageal reflux (Z87.19) 4. History of methicillin resistant Staphylococcus aureus infection (Z86.14) 5. History of sleep apnea (Z87.09) 6. History of Kidney stone on left side (N20.0)  Surgical History Problems  1. History of Gastric Surgery 2. History of Knee Surgery Right 3. History of Leg Repair 4. History of Lithotripsy 5. History of Removal Of Knee Prosthesis 6. History of Surgery Epididymis Excision Of Spermatocele Left  Current Meds 1. Centrum Silver Oral Tablet;  Therapy: (ATFTDDUK:02RKY7062) to Recorded 2. Cetirizine HCl - 10 MG Oral Tablet;  Therapy: (BJSEGBTD:17OHY0737) to Recorded 3. Doxycycline Hyclate 100 MG Oral Capsule;  Therapy: (TGGYIRSW:54OEV0350) to Recorded 4. Flonase 50 MCG/ACT SUSP;  Therapy: (KXFGHWEX:93ZJI9678) to Recorded 5. Meloxicam 15 MG Oral Tablet;  Therapy: (LFYBOFBP:10CHE5277) to Recorded 6. Oxycodone-Acetaminophen 5-325 MG Oral Tablet;  Therapy: (OEUMPNTI:14ERX5400) to Recorded 7. TiZANidine HCl TABS;  Therapy: (QQPYPPJK:93OIZ1245) to Recorded  Allergies Medication  1. No Known Drug Allergies  Family History Problems  1. Family history of Death : Mother, Father   mother passed 1989 2. Family history of  cerebral hemorrhage (Z82.0) : Father 3. Family history of diabetes mellitus (Z83.3) : Mother, Father, Sister, Brother 4. Family history of staphylococcal infection (Z83.1) : Mother  Social History Problems    Denied: History of Alcohol use   Denied: History of Caffeine use   Married   Never a smoker   Number of children   2 sons   Retired   Psychologist, sport and exercise  Review of Systems Genitourinary, constitutional, skin, eye, otolaryngeal, hematologic/lymphatic, cardiovascular, pulmonary, endocrine, musculoskeletal, gastrointestinal, neurological and psychiatric system(s) were reviewed and pertinent findings if present are noted and are otherwise negative.  Genitourinary: dysuria, nocturia, difficulty starting the urinary stream and hematuria.  Gastrointestinal: nausea, vomiting and flank pain.  Constitutional: no fever.  Cardiovascular: no chest pain.  Respiratory: no shortness of breath.    Vitals Vital Signs [Data Includes: Last 1 Day]  Recorded: [id number] 10:03AM  Height: 5 ft 6 in Weight: 260 lb  BMI Calculated: 41.97 BSA Calculated: 2.24 Blood Pressure: 138 / 91 Temperature: 98 F Heart Rate: 85  Physical Exam Constitutional: Well nourished and well developed . No acute distress.  ENT:. The ears and nose are normal in appearance.  Neck: The appearance of the neck is normal and no neck mass is present.  Pulmonary: No respiratory distress and normal respiratory rhythm and effort.  Cardiovascular: Heart rate and rhythm are normal . No peripheral edema.  Abdomen: The abdomen is obese. The abdomen is soft and nontender. No masses are palpated. moderate left CVA tenderness. No hernias are palpable. No hepatosplenomegaly noted.  Genitourinary: Examination of the penis demonstrates no discharge, no masses, no lesions and a normal meatus. The scrotum  is without lesions. The right epididymis is found to have a 3 cm spermatocele, but palpably normal and non-tender. The left epididymis is  found to have a 4 cm spermatocele, but palpably normal and non-tender. The right testis is non-tender and without masses. The left testis is non-tender and without masses.  Lymphatics: The posterior cervical and supraclavicular nodes are not enlarged or tender.  Skin: Normal skin turgor, no visible rash and no visible skin lesions.  Neuro/Psych:. Mood and affect are appropriate.    Results/Data Urine [Data Includes: Last 1 Day]   [id number]  COLOR YELLOW   APPEARANCE CLEAR   SPECIFIC GRAVITY 1.020   pH 5.5   GLUCOSE NEG mg/dL  BILIRUBIN NEG   KETONE NEG mg/dL  BLOOD TRACE   PROTEIN NEG mg/dL  UROBILINOGEN 0.2 mg/dL  NITRITE NEG   LEUKOCYTE ESTERASE NEG   SQUAMOUS EPITHELIAL/HPF RARE   WBC 0-2 WBC/hpf  RBC 3-6 RBC/hpf  BACTERIA RARE   CRYSTALS NONE SEEN   CASTS NONE SEEN    The following images/tracing/specimen were independently visualized:  I have reviewed his recent CT films and report. KUB today shows a 7x39mm stone at L2 on the left. No gas, bone or soft tissue abnormalties are noted other than mild lumbar degenerative disease.  The following clinical lab reports were reviewed:  UA and outside labs and UA's reviewed.    Assessment Assessed  1. Kidney stone on left side (N20.0) 2. 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   He has bilateral symptomatic spermatoceles.   Plan Health Maintenance  1. UA With REFLEX; [Do Not Release]; Status:Resulted - Requires Verification;   Done:  [id number] 09:41AM Kidney stone on left side  2. Follow-up Schedule Surgery Office  Follow-up  Status: Hold For - Appointment   Requested for: [id number] 3. Follow-up Weeks 1-2 Office  Follow-up  Status: Hold For - Appointment,Date of Service   Requested for: [id number] 4. KUB; Status:Resulted - Requires Verification;   Done: 16SAY3016 12:00AM Spermatocele  5. SCROTAL U/S; Status:Hold For -  Appointment,Date of Service; Requested  [phone];   I discussed the options and will get him set up for attempted left ureteroscopic stone extraction with laser and stenting today. I reviewed the risks of bleeding, infection, ureteral and renal injury, need for a stent and possible secondary procedures, thrombotic events and anesthetic complications.  He will return in 1-2 weeks to discuss the spermatoceles and have a scrotal US.

## 2014-03-07 NOTE — Anesthesia Postprocedure Evaluation (Signed)
  Anesthesia Post-op Note  Patient: Eric Santos  Procedure(s) Performed: Procedure(s) (LRB): CYSTOSCOPY/URETEROSCOPY/HOLMIUM LASER, stone extraction, left retrograde pylegram/STENT PLACEMENT (Left) HOLMIUM LASER APPLICATION (Left)  Patient Location: PACU  Anesthesia Type: general  Level of Consciousness: awake and alert   Airway and Oxygen Therapy: Patient Spontanous Breathing  Post-op Pain: mild  Post-op Assessment: Post-op Vital signs reviewed, Patient's Cardiovascular Status Stable, Respiratory Function Stable, Patent Airway and No signs of Nausea or vomiting  Last Vitals:  Filed Vitals:   03/07/14 1630  BP: 136/90  Pulse: 88  Temp: 36.3 C  Resp: 18    Post-op Vital Signs: stable   Complications: No apparent anesthesia complications

## 2014-03-07 NOTE — Anesthesia Preprocedure Evaluation (Addendum)
Anesthesia Evaluation  Patient identified by MRN, date of birth, ID band Patient awake    Reviewed: Allergy & Precautions, NPO status , Patient's Chart, lab work & pertinent test results  Airway Mallampati: II  TM Distance: >3 FB Neck ROM: Full    Dental no notable dental hx.    Pulmonary shortness of breath, sleep apnea ,  breath sounds clear to auscultation  Pulmonary exam normal       Cardiovascular Exercise Tolerance: Good Rhythm:Regular Rate:Normal  ECHO: 2008: Normal LV function.  EKG: 2014: NSR   Neuro/Psych  Neuromuscular disease negative psych ROS   GI/Hepatic Neg liver ROS, hiatal hernia, GERD-  ,  Endo/Other  diabetes, Type 2Morbid obesity  Renal/GU negative Renal ROS  negative genitourinary   Musculoskeletal  (+) Arthritis -,   Abdominal   Peds negative pediatric ROS (+)  Hematology negative hematology ROS (+)   Anesthesia Other Findings   Reproductive/Obstetrics negative OB ROS                          Anesthesia Physical Anesthesia Plan  ASA: III  Anesthesia Plan: General   Post-op Pain Management:    Induction: Intravenous  Airway Management Planned: Oral ETT  Additional Equipment:   Intra-op Plan:   Post-operative Plan: Extubation in OR  Informed Consent: I have reviewed the patients History and Physical, chart, labs and discussed the procedure including the risks, benefits and alternatives for the proposed anesthesia with the patient or authorized representative who has indicated his/her understanding and acceptance.   Dental advisory given  Plan Discussed with: CRNA  Anesthesia Plan Comments:        Anesthesia Quick Evaluation

## 2014-03-07 NOTE — Transfer of Care (Signed)
Immediate Anesthesia Transfer of Care Note  Patient: Eric Santos  Procedure(s) Performed: Procedure(s) (LRB): CYSTOSCOPY/URETEROSCOPY/HOLMIUM LASER, stone extraction, left retrograde pylegram/STENT PLACEMENT (Left) HOLMIUM LASER APPLICATION (Left)  Patient Location: PACU  Anesthesia Type: General  Level of Consciousness: awake, sedated, patient cooperative and responds to stimulation  Airway & Oxygen Therapy: Patient Spontanous Breathing and Patient connected to face mask oxygen  Post-op Assessment: Report given to PACU RN, Post -op Vital signs reviewed and stable and Patient moving all extremities  Post vital signs: Reviewed and stable  Complications: No apparent anesthesia complications

## 2014-03-08 ENCOUNTER — Encounter (HOSPITAL_BASED_OUTPATIENT_CLINIC_OR_DEPARTMENT_OTHER): Payer: Self-pay | Admitting: Urology

## 2014-03-08 NOTE — Op Note (Signed)
NAMEALDINE, Eric Santos                ACCOUNT NO.:  [id number]  MEDICAL RECORD NO.:  [id number]  LOCATION:                                 FACILITY:  PHYSICIAN:  Marshall Cork. Jeffie Pollock, M.D.    DATE OF BIRTH:  1945/12/22  DATE OF PROCEDURE:  03/07/2014 DATE OF DISCHARGE:  03/07/2014                              OPERATIVE REPORT   PROCEDURE: 1. Cystoscopy with left retrograde pyelogram and interpretation. 2. Left ureteroscopic stone extraction with holmium lasertripsy and     placement of left double-J stent.  PREOPERATIVE DIAGNOSIS:  An 11 mm left UPJ stone.  POSTOPERATIVE DIAGNOSIS:  An 11 mm left UPJ stone.  SURGEON:  Marshall Cork. Jeffie Pollock, M.D.  ANESTHESIA:  General.  SPECIMEN:  Stone fragments.  DRAINS:  A 6-French 26-cm double-J stent.  BLOOD LOSS:  Minimal.  COMPLICATIONS:  None.  INDICATIONS:  Mr. Eric Santos is a 69 year old white male with a history of stones who presented today with an 11 mm left UPJ stone and persistent pain.  We discussed the options.  He did not feel he could delay till Thursday for lithotripsy, had meloxicam yesterday.  So after reviewing the options, he has elected to undergo cystoscopy with retrograde pyelogram and attempted ureteroscopy and placement of left double-J stent.  FINDINGS AND PROCEDURE:  He was taken to the operating room where he was given Cipro.  He was placed in lithotomy position.  He was fitted with PAS hose.  He was prepped with Betadine solution and draped in usual sterile fashion.  Cystoscopy was performed using 22-French scope and 12-degree lens. Examination revealed a normal urethra.  The external sphincter was intact.  The prostatic urethra was approximately 2 cm in length with bilobar hyperplasia with minimal obstruction.  Examination of bladder revealed mild trabeculation.  No tumors, stones, or inflammation were noted.  Ureteral orifices were unremarkable.  The left ureteral orifice was cannulated with 5-French  open-ended catheter and contrast was instilled.  This revealed a normal-appearing ureter up to a filling defect at the UPJ consistent with a stone.  There were some proximal dilation of the kidney; however, during the retrograde pyelogram, stone flushed back into the renal pelvis.  A Sensor guidewire was passed through the ureteral catheter to the kidney.  The ureteral catheter was removed along with the cystoscope. The inner core of a 12/14 ureteral access sheath was passed over the wire to the kidney.  The 38 cm sheath was then assembled and inserted, but it was too short so a 55 cm 12/14 sheath was then used in its place. Once in good position, the inner core and wire were removed.  The digital flexible ureteroscope was then inserted through the access sheath.  The stone was identified, and it was then engaged with a 200- micron TracTip Laser Fiber with the holmium set on 0.5 watts and 50 hertz.  The stone broke readily into small fragments.  Those fragments were then removed with an N-Gage basket.  One fragment required additional lasering to allow removal.  Once all significant fragments were removed, thorough inspection of lower mid and upper pole calices revealed only fine dust and it was felt that  no further retrieval was needed.  At this point, the guidewire was reinserted through the sheath, the sheath was removed.  The cystoscope was reinserted over the wire and a 6- [place] 26-cm contoured double-J stent with string was then inserted over the wire to the kidney.  The wire was removed leaving good coil in the kidney and a good coil in the bladder.  The bladder was drained.  The cystoscope was removed leaving the stent string exiting urethra.  The string was secured to the patient's penis with paper tape because of his allergy to other tapes.  He was taken down from lithotomy position, his anesthetic was reversed, he was moved to recovery room in stable condition.  There  were no complications.     Marshall Cork. Jeffie Pollock, M.D.     JJW/MEDQ  D:  03/07/2014  T:  03/08/2014  Job:  [id number]

## 2014-05-03 HISTORY — PX: CATARACT EXTRACTION W/ INTRAOCULAR LENS  IMPLANT, BILATERAL: SHX1307

## 2014-08-29 ENCOUNTER — Other Ambulatory Visit: Payer: Self-pay

## 2014-11-14 ENCOUNTER — Other Ambulatory Visit: Payer: Self-pay | Admitting: Urology

## 2015-01-11 ENCOUNTER — Ambulatory Visit (INDEPENDENT_AMBULATORY_CARE_PROVIDER_SITE_OTHER): Payer: Medicare PPO | Admitting: Internal Medicine

## 2015-01-11 ENCOUNTER — Encounter: Payer: Self-pay | Admitting: Internal Medicine

## 2015-01-11 ENCOUNTER — Other Ambulatory Visit: Payer: Self-pay | Admitting: Internal Medicine

## 2015-01-11 ENCOUNTER — Other Ambulatory Visit: Payer: Medicare PPO

## 2015-01-11 ENCOUNTER — Other Ambulatory Visit (INDEPENDENT_AMBULATORY_CARE_PROVIDER_SITE_OTHER): Payer: Medicare PPO

## 2015-01-11 VITALS — BP 120/86 | HR 78 | Temp 98.0°F | Ht 66.0 in | Wt 263.0 lb

## 2015-01-11 DIAGNOSIS — E785 Hyperlipidemia, unspecified: Secondary | ICD-10-CM

## 2015-01-11 DIAGNOSIS — Z0000 Encounter for general adult medical examination without abnormal findings: Secondary | ICD-10-CM

## 2015-01-11 DIAGNOSIS — R7302 Impaired glucose tolerance (oral): Secondary | ICD-10-CM

## 2015-01-11 DIAGNOSIS — Z23 Encounter for immunization: Secondary | ICD-10-CM

## 2015-01-11 LAB — BASIC METABOLIC PANEL
BUN: 18 mg/dL (ref 6–23)
CHLORIDE: 105 meq/L (ref 96–112)
CO2: 29 meq/L (ref 19–32)
Calcium: 9.6 mg/dL (ref 8.4–10.5)
Creatinine, Ser: 0.74 mg/dL (ref 0.40–1.50)
GFR: 111.78 mL/min (ref >60.00)
Glucose, Bld: 104 mg/dL — ABNORMAL HIGH (ref 70–99)
POTASSIUM: 5 meq/L (ref 3.5–5.1)
Sodium: 141 mEq/L (ref 135–145)

## 2015-01-11 LAB — LIPID PANEL
CHOL/HDL RATIO: 5
Cholesterol: 201 mg/dL — ABNORMAL HIGH (ref 0–200)
HDL: 37.8 mg/dL — AB (ref >39.00)
LDL Cholesterol: 137 mg/dL — ABNORMAL HIGH (ref 0–99)
NonHDL: 163.37
TRIGLYCERIDES: 132 mg/dL (ref 0.0–149.0)
VLDL: 26.4 mg/dL (ref 0.0–40.0)

## 2015-01-11 LAB — CBC WITH DIFFERENTIAL/PLATELET
BASOS PCT: 0.6 % (ref 0.0–3.0)
Basophils Absolute: 0 10*3/uL (ref 0.0–0.1)
EOS PCT: 3.5 % (ref 0.0–5.0)
Eosinophils Absolute: 0.2 10*3/uL (ref 0.0–0.7)
HEMATOCRIT: 51.1 % (ref 39.0–52.0)
Hemoglobin: 17.2 g/dL — ABNORMAL HIGH (ref 13.0–17.0)
Lymphocytes Relative: 32.4 % (ref 12.0–46.0)
Lymphs Abs: 2.1 10*3/uL (ref 0.7–4.0)
MCHC: 33.6 g/dL (ref 30.0–36.0)
MCV: 90.1 fl (ref 78.0–100.0)
MONO ABS: 0.8 10*3/uL (ref 0.1–1.0)
Monocytes Relative: 12.5 % — ABNORMAL HIGH (ref 3.0–12.0)
NEUTROS ABS: 3.3 10*3/uL (ref 1.4–7.7)
Neutrophils Relative %: 51 % (ref 43.0–77.0)
PLATELETS: 208 10*3/uL (ref 150.0–400.0)
RBC: 5.68 Mil/uL (ref 4.22–5.81)
RDW: 13.5 % (ref 11.5–15.5)
WBC: 6.5 10*3/uL (ref 4.0–10.5)

## 2015-01-11 LAB — URINALYSIS, ROUTINE W REFLEX MICROSCOPIC
Bilirubin Urine: NEGATIVE
HGB URINE DIPSTICK: NEGATIVE
Ketones, ur: NEGATIVE
Leukocytes, UA: NEGATIVE
NITRITE: NEGATIVE
PH: 7 (ref 5.0–8.0)
RBC / HPF: NONE SEEN (ref 0–?)
Specific Gravity, Urine: 1.02 (ref 1.000–1.030)
TOTAL PROTEIN, URINE-UPE24: NEGATIVE
URINE GLUCOSE: NEGATIVE
UROBILINOGEN UA: 0.2 (ref 0.0–1.0)

## 2015-01-11 LAB — HEPATIC FUNCTION PANEL
ALBUMIN: 4.2 g/dL (ref 3.5–5.2)
ALT: 17 U/L (ref 0–53)
AST: 18 U/L (ref 0–37)
Alkaline Phosphatase: 63 U/L (ref 39–117)
Bilirubin, Direct: 0.1 mg/dL (ref 0.0–0.3)
TOTAL PROTEIN: 6.4 g/dL (ref 6.0–8.3)
Total Bilirubin: 0.8 mg/dL (ref 0.2–1.2)

## 2015-01-11 LAB — PSA: PSA: 1.08 ng/mL (ref 0.10–4.00)

## 2015-01-11 LAB — TSH: TSH: 0.97 u[IU]/mL (ref 0.35–4.50)

## 2015-01-11 MED ORDER — ROSUVASTATIN CALCIUM 20 MG PO TABS: 20.0000 mg | ORAL_TABLET | Freq: Every day | ORAL | Status: DC

## 2015-01-11 MED ORDER — FLUTICASONE PROPIONATE 50 MCG/ACT NA SUSP: 1.0000 | Freq: Every day | NASAL | Status: DC

## 2015-01-11 MED ORDER — OXYCODONE-ACETAMINOPHEN 5-325 MG PO TABS: 1.0000 | ORAL_TABLET | ORAL | Status: DC | PRN

## 2015-01-11 MED ORDER — MELOXICAM 15 MG PO TABS: 15.0000 mg | ORAL_TABLET | Freq: Every day | ORAL | Status: DC

## 2015-01-11 NOTE — Assessment & Plan Note (Signed)
stable overall by history and exam, recent data reviewed with pt, and pt to continue medical treatment as before,  to f/u any worsening symptoms or concerns Lab Results  Component Value Date   HGBA1C 6.0 02/11/2014   For f/u lab today

## 2015-01-11 NOTE — Patient Instructions (Addendum)
You had the flu shot today  Please continue all other medications as before, and refills have been done if requested.  Please have the pharmacy call with any other refills you may need.  Please continue your efforts at being more active, low cholesterol diet, and weight control.  You are otherwise up to date with prevention measures today.  Please keep your appointments with your specialists as you may have planned  Please go to the LAB in the Basement (turn left off the elevator) for the tests to be done today  You will be contacted by phone if any changes need to be made immediately.  Otherwise, you will receive a letter about your results with an explanation, but please check with MyChart first.  Please remember to sign up for MyChart if you have not done so, as this will be important to you in the future with finding out test results, communicating by private email, and scheduling acute appointments online when needed.  Please return in 1 year for your yearly visit, or sooner if needed, with Lab testing done 3-5 days before  Good Luck with your Nov 29 surgury.
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
## 2015-01-11 NOTE — Progress Notes (Signed)
Pre visit review using our clinic review tool, if applicable. No additional management support is needed unless otherwise documented below in the visit note. 

## 2015-01-11 NOTE — Assessment & Plan Note (Signed)

## 2015-01-12 LAB — HEPATITIS C ANTIBODY: HCV Ab: NEGATIVE

## 2015-01-24 ENCOUNTER — Encounter (HOSPITAL_BASED_OUTPATIENT_CLINIC_OR_DEPARTMENT_OTHER): Payer: Self-pay | Admitting: *Deleted

## 2015-01-24 NOTE — Progress Notes (Signed)
NPO AFTER MN.  ARRIVE AT 0600.  NEEDS EKG.  CURRENT LAB RESULTS IN CHART AND EPIC.  WILL TAKE DOXYCYCLINE AND DO FLONASE SPRAY AM DOS W/ SIPS OF WATER AND IF NEEDED TAKE PAIN RX.
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
## 2015-01-31 ENCOUNTER — Encounter (HOSPITAL_BASED_OUTPATIENT_CLINIC_OR_DEPARTMENT_OTHER)
Admission: RE | Disposition: A | Discharge status: Home / Self Care | Payer: Self-pay | Source: Ambulatory Visit | Attending: Urology

## 2015-01-31 ENCOUNTER — Ambulatory Visit (HOSPITAL_BASED_OUTPATIENT_CLINIC_OR_DEPARTMENT_OTHER): Payer: Medicare PPO | Admitting: Anesthesiology

## 2015-01-31 ENCOUNTER — Other Ambulatory Visit: Payer: Self-pay

## 2015-01-31 ENCOUNTER — Ambulatory Visit (HOSPITAL_BASED_OUTPATIENT_CLINIC_OR_DEPARTMENT_OTHER)
Admission: RE | Admit: 2015-01-31 | Discharge: 2015-01-31 | Disposition: A | Discharge status: Home / Self Care | Payer: Medicare PPO | Source: Ambulatory Visit | Attending: Urology | Admitting: Urology

## 2015-01-31 ENCOUNTER — Encounter (HOSPITAL_BASED_OUTPATIENT_CLINIC_OR_DEPARTMENT_OTHER): Payer: Self-pay | Admitting: *Deleted

## 2015-01-31 DIAGNOSIS — N4342 Spermatocele of epididymis, multiple: Secondary | ICD-10-CM | POA: Insufficient documentation

## 2015-01-31 DIAGNOSIS — G4730 Sleep apnea, unspecified: Secondary | ICD-10-CM | POA: Diagnosis not present

## 2015-01-31 DIAGNOSIS — Z9849 Cataract extraction status, unspecified eye: Secondary | ICD-10-CM | POA: Insufficient documentation

## 2015-01-31 DIAGNOSIS — Z79891 Long term (current) use of opiate analgesic: Secondary | ICD-10-CM | POA: Insufficient documentation

## 2015-01-31 DIAGNOSIS — Z79899 Other long term (current) drug therapy: Secondary | ICD-10-CM | POA: Insufficient documentation

## 2015-01-31 DIAGNOSIS — Z7951 Long term (current) use of inhaled steroids: Secondary | ICD-10-CM | POA: Diagnosis not present

## 2015-01-31 DIAGNOSIS — K449 Diaphragmatic hernia without obstruction or gangrene: Secondary | ICD-10-CM | POA: Diagnosis not present

## 2015-01-31 DIAGNOSIS — G709 Myoneural disorder, unspecified: Secondary | ICD-10-CM | POA: Diagnosis not present

## 2015-01-31 DIAGNOSIS — Z791 Long term (current) use of non-steroidal anti-inflammatories (NSAID): Secondary | ICD-10-CM | POA: Insufficient documentation

## 2015-01-31 DIAGNOSIS — Z6841 Body Mass Index (BMI) 40.0 and over, adult: Secondary | ICD-10-CM | POA: Diagnosis not present

## 2015-01-31 DIAGNOSIS — E119 Type 2 diabetes mellitus without complications: Secondary | ICD-10-CM | POA: Diagnosis not present

## 2015-01-31 DIAGNOSIS — K219 Gastro-esophageal reflux disease without esophagitis: Secondary | ICD-10-CM | POA: Diagnosis not present

## 2015-01-31 DIAGNOSIS — M1990 Unspecified osteoarthritis, unspecified site: Secondary | ICD-10-CM | POA: Diagnosis not present

## 2015-01-31 HISTORY — DX: Allergic rhinitis, unspecified: J30.9

## 2015-01-31 HISTORY — DX: Personal history of urinary calculi: Z87.442

## 2015-01-31 HISTORY — DX: Type 2 diabetes mellitus without complications: E11.9

## 2015-01-31 HISTORY — DX: Personal history of other diseases of the digestive system: Z87.19

## 2015-01-31 HISTORY — DX: Spermatocele of epididymis, unspecified: N43.40

## 2015-01-31 HISTORY — PX: SPERMATOCELECTOMY: SHX2420

## 2015-01-31 HISTORY — DX: Personal history of other diseases of the circulatory system: Z86.79

## 2015-01-31 HISTORY — DX: Calculus of kidney: N20.0

## 2015-01-31 HISTORY — DX: Personal history of other (healed) physical injury and trauma: Z87.828

## 2015-01-31 HISTORY — DX: Cyst of kidney, acquired: N28.1

## 2015-01-31 HISTORY — DX: Personal history of peptic ulcer disease: Z87.11

## 2015-01-31 HISTORY — DX: Diverticulosis of large intestine without perforation or abscess without bleeding: K57.30

## 2015-01-31 HISTORY — DX: Unspecified osteoarthritis, unspecified site: M19.90

## 2015-01-31 HISTORY — DX: Male erectile dysfunction, unspecified: N52.9

## 2015-01-31 LAB — GLUCOSE, CAPILLARY
Glucose-Capillary: 108 mg/dL — ABNORMAL HIGH (ref 65–99)
Glucose-Capillary: 114 mg/dL — ABNORMAL HIGH (ref 65–99)

## 2015-01-31 SURGERY — EXCISION, SPERMATOCELE
Anesthesia: General | Site: Scrotum | Laterality: Bilateral

## 2015-01-31 MED ORDER — EPINEPHRINE HCL 0.1 MG/ML IJ SOSY
PREFILLED_SYRINGE | INTRAMUSCULAR | Status: AC
  Filled 2015-01-31: qty 20

## 2015-01-31 MED ORDER — PHENYLEPHRINE HCL 10 MG/ML IJ SOLN
INTRAMUSCULAR | Status: DC | PRN
  Administered 2015-01-31: 80 ug via INTRAVENOUS
  Administered 2015-01-31: 40 ug via INTRAVENOUS
  Administered 2015-01-31 (×3): 80 ug via INTRAVENOUS

## 2015-01-31 MED ORDER — CEFAZOLIN SODIUM-DEXTROSE 2-3 GM-% IV SOLR
2.0000 g | INTRAVENOUS | Status: AC
  Administered 2015-01-31: 2 g via INTRAVENOUS
  Filled 2015-01-31: qty 50

## 2015-01-31 MED ORDER — CEFAZOLIN SODIUM-DEXTROSE 2-3 GM-% IV SOLR
INTRAVENOUS | Status: AC
  Filled 2015-01-31: qty 50

## 2015-01-31 MED ORDER — DEXAMETHASONE SODIUM PHOSPHATE 10 MG/ML IJ SOLN
INTRAMUSCULAR | Status: AC
  Filled 2015-01-31: qty 1

## 2015-01-31 MED ORDER — FENTANYL CITRATE (PF) 100 MCG/2ML IJ SOLN
25.0000 ug | INTRAMUSCULAR | Status: DC | PRN
  Filled 2015-01-31: qty 1

## 2015-01-31 MED ORDER — OXYCODONE HCL 5 MG PO TABS
5.0000 mg | ORAL_TABLET | Freq: Once | ORAL | Status: DC | PRN
  Filled 2015-01-31: qty 1

## 2015-01-31 MED ORDER — FENTANYL CITRATE (PF) 100 MCG/2ML IJ SOLN
INTRAMUSCULAR | Status: AC
  Filled 2015-01-31: qty 2

## 2015-01-31 MED ORDER — BUPIVACAINE HCL (PF) 0.25 % IJ SOLN
INTRAMUSCULAR | Status: DC | PRN
  Administered 2015-01-31: 19 mL

## 2015-01-31 MED ORDER — PROPOFOL 10 MG/ML IV BOLUS
INTRAVENOUS | Status: DC | PRN
  Administered 2015-01-31: 300 mg via INTRAVENOUS

## 2015-01-31 MED ORDER — LACTATED RINGERS IV SOLN
INTRAVENOUS | Status: DC
  Administered 2015-01-31: 07:00:00 via INTRAVENOUS
  Filled 2015-01-31: qty 1000

## 2015-01-31 MED ORDER — LIDOCAINE HCL (CARDIAC) 20 MG/ML IV SOLN
INTRAVENOUS | Status: AC
  Filled 2015-01-31: qty 5

## 2015-01-31 MED ORDER — OXYCODONE HCL 5 MG/5ML PO SOLN
5.0000 mg | Freq: Once | ORAL | Status: DC | PRN
  Filled 2015-01-31: qty 5

## 2015-01-31 MED ORDER — LIDOCAINE HCL (CARDIAC) 20 MG/ML IV SOLN
INTRAVENOUS | Status: DC | PRN
  Administered 2015-01-31: 100 mg via INTRAVENOUS

## 2015-01-31 MED ORDER — OXYCODONE-ACETAMINOPHEN 5-325 MG PO TABS: 1.0000 | ORAL_TABLET | ORAL | Status: AC | PRN

## 2015-01-31 MED ORDER — DEXAMETHASONE SODIUM PHOSPHATE 4 MG/ML IJ SOLN
INTRAMUSCULAR | Status: DC | PRN
  Administered 2015-01-31: 5 mg via INTRAVENOUS

## 2015-01-31 MED ORDER — ONDANSETRON HCL 4 MG/2ML IJ SOLN
INTRAMUSCULAR | Status: DC | PRN
  Administered 2015-01-31: 4 mg via INTRAVENOUS

## 2015-01-31 MED ORDER — PROPOFOL 10 MG/ML IV BOLUS
INTRAVENOUS | Status: AC
  Filled 2015-01-31: qty 40

## 2015-01-31 MED ORDER — FENTANYL CITRATE (PF) 100 MCG/2ML IJ SOLN
INTRAMUSCULAR | Status: DC | PRN
  Administered 2015-01-31: 50 ug via INTRAVENOUS
  Administered 2015-01-31: 25 ug via INTRAVENOUS

## 2015-01-31 MED ORDER — ONDANSETRON HCL 4 MG/2ML IJ SOLN
INTRAMUSCULAR | Status: AC
  Filled 2015-01-31: qty 2

## 2015-01-31 MED ORDER — ONDANSETRON HCL 4 MG/2ML IJ SOLN
4.0000 mg | Freq: Four times a day (QID) | INTRAMUSCULAR | Status: DC | PRN
  Filled 2015-01-31: qty 2

## 2015-01-31 SURGICAL SUPPLY — 35 items
BLADE SURG 15 STRL LF DISP TIS (BLADE) ×1 IMPLANT
BLADE SURG 15 STRL SS (BLADE) ×2
BNDG GAUZE ELAST 4 BULKY (GAUZE/BANDAGES/DRESSINGS) ×2 IMPLANT
CANISTER SUCTION 2500CC (MISCELLANEOUS) ×1 IMPLANT
CLEANER CAUTERY TIP 5X5 PAD (MISCELLANEOUS) ×1 IMPLANT
COVER BACK TABLE 60X90IN (DRAPES) ×2 IMPLANT
COVER LIGHT HANDLE  1/PK (MISCELLANEOUS) ×1
COVER LIGHT HANDLE 1/PK (MISCELLANEOUS) IMPLANT
COVER MAYO STAND STRL (DRAPES) ×2 IMPLANT
DRAPE PED LAPAROTOMY (DRAPES) ×2 IMPLANT
ELECT REM PT RETURN 9FT ADLT (ELECTROSURGICAL) ×2
ELECTRODE REM PT RTRN 9FT ADLT (ELECTROSURGICAL) ×1 IMPLANT
GLOVE BIO SURGEON STRL SZ 6.5 (GLOVE) ×1 IMPLANT
GLOVE BIOGEL PI IND STRL 6.5 (GLOVE) IMPLANT
GLOVE BIOGEL PI INDICATOR 6.5 (GLOVE) ×1
GLOVE ECLIPSE 6.5 STRL STRAW (GLOVE) ×1 IMPLANT
GLOVE SURG SS PI 8.0 STRL IVOR (GLOVE) ×2 IMPLANT
GOWN STRL REUS W/ TWL LRG LVL3 (GOWN DISPOSABLE) IMPLANT
GOWN STRL REUS W/TWL LRG LVL3 (GOWN DISPOSABLE) ×6
KIT ROOM TURNOVER WOR (KITS) ×2 IMPLANT
LIQUID BAND (GAUZE/BANDAGES/DRESSINGS) ×1 IMPLANT
NEEDLE HYPO 22GX1.5 SAFETY (NEEDLE) ×2 IMPLANT
NS IRRIG 500ML POUR BTL (IV SOLUTION) ×1 IMPLANT
PACK BASIN DAY SURGERY FS (CUSTOM PROCEDURE TRAY) ×2 IMPLANT
PAD CLEANER CAUTERY TIP 5X5 (MISCELLANEOUS) ×1
PENCIL BUTTON HOLSTER BLD 10FT (ELECTRODE) ×2 IMPLANT
SUPPORT SCROTAL LG STRP (MISCELLANEOUS) ×2 IMPLANT
SUT CHROMIC 3 0 SH 27 (SUTURE) ×4 IMPLANT
SUT VICRYL 0 TIES 12 18 (SUTURE) ×2 IMPLANT
SYR BULB IRRIGATION 50ML (SYRINGE) ×1 IMPLANT
SYRINGE CONTROL L 12CC (SYRINGE) ×2 IMPLANT
SYRINGE CONTROL LL 12CC (SYRINGE) ×1 IMPLANT
TRAY DSU PREP LF (CUSTOM PROCEDURE TRAY) ×2 IMPLANT
TUBE CONNECTING 12X1/4 (SUCTIONS) ×1 IMPLANT
YANKAUER SUCT BULB TIP NO VENT (SUCTIONS) ×1 IMPLANT

## 2015-01-31 NOTE — Anesthesia Procedure Notes (Signed)
Procedure Name: LMA Insertion Date/Time: 01/31/2015 7:30 AM Performed by: Bethena Roys T Pre-anesthesia Checklist: Patient identified, Emergency Drugs available, Suction available and Patient being monitored Patient Re-evaluated:Patient Re-evaluated prior to inductionOxygen Delivery Method: Circle System Utilized Preoxygenation: Pre-oxygenation with 100% oxygen Intubation Type: IV induction Ventilation: Mask ventilation without difficulty LMA: LMA with gastric port inserted LMA Size: 5.0 Number of attempts: 1 Placement Confirmation: positive ETCO2 Tube secured with: Tape Dental Injury: Teeth and Oropharynx as per pre-operative assessment

## 2015-01-31 NOTE — Anesthesia Preprocedure Evaluation (Signed)
Anesthesia Evaluation  Patient identified by MRN, date of birth, ID band Patient awake    Reviewed: Allergy & Precautions, NPO status , Patient's Chart, lab work & pertinent test results  Airway Mallampati: II   Neck ROM: full    Dental   Pulmonary shortness of breath, sleep apnea ,    breath sounds clear to auscultation       Cardiovascular negative cardio ROS   Rhythm:regular Rate:Normal     Neuro/Psych  Neuromuscular disease    GI/Hepatic hiatal hernia, GERD  ,  Endo/Other  diabetes, Type 2Morbid obesity  Renal/GU      Musculoskeletal  (+) Arthritis , Osteoarthritis,    Abdominal   Peds  Hematology   Anesthesia Other Findings   Reproductive/Obstetrics                             Anesthesia Physical Anesthesia Plan  ASA: II  Anesthesia Plan: General   Post-op Pain Management:    Induction: Intravenous  Airway Management Planned: LMA  Additional Equipment:   Intra-op Plan:   Post-operative Plan:   Informed Consent: I have reviewed the patients History and Physical, chart, labs and discussed the procedure including the risks, benefits and alternatives for the proposed anesthesia with the patient or authorized representative who has indicated his/her understanding and acceptance.     Plan Discussed with: CRNA, Anesthesiologist and Surgeon  Anesthesia Plan Comments:         Anesthesia Quick Evaluation

## 2015-01-31 NOTE — Interval H&P Note (Signed)
History and Physical Interval Note:  01/31/2015 7:16 AM  Eric Santos  has presented today for surgery, with the diagnosis of BILATERAL SPERMATOCELES  The various methods of treatment have been discussed with the patient and family. After consideration of risks, benefits and other options for treatment, the patient has consented to  Procedure(s): SPERMATOCELECTOMY (Bilateral) as a surgical intervention .  The patient's history has been reviewed, patient examined, no change in status, stable for surgery.  I have reviewed the patient's chart and labs.  Questions were answered to the patient's satisfaction.     Khaza Blansett J

## 2015-01-31 NOTE — Op Note (Signed)
Operative Note:  Preoperative Diagnosis: Bilateral spermatoceles  Postoperative Diagnosis:  Same  Procedure(s) Performed:   1. Bilateral spermatocelectomy  Teaching Surgeon:  Irine Seal, MD  Resident Surgeon:  E. Harvel Ricks, MD  Assistant(s):  None  Anesthesia:  General via LMA  Fluids:  See anesthesia record  Estimated blood loss:  10 mL  Specimens:  Bilateral spermatocele sacs sent for final pathology  Cultures:  None  Drains:  None  Complications:  None  Indications: 69 yo male with large bilateral symptomatic spermatoceles requesting repair.  Findings:  Large left multi-loculated spermatoceles. Right medium loculated spermatocele with more inflammation due to previous surgery on right side. Bilateral spermatocele sacs were excised and sent for final pathology. Normal bilateral testicles and spermatic cord. Successful bilateral spermatocelectomy.   Description:  The patient was correctly identified in the preop holding area where written informed consent as well potential risk and complication reviewed. He agreed.  The patient was brought to the operating room and placed supine on the operating room table.  General anesthesia was administered. A time out was performed.  The patient was identified, the consent was reviewed and the side and site of surgery was verified.  The patient was examined and the diagnoses confirmed.   The lower abdomen and genitalia were prepped and draped in the usual sterile manner.    A 5 cm vertical incision was made along the midline raphe with a 15 blade scalpel.  We first turned our attention to the left large multi-loculated spermatocele and testicle. Electrocautery was used to dissect through the subcutaneous tissues, down to the left spermatocele sac. The left testicle was also delivered. We then examined the testicle and vas. The testicle was normal in appearance and on palpation with no masses identified. A combination of blunt dissection  and electrocautery were utilized to dissect the dartos tissue off of the spermatocele sac and completely mobilize the spermatocele.  We were able to dissect down to a small thin neck connecting the left spermatocele to the rete testes and then placed a hemostat across the base and sharply divided the spermatocele and sent this for final pathology.   The left cord was examined to ensure that all cord structures, including the vas were present.  We then closed the neck to the rete testis with a running 3-0 chromic running and locking baseball stitch. We then placed the left testicle back into the scrotum in normal anatomic position assuring no twisting of the spermatic cord. We used electrocautery to assure excellent hemostasis.   We then turned our attention to the right medium sized and less loculated spermatocele and testicle. Electrocautery was used to dissect through the subcutaneous tissues, down to the right spermatocele sac. Of note, there was somewhat more adhesions present between the testicle, spermatocele sac and dartos due to his prior surgery. The right testicle was also delivered. We then examined the testicle and vas. The testicle was normal in appearance and on palpation with no masses identified. A combination of blunt dissection and electrocautery were utilized to dissect the dartos tissue off of the spermatocele sac and completely mobilize the spermatocele.  We were able to dissect down to a small thin neck connecting the right spermatocele to the rete testes and then placed a hemostat across the base and sharply divided the spermatocele and sent this for final pathology.   The right cord was examined to ensure that all cord structures, including the vas were present.  We then closed the neck to  the rete testis with a running 3-0 chromic running and locking baseball stitch. We then placed the right testicle back into the scrotum in normal anatomic position assuring no twisting of the  spermatic cord. We used electrocautery to assure excellent hemostasis.   We then utilized 3-0 chromic in a running baseball fashion to close dartos and the subcutaneous tissue. Finally, we used 4-0 chromic to close the scrotal skin with a running vertical mattress.   The surgical area was cleaned and dried.  Dermabond was applied to the scrotal incision and once dried we placed fluffs and a scrotal support. The patient tolerated the procedure well, was awaken and transferred to PACU in stable condition.  There were no complications.  Final counts were correct.    Post Op Plan:   1. Discharge patient home when meets PACU criteria.  Attestation:  Dr. Jeffie Pollock was present and scrubbed for the entirety of the procedure.

## 2015-01-31 NOTE — Anesthesia Postprocedure Evaluation (Signed)
Anesthesia Post Note  Patient: Eric Santos  Procedure(s) Performed: Procedure(s) (LRB): SPERMATOCELECTOMY (Bilateral)  Patient location during evaluation: PACU Anesthesia Type: General Level of consciousness: awake and alert and patient cooperative Pain management: pain level controlled Vital Signs Assessment: post-procedure vital signs reviewed and stable Respiratory status: spontaneous breathing and respiratory function stable Cardiovascular status: stable Anesthetic complications: no    Last Vitals:  Filed Vitals:   01/31/15 0948 01/31/15 1000  BP: 109/81 129/98  Pulse: 77 78  Temp:    Resp: 12 14    Last Pain: There were no vitals filed for this visit.               [place]

## 2015-01-31 NOTE — Transfer of Care (Signed)
Immediate Anesthesia Transfer of Care Note  Patient: Eric Santos  Procedure(s) Performed: Procedure(s): SPERMATOCELECTOMY (Bilateral)  Patient Location: PACU  Anesthesia Type:General  Level of Consciousness: awake, alert , oriented and patient cooperative  Airway & Oxygen Therapy: Patient Spontanous Breathing and Patient connected to nasal cannula oxygen  Post-op Assessment: Report given to RN and Post -op Vital signs reviewed and stable  Post vital signs: Reviewed and stable  Last Vitals:  Filed Vitals:   01/31/15 0607  BP: 131/89  Pulse: 85  Temp: 37 C  Resp: 18    Complications: No apparent anesthesia complications

## 2015-01-31 NOTE — Discharge Instructions (Signed)

## 2015-02-01 ENCOUNTER — Encounter (HOSPITAL_BASED_OUTPATIENT_CLINIC_OR_DEPARTMENT_OTHER): Payer: Self-pay | Admitting: Urology

## 2015-03-08 ENCOUNTER — Other Ambulatory Visit (INDEPENDENT_AMBULATORY_CARE_PROVIDER_SITE_OTHER): Payer: Medicare Other

## 2015-03-08 ENCOUNTER — Encounter: Payer: Self-pay | Admitting: Internal Medicine

## 2015-03-08 ENCOUNTER — Ambulatory Visit (INDEPENDENT_AMBULATORY_CARE_PROVIDER_SITE_OTHER): Payer: Medicare Other | Admitting: Internal Medicine

## 2015-03-08 VITALS — BP 124/84 | HR 87 | Temp 98.5°F | Ht 66.0 in | Wt 272.0 lb

## 2015-03-08 DIAGNOSIS — R002 Palpitations: Secondary | ICD-10-CM

## 2015-03-08 DIAGNOSIS — E785 Hyperlipidemia, unspecified: Secondary | ICD-10-CM | POA: Diagnosis not present

## 2015-03-08 DIAGNOSIS — R7302 Impaired glucose tolerance (oral): Secondary | ICD-10-CM

## 2015-03-08 LAB — HEMOGLOBIN A1C: HEMOGLOBIN A1C: 6.2 % (ref 4.6–6.5)

## 2015-03-08 LAB — CBC WITH DIFFERENTIAL/PLATELET
BASOS PCT: 0.5 % (ref 0.0–3.0)
Basophils Absolute: 0 10*3/uL (ref 0.0–0.1)
EOS ABS: 0.3 10*3/uL (ref 0.0–0.7)
Eosinophils Relative: 3.1 % (ref 0.0–5.0)
HEMATOCRIT: 49.2 % (ref 39.0–52.0)
HEMOGLOBIN: 16.2 g/dL (ref 13.0–17.0)
LYMPHS PCT: 29.2 % (ref 12.0–46.0)
Lymphs Abs: 2.7 10*3/uL (ref 0.7–4.0)
MCHC: 32.9 g/dL (ref 30.0–36.0)
MCV: 91.4 fl (ref 78.0–100.0)
MONOS PCT: 12.1 % — AB (ref 3.0–12.0)
Monocytes Absolute: 1.1 10*3/uL — ABNORMAL HIGH (ref 0.1–1.0)
NEUTROS ABS: 5.1 10*3/uL (ref 1.4–7.7)
Neutrophils Relative %: 55.1 % (ref 43.0–77.0)
PLATELETS: 204 10*3/uL (ref 150.0–400.0)
RBC: 5.38 Mil/uL (ref 4.22–5.81)
RDW: 13.6 % (ref 11.5–15.5)
WBC: 9.2 10*3/uL (ref 4.0–10.5)

## 2015-03-08 LAB — BASIC METABOLIC PANEL
BUN: 15 mg/dL (ref 6–23)
CHLORIDE: 107 meq/L (ref 96–112)
CO2: 26 meq/L (ref 19–32)
Calcium: 9.2 mg/dL (ref 8.4–10.5)
Creatinine, Ser: 0.83 mg/dL (ref 0.40–1.50)
GFR: 97.87 mL/min (ref >60.00)
Glucose, Bld: 95 mg/dL (ref 70–99)
POTASSIUM: 4.4 meq/L (ref 3.5–5.1)
SODIUM: 141 meq/L (ref 135–145)

## 2015-03-08 LAB — TSH: TSH: 1.83 u[IU]/mL (ref 0.35–4.50)

## 2015-03-08 LAB — MAGNESIUM: MAGNESIUM: 2 mg/dL (ref 1.5–2.5)

## 2015-03-08 MED ORDER — ONETOUCH ULTRA 2 W/DEVICE KIT: PACK | Status: DC

## 2015-03-08 MED ORDER — LANCETS MISC: Status: DC

## 2015-03-08 MED ORDER — GLUCOSE BLOOD VI STRP: ORAL_STRIP | Status: DC

## 2015-03-08 NOTE — Progress Notes (Signed)
Pre visit review using our clinic review tool, if applicable. No additional management support is needed unless otherwise documented below in the visit note. 
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
## 2015-03-08 NOTE — Patient Instructions (Addendum)
Your glucometer and supplies were sent to the pharmacy  Please continue all other medications as before, and refills have been done if requested.  Please have the pharmacy call with any other refills you may need.  Please continue your efforts at being more active, low cholesterol diet, and weight control.  Please keep your appointments with your specialists as you may have planned  You will be contacted regarding the referral for: cardiac event monitor, and Cardiology  Please go to the LAB in the Basement (turn left off the elevator) for the tests to be done today  You will be contacted by phone if any changes need to be made immediately.  Otherwise, you will receive a letter about your results with an explanation, but please check with MyChart first.  Please remember to sign up for MyChart if you have not done so, as this will be important to you in the future with finding out test results, communicating by private email, and scheduling acute appointments online when needed.

## 2015-03-09 ENCOUNTER — Telehealth: Payer: Self-pay | Admitting: Internal Medicine

## 2015-03-09 MED ORDER — ONETOUCH LANCETS MISC: 1.0000 | Freq: Every day | Status: DC

## 2015-03-09 MED ORDER — GLUCOSE BLOOD VI STRP: 1.0000 | ORAL_STRIP | Freq: Every day | Status: DC

## 2015-03-09 MED ORDER — ONETOUCH VERIO W/DEVICE KIT: 1.0000 | PACK | Freq: Every day | Status: DC

## 2015-03-09 NOTE — Telephone Encounter (Signed)
Pt's wife called stating they can get the One Touch Verio Flex free through [facility] Can you please give them a call

## 2015-03-09 NOTE — Telephone Encounter (Signed)
Diabetic monitor and supplies ordered. Attempted to contact pt to advise of same, number not working (fast busy signal)

## 2015-03-11 NOTE — Assessment & Plan Note (Signed)
stable overall by history and exam, recent data reviewed with pt, and pt to continue medical treatment as before,  to f/u any worsening symptoms or concerns Lab Results  Component Value Date   HGBA1C 6.2 03/08/2015

## 2015-03-11 NOTE — Assessment & Plan Note (Addendum)
Pt not sure of taking statin, not taking now, o/w stable overall by history and exam, recent data reviewed with pt, and pt to continue medical treatment as before,  to f/u any worsening symptoms or concerna, for lower chol diet Lab Results  Component Value Date   LDLCALC 137* 01/11/2015

## 2015-03-11 NOTE — Assessment & Plan Note (Signed)
Hx suggestive of svt vs afib vs other, for cardiac event monitor, card referral,  to f/u any worsening symptoms or concerns  Note:  Total time for pt hx, exam, review of record with pt in the room, determination of diagnoses and plan for further eval and tx is > 40 min, with over 50% spent in coordination and counseling of patient

## 2015-04-11 ENCOUNTER — Encounter: Payer: Self-pay | Admitting: Cardiovascular Disease
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
## 2015-04-13 ENCOUNTER — Encounter: Payer: Self-pay | Admitting: Cardiovascular Disease

## 2015-04-13 ENCOUNTER — Ambulatory Visit (INDEPENDENT_AMBULATORY_CARE_PROVIDER_SITE_OTHER): Payer: Medicare Other | Admitting: Cardiovascular Disease

## 2015-04-13 ENCOUNTER — Observation Stay (HOSPITAL_COMMUNITY)
Admission: AD | Admit: 2015-04-13 | Discharge: 2015-04-18 | Disposition: A | Discharge status: Home / Self Care | Payer: Medicare Other | Source: Ambulatory Visit | Attending: Cardiovascular Disease | Admitting: Cardiovascular Disease

## 2015-04-13 ENCOUNTER — Encounter (HOSPITAL_COMMUNITY): Payer: Self-pay | Admitting: General Practice

## 2015-04-13 VITALS — BP 140/90 | HR 158 | Ht 66.0 in | Wt 268.4 lb

## 2015-04-13 DIAGNOSIS — K219 Gastro-esophageal reflux disease without esophagitis: Secondary | ICD-10-CM | POA: Diagnosis not present

## 2015-04-13 DIAGNOSIS — E785 Hyperlipidemia, unspecified: Secondary | ICD-10-CM | POA: Insufficient documentation

## 2015-04-13 DIAGNOSIS — Z792 Long term (current) use of antibiotics: Secondary | ICD-10-CM | POA: Diagnosis not present

## 2015-04-13 DIAGNOSIS — I4892 Unspecified atrial flutter: Secondary | ICD-10-CM | POA: Diagnosis not present

## 2015-04-13 DIAGNOSIS — R002 Palpitations: Secondary | ICD-10-CM | POA: Diagnosis not present

## 2015-04-13 DIAGNOSIS — Z791 Long term (current) use of non-steroidal anti-inflammatories (NSAID): Secondary | ICD-10-CM | POA: Diagnosis not present

## 2015-04-13 DIAGNOSIS — Z6841 Body Mass Index (BMI) 40.0 and over, adult: Secondary | ICD-10-CM | POA: Insufficient documentation

## 2015-04-13 DIAGNOSIS — M1990 Unspecified osteoarthritis, unspecified site: Secondary | ICD-10-CM | POA: Insufficient documentation

## 2015-04-13 DIAGNOSIS — G4730 Sleep apnea, unspecified: Secondary | ICD-10-CM | POA: Diagnosis not present

## 2015-04-13 DIAGNOSIS — R7302 Impaired glucose tolerance (oral): Secondary | ICD-10-CM | POA: Diagnosis not present

## 2015-04-13 DIAGNOSIS — Z7951 Long term (current) use of inhaled steroids: Secondary | ICD-10-CM | POA: Diagnosis not present

## 2015-04-13 DIAGNOSIS — I483 Typical atrial flutter: Secondary | ICD-10-CM

## 2015-04-13 LAB — CBC WITH DIFFERENTIAL/PLATELET
Basophils Absolute: 0 10*3/uL (ref 0.0–0.1)
Basophils Relative: 0 %
EOS PCT: 4 %
Eosinophils Absolute: 0.3 10*3/uL (ref 0.0–0.7)
HCT: 48.6 % (ref 39.0–52.0)
Hemoglobin: 16.5 g/dL (ref 13.0–17.0)
LYMPHS ABS: 2.4 10*3/uL (ref 0.7–4.0)
LYMPHS PCT: 32 %
MCH: 30.6 pg (ref 26.0–34.0)
MCHC: 34 g/dL (ref 30.0–36.0)
MCV: 90.2 fL (ref 78.0–100.0)
MONO ABS: 1.1 10*3/uL — AB (ref 0.1–1.0)
MONOS PCT: 14 %
Neutro Abs: 3.8 10*3/uL (ref 1.7–7.7)
Neutrophils Relative %: 50 %
PLATELETS: 181 10*3/uL (ref 150–400)
RBC: 5.39 MIL/uL (ref 4.22–5.81)
RDW: 13.3 % (ref 11.5–15.5)
WBC: 7.7 10*3/uL (ref 4.0–10.5)

## 2015-04-13 LAB — COMPREHENSIVE METABOLIC PANEL
ALBUMIN: 3.6 g/dL (ref 3.5–5.0)
ALT: 18 U/L (ref 17–63)
AST: 19 U/L (ref 15–41)
Alkaline Phosphatase: 49 U/L (ref 38–126)
Anion gap: 7 (ref 5–15)
BUN: 16 mg/dL (ref 6–20)
CO2: 25 mmol/L (ref 22–32)
Calcium: 9 mg/dL (ref 8.9–10.3)
Chloride: 110 mmol/L (ref 101–111)
Creatinine, Ser: 0.75 mg/dL (ref 0.61–1.24)
GFR calc Af Amer: >60 mL/min (ref >60)
GFR calc non Af Amer: >60 mL/min (ref >60)
GLUCOSE: 73 mg/dL (ref 65–99)
POTASSIUM: 4.6 mmol/L (ref 3.5–5.1)
Sodium: 142 mmol/L (ref 135–145)
TOTAL PROTEIN: 6 g/dL — AB (ref 6.5–8.1)
Total Bilirubin: 0.7 mg/dL (ref 0.3–1.2)

## 2015-04-13 LAB — GLUCOSE, CAPILLARY
GLUCOSE-CAPILLARY: 97 mg/dL (ref 65–99)
Glucose-Capillary: 135 mg/dL — ABNORMAL HIGH (ref 65–99)

## 2015-04-13 LAB — PROTIME-INR
INR: 1.14 (ref 0.00–1.49)
Prothrombin Time: 14.8 seconds (ref 11.6–15.2)

## 2015-04-13 LAB — TSH: TSH: 1.397 u[IU]/mL (ref 0.350–4.500)

## 2015-04-13 MED ORDER — ONDANSETRON HCL 4 MG/2ML IJ SOLN: 4.0000 mg | Freq: Four times a day (QID) | INTRAMUSCULAR | Status: DC | PRN

## 2015-04-13 MED ORDER — NAPROXEN SOD-DIPHENHYDRAMINE 220-25 MG PO TABS: 1.0000 | ORAL_TABLET | Freq: Every evening | ORAL | Status: DC | PRN

## 2015-04-13 MED ORDER — DOXYCYCLINE MONOHYDRATE 100 MG PO CAPS: 100.0000 mg | ORAL_CAPSULE | Freq: Two times a day (BID) | ORAL | Status: DC

## 2015-04-13 MED ORDER — INSULIN ASPART 100 UNIT/ML ~~LOC~~ SOLN: 0.0000 [IU] | Freq: Every day | SUBCUTANEOUS | Status: DC

## 2015-04-13 MED ORDER — NITROGLYCERIN 0.4 MG SL SUBL: 0.4000 mg | SUBLINGUAL_TABLET | SUBLINGUAL | Status: DC | PRN

## 2015-04-13 MED ORDER — CENTRUM SILVER ADULT 50+ PO TABS: 1.0000 | ORAL_TABLET | Freq: Every morning | ORAL | Status: DC

## 2015-04-13 MED ORDER — ROSUVASTATIN CALCIUM 40 MG PO TABS
20.0000 mg | ORAL_TABLET | Freq: Every day | ORAL | Status: DC
  Administered 2015-04-13 – 2015-04-17 (×5): 20 mg via ORAL
  Filled 2015-04-13 (×2): qty 1
  Filled 2015-04-13: qty 0.5
  Filled 2015-04-13 (×2): qty 1
  Filled 2015-04-13: qty 0.5

## 2015-04-13 MED ORDER — INSULIN ASPART 100 UNIT/ML ~~LOC~~ SOLN
0.0000 [IU] | Freq: Three times a day (TID) | SUBCUTANEOUS | Status: DC
  Administered 2015-04-13 – 2015-04-16 (×3): 2 [IU] via SUBCUTANEOUS

## 2015-04-13 MED ORDER — LORATADINE 10 MG PO TABS: 10.0000 mg | ORAL_TABLET | Freq: Every day | ORAL | Status: DC | PRN

## 2015-04-13 MED ORDER — APIXABAN 5 MG PO TABS
5.0000 mg | ORAL_TABLET | Freq: Two times a day (BID) | ORAL | Status: DC
  Administered 2015-04-13 – 2015-04-18 (×11): 5 mg via ORAL
  Filled 2015-04-13 (×11): qty 1

## 2015-04-13 MED ORDER — METOPROLOL TARTRATE 25 MG PO TABS
25.0000 mg | ORAL_TABLET | Freq: Four times a day (QID) | ORAL | Status: DC
  Administered 2015-04-13 – 2015-04-15 (×8): 25 mg via ORAL
  Filled 2015-04-13 (×9): qty 1

## 2015-04-13 MED ORDER — ACETAMINOPHEN 325 MG PO TABS: 650.0000 mg | ORAL_TABLET | ORAL | Status: DC | PRN

## 2015-04-13 MED ORDER — TIZANIDINE HCL 4 MG PO TABS
4.0000 mg | ORAL_TABLET | Freq: Four times a day (QID) | ORAL | Status: DC | PRN
  Filled 2015-04-13: qty 1

## 2015-04-13 MED ORDER — ZOLPIDEM TARTRATE 5 MG PO TABS: 5.0000 mg | ORAL_TABLET | Freq: Every evening | ORAL | Status: DC | PRN

## 2015-04-13 MED ORDER — DOXYCYCLINE HYCLATE 100 MG PO TABS
100.0000 mg | ORAL_TABLET | Freq: Two times a day (BID) | ORAL | Status: DC
  Administered 2015-04-13 – 2015-04-18 (×10): 100 mg via ORAL
  Filled 2015-04-13 (×10): qty 1

## 2015-04-13 MED ORDER — FLUTICASONE PROPIONATE 50 MCG/ACT NA SUSP
2.0000 | Freq: Every day | NASAL | Status: DC
  Administered 2015-04-14 – 2015-04-18 (×5): 2 via NASAL
  Filled 2015-04-13: qty 16

## 2015-04-13 MED ORDER — ADULT MULTIVITAMIN W/MINERALS CH
1.0000 | ORAL_TABLET | Freq: Every day | ORAL | Status: DC
  Administered 2015-04-13 – 2015-04-18 (×6): 1 via ORAL
  Filled 2015-04-13 (×6): qty 1

## 2015-04-13 MED ORDER — ALPRAZOLAM 0.25 MG PO TABS: 0.2500 mg | ORAL_TABLET | Freq: Two times a day (BID) | ORAL | Status: DC | PRN
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
## 2015-04-13 NOTE — Progress Notes (Signed)
Call from telemetry - rate frequent exceeding 140's - confirmed patient is a direct admit from Dr. Kyla Balzarine office in A Fib w/ rate 158 and that this RN has notified the card master - waiting for orders. Telemetry resetting rate to 140 d/t frequency of repeat alarms.

## 2015-04-13 NOTE — Patient Instructions (Signed)
Sent Patient to hospital for admission.

## 2015-04-13 NOTE — Progress Notes (Signed)
Received report from [place], RN; will manage care thru 1900 shift change.

## 2015-04-13 NOTE — Progress Notes (Signed)
Call from telemetry - noted just recently rate has dropped to 70-90's, occasionally tach 110-120's and continues A Fib / A Flutter. This RN confirmed he is not on a drip, oral meds adjusted at admission. Stated she has reset parameters back to 120.

## 2015-04-13 NOTE — Progress Notes (Signed)
Notified Card Master of patient arrival. Returned call and confirmed will notify PA for orders.

## 2015-04-14 ENCOUNTER — Inpatient Hospital Stay (HOSPITAL_BASED_OUTPATIENT_CLINIC_OR_DEPARTMENT_OTHER): Payer: Medicare Other

## 2015-04-14 DIAGNOSIS — I4892 Unspecified atrial flutter: Secondary | ICD-10-CM

## 2015-04-14 DIAGNOSIS — E785 Hyperlipidemia, unspecified: Secondary | ICD-10-CM | POA: Diagnosis not present

## 2015-04-14 DIAGNOSIS — G4730 Sleep apnea, unspecified: Secondary | ICD-10-CM | POA: Diagnosis not present

## 2015-04-14 DIAGNOSIS — I483 Typical atrial flutter: Secondary | ICD-10-CM | POA: Diagnosis not present

## 2015-04-14 DIAGNOSIS — R7302 Impaired glucose tolerance (oral): Secondary | ICD-10-CM | POA: Diagnosis not present

## 2015-04-14 LAB — GLUCOSE, CAPILLARY
GLUCOSE-CAPILLARY: 96 mg/dL (ref 65–99)
GLUCOSE-CAPILLARY: 88 mg/dL (ref 65–99)
Glucose-Capillary: 115 mg/dL — ABNORMAL HIGH (ref 65–99)
Glucose-Capillary: 98 mg/dL (ref 65–99)

## 2015-04-14 LAB — HEMOGLOBIN A1C
Hgb A1c MFr Bld: 6.1 % — ABNORMAL HIGH (ref 4.8–5.6)
Mean Plasma Glucose: 128 mg/dL

## 2015-04-14 NOTE — Care Management Note (Signed)
Case Management Note Marvetta Gibbons RN, BSN Unit 2W-Case Manager [phone]  Patient Details  Name: Eric Santos MRN: [id number] Date of Birth: Oct 01, 1945  Subjective/Objective:  Pt admitted with afib- ?cardioversion on Monday 2/13                  Action/Plan: PTA pt lived at home- noted pt started on Eliquis- insurance check completed- Per rep at [employer] --Eliquis: $33.60 at [place] is required [phone]-- 30 day free card given to pt. - CM to continue to follow for further needs.   Expected Discharge Date:                  Expected Discharge Plan:  Home/Self Care  In-House Referral:     Discharge planning Services  CM Consult, Medication Assistance  Post Acute Care Choice:    Choice offered to:     DME Arranged:    DME Agency:     HH Arranged:    HH Agency:     Status of Service:  In process, will continue to follow  Medicare Important Message Given:    Date Medicare IM Given:    Medicare IM give by:    Date Additional Medicare IM Given:    Additional Medicare Important Message give by:     If discussed at [place] of Stay Meetings, dates discussed:    Additional Comments:  Dawayne Patricia, RN 04/14/2015, 3:09 PM

## 2015-04-14 NOTE — Progress Notes (Signed)
This admission has been reviewed and determined not to meet inpatient level of care. Both attending Physician and Medical Director are in agreement this should be an Observation encounter according to the Medicare Conditions of Participation as set forth in CFR 42 Chapter 456 482.12 (c) and the Medicare Condition Code-44 Regulations CFR 42 Chapter 100 - 04 50.3. The Patient and/or Patient Representative was notified via delivery of the "MEDICARE OBSERVATION STATUS NOTIFICATION".              

## 2015-04-14 NOTE — Progress Notes (Signed)
  Echocardiogram 2D Echocardiogram has been performed.  Eric Santos 04/14/2015, 1:59 PM

## 2015-04-14 NOTE — Progress Notes (Signed)
Utilization review completed.  

## 2015-04-14 NOTE — Progress Notes (Signed)
Patient Profile: 70 y/o male admitted from the office for symptomatic atrial flutter w/ RVR.  Subjective: Symptoms have improved now that rate is improved. Currently w/o palpitations. No dyspnea or CP at this time. He denies any recent viral illnesses/URI symptoms. No recent n/v/d.   Objective: Vital signs in last 24 hours: Temp:  [97.5 F (36.4 C)-98.2 F (36.8 C)] 97.8 F (36.6 C) (02/10 0455) Pulse Rate:  [78-158] 81 (02/10 0455) Resp:  [17-18] 18 (02/10 0455) BP: (117-140)/(87-100) 121/87 mmHg (02/10 0455) SpO2:  [95 %-96 %] 95 % (02/10 0455) Weight:  [265 lb 4.8 oz (120.339 kg)-268 lb 6.4 oz (121.745 kg)] 265 lb 4.8 oz (120.339 kg) (02/10 0455) Last BM Date: 04/13/15  Intake/Output from previous day: 02/09 0701 - 02/10 0700 In: 720 [P.O.:720] Out: -  Intake/Output this shift: Total I/O In: 240 [P.O.:240] Out: -   Medications Current Facility-Administered Medications  Medication Dose Route Frequency Provider Last Rate Last Dose  . acetaminophen (TYLENOL) tablet 650 mg  650 mg Oral Q4H PRN Rhonda G Barrett, PA-C      . ALPRAZolam Duanne Moron) tablet 0.25 mg  0.25 mg Oral BID PRN Evelene Croon Barrett, PA-C      . apixaban (ELIQUIS) tablet 5 mg  5 mg Oral BID Theone Murdoch Hammons, RPH   5 mg at 04/13/15 2146  . doxycycline (VIBRA-TABS) tablet 100 mg  100 mg Oral BID Josue Hector, MD   100 mg at 04/13/15 2024  . fluticasone (FLONASE) 50 MCG/ACT nasal spray 2 spray  2 spray Each Nare Daily Rhonda G Barrett, PA-C   2 spray at 04/13/15 1333  . insulin aspart (novoLOG) injection 0-15 Units  0-15 Units Subcutaneous TID WC Evelene Croon Barrett, PA-C   2 Units at 04/13/15 1855  . insulin aspart (novoLOG) injection 0-5 Units  0-5 Units Subcutaneous QHS Evelene Croon Barrett, PA-C   0 Units at 04/13/15 2126  . loratadine (CLARITIN) tablet 10 mg  10 mg Oral Daily PRN Rhonda G Barrett, PA-C      . metoprolol tartrate (LOPRESSOR) tablet 25 mg  25 mg Oral 4 times per day Lonn Georgia, PA-C   25 mg  at 04/14/15 0034  . multivitamin with minerals tablet 1 tablet  1 tablet Oral Daily Josue Hector, MD   1 tablet at 04/13/15 1332  . nitroGLYCERIN (NITROSTAT) SL tablet 0.4 mg  0.4 mg Sublingual Q5 Min x 3 PRN Rhonda G Barrett, PA-C      . ondansetron (ZOFRAN) injection 4 mg  4 mg Intravenous Q6H PRN Rhonda G Barrett, PA-C      . rosuvastatin (CRESTOR) tablet 20 mg  20 mg Oral q1800 Rhonda G Barrett, PA-C   20 mg at 04/13/15 1852  . tiZANidine (ZANAFLEX) tablet 4 mg  4 mg Oral Q6H PRN Rhonda G Barrett, PA-C      . zolpidem (AMBIEN) tablet 5 mg  5 mg Oral QHS PRN Lonn Georgia, PA-C        PE: General appearance: alert, cooperative, no distress and moderately obese Neck: no carotid bruit and no JVD Lungs: clear to auscultation bilaterally Heart: irregular ryththm, regular rate Extremities: no LEE Pulses: 2+ and symmetric Skin: warm and dry Neurologic: Grossly normal  Lab Results:   Recent Labs  04/13/15 1147  WBC 7.7  HGB 16.5  HCT 48.6  PLT 181   BMET  Recent Labs  04/13/15 1147  NA 142  K 4.6  CL 110  CO2  25  GLUCOSE 73  BUN 16  CREATININE 0.75  CALCIUM 9.0   PT/INR  Recent Labs  04/13/15 1147  LABPROT 14.8  INR 1.14   Cardiac Panel (last 3 results) No results for input(s): CKTOTAL, CKMB, TROPONINI, RELINDX in the last 72 hours.  Studies/Results: 2D echo pending   Assessment/Plan  Active Problems:   Atrial flutter ([place])  1. Atrial Flutter w/ RVR: New diagnosis. Patient admitted and started on 25 mg of Metoprolol q6H + Eliquis for a/c with plans for TEE/DCCV on Monday. His rate is improved with metoprolol, currently in the 90s. Symptoms have resolved. No s/s of acute CHF. 2D echo pending.  TSH normal. K is WNL. CBC unremarkable w/o anemia or leukocytosis. Afebrile. Renal function is normal. BP is stable. Continue on telemetry to monitor rate. TEE/DCCV on Monday if no spontaneous conversion.    LOS: 1 day    Brittainy M. Ladoris Gene 04/14/2015 6:32 AM  Patient seen and examined and history reviewed. Agree with above findings and plan. 70 yo WM admitted with atrial flutter with RVR. Symptomatic. Now feels much better with rate control on beta blocker. Eliquis initiated. [place] VASC score of 2. Other labs ok. Plan for TEE guided DCCV on Monday. Questions answered. It appears that he has flutter and may be a candidate for ablation if it recurs.  Milus Fritze [place], [place] 04/14/2015 9:44 AM

## 2015-04-14 NOTE — Progress Notes (Signed)
Noted pt started in Eliquis- insurance check completed- Per rep at [place]: $33.60 at [employer], Josem Kaufmann is required [phone]

## 2015-04-15 ENCOUNTER — Encounter (HOSPITAL_COMMUNITY): Payer: Self-pay | Admitting: Cardiology

## 2015-04-15 DIAGNOSIS — E785 Hyperlipidemia, unspecified: Secondary | ICD-10-CM | POA: Diagnosis not present

## 2015-04-15 DIAGNOSIS — I483 Typical atrial flutter: Secondary | ICD-10-CM | POA: Diagnosis not present

## 2015-04-15 DIAGNOSIS — R7302 Impaired glucose tolerance (oral): Secondary | ICD-10-CM | POA: Diagnosis not present

## 2015-04-15 DIAGNOSIS — G4730 Sleep apnea, unspecified: Secondary | ICD-10-CM | POA: Diagnosis not present

## 2015-04-15 DIAGNOSIS — I4892 Unspecified atrial flutter: Secondary | ICD-10-CM | POA: Diagnosis not present

## 2015-04-15 LAB — GLUCOSE, CAPILLARY
GLUCOSE-CAPILLARY: 97 mg/dL (ref 65–99)
Glucose-Capillary: 91 mg/dL (ref 65–99)
Glucose-Capillary: 97 mg/dL (ref 65–99)
Glucose-Capillary: 97 mg/dL (ref 65–99)

## 2015-04-15 MED ORDER — METOPROLOL TARTRATE 50 MG PO TABS
50.0000 mg | ORAL_TABLET | Freq: Two times a day (BID) | ORAL | Status: DC
  Administered 2015-04-15 – 2015-04-18 (×6): 50 mg via ORAL
  Filled 2015-04-15 (×6): qty 1

## 2015-04-15 MED ORDER — METOPROLOL TARTRATE 25 MG PO TABS
25.0000 mg | ORAL_TABLET | Freq: Once | ORAL | Status: AC
  Administered 2015-04-15: 25 mg via ORAL
  Filled 2015-04-15: qty 1

## 2015-04-15 NOTE — Progress Notes (Signed)
Patient Profile: 70 y/o male admitted from the office for symptomatic atrial flutter w/ RVR.  Subjective: Denies CP; describes occasional DOE and palpitations  Objective: Vital signs in last 24 hours: Temp:  [97.5 F (36.4 C)-98 F (36.7 C)] 97.8 F (36.6 C) (02/11 0442) Pulse Rate:  [71-95] 95 (02/11 0442) Resp:  [18-20] 18 (02/11 0442) BP: (110-134)/(72-98) 110/74 mmHg (02/11 0442) SpO2:  [97 %] 97 % (02/11 0442) Weight:  [262 lb 6.4 oz (119.024 kg)] 262 lb 6.4 oz (119.024 kg) (02/11 0442) Last BM Date: 04/15/15  Intake/Output from previous day: 02/10 0701 - 02/11 0700 In: 17 [P.O.:820] Out: -  Intake/Output this shift: Total I/O In: 240 [P.O.:240] Out: 380 [Urine:380]  Medications Current Facility-Administered Medications  Medication Dose Route Frequency Provider Last Rate Last Dose  . acetaminophen (TYLENOL) tablet 650 mg  650 mg Oral Q4H PRN Rhonda G Barrett, PA-C      . ALPRAZolam Duanne Moron) tablet 0.25 mg  0.25 mg Oral BID PRN Evelene Croon Barrett, PA-C      . apixaban (ELIQUIS) tablet 5 mg  5 mg Oral BID Kimberly B Hammons, RPH   5 mg at 04/15/15 1046  . doxycycline (VIBRA-TABS) tablet 100 mg  100 mg Oral BID Josue Hector, MD   100 mg at 04/15/15 0910  . fluticasone (FLONASE) 50 MCG/ACT nasal spray 2 spray  2 spray Each Nare Daily Rhonda G Barrett, PA-C   2 spray at 04/15/15 1046  . insulin aspart (novoLOG) injection 0-15 Units  0-15 Units Subcutaneous TID WC Evelene Croon Barrett, PA-C   2 Units at 04/13/15 1855  . insulin aspart (novoLOG) injection 0-5 Units  0-5 Units Subcutaneous QHS Evelene Croon Barrett, PA-C   0 Units at 04/13/15 2126  . loratadine (CLARITIN) tablet 10 mg  10 mg Oral Daily PRN Rhonda G Barrett, PA-C      . metoprolol tartrate (LOPRESSOR) tablet 25 mg  25 mg Oral 4 times per day Lonn Georgia, PA-C   25 mg at 04/15/15 0603  . multivitamin with minerals tablet 1 tablet  1 tablet Oral Daily Josue Hector, MD   1 tablet at 04/15/15 1046  .  nitroGLYCERIN (NITROSTAT) SL tablet 0.4 mg  0.4 mg Sublingual Q5 Min x 3 PRN Rhonda G Barrett, PA-C      . ondansetron (ZOFRAN) injection 4 mg  4 mg Intravenous Q6H PRN Rhonda G Barrett, PA-C      . rosuvastatin (CRESTOR) tablet 20 mg  20 mg Oral q1800 Rhonda G Barrett, PA-C   20 mg at 04/14/15 1809  . tiZANidine (ZANAFLEX) tablet 4 mg  4 mg Oral Q6H PRN Rhonda G Barrett, PA-C      . zolpidem (AMBIEN) tablet 5 mg  5 mg Oral QHS PRN Lonn Georgia, PA-C        PE: General appearance: alert, cooperative, no distress and moderately obese Neck: supple Lungs: CTA Heart: irregular ryththm Abd: soft, not tender or distended Extremities: no LEE Skin: warm and dry Neurologic: Grossly intact  Lab Results:   Recent Labs  04/13/15 1147  WBC 7.7  HGB 16.5  HCT 48.6  PLT 181   BMET  Recent Labs  04/13/15 1147  NA 142  K 4.6  CL 110  CO2 25  GLUCOSE 73  BUN 16  CREATININE 0.75  CALCIUM 9.0   PT/INR  Recent Labs  04/13/15 1147  LABPROT 14.8  INR 1.14      Assessment/Plan  Principal  Problem:   Atrial flutter (H[facility]) Active Problems:   Impaired glucose tolerance  1. Atrial Flutter w/ RVR: Patient remains in atrial flutter. His heart rate is much improved. Change metoprolol to 50 mg twice a day. Continue apixaban. Echocardiogram shows ejection fraction 50-55%. Plan TEE guided cardioversion on Monday. Note patient apparently has a history of atrial fibrillation based on notes. Therefore would not pursue ablation of atrial flutter. CHADSvasc 2 for age and diet controlled DM; will need long term anticoagulation. 2 hyperlipidemia-continue statin.   LOS: 2 days   Kirk Ruths, [place] 04/15/2015 11:57 AM

## 2015-04-16 DIAGNOSIS — I483 Typical atrial flutter: Secondary | ICD-10-CM | POA: Diagnosis not present

## 2015-04-16 DIAGNOSIS — I4892 Unspecified atrial flutter: Secondary | ICD-10-CM | POA: Diagnosis not present

## 2015-04-16 LAB — GLUCOSE, CAPILLARY
GLUCOSE-CAPILLARY: 126 mg/dL — AB (ref 65–99)
GLUCOSE-CAPILLARY: 94 mg/dL (ref 65–99)
GLUCOSE-CAPILLARY: 123 mg/dL — AB (ref 65–99)
Glucose-Capillary: 90 mg/dL (ref 65–99)

## 2015-04-16 NOTE — Progress Notes (Signed)
Patient Profile: 70 y/o male admitted from the office for symptomatic atrial flutter w/ RVR.  Subjective: Mild chest tightness with elevated HR; positive DOE; no palpitations  Objective: Vital signs in last 24 hours: Temp:  [97.4 F (36.3 C)-97.9 F (36.6 C)] 97.4 F (36.3 C) (02/12 0451) Pulse Rate:  [51-102] 51 (02/12 0451) Resp:  [18] 18 (02/12 0451) BP: (105-125)/(73-80) 109/77 mmHg (02/12 0451) SpO2:  [93 %-98 %] 94 % (02/12 0451) Weight:  [261 lb 6.4 oz (118.57 kg)] 261 lb 6.4 oz (118.57 kg) (02/12 0451) Last BM Date: 04/15/15  Intake/Output from previous day: 02/11 0701 - 02/12 0700 In: 720 [P.O.:720] Out: 2630 [Urine:2630] Intake/Output this shift:    Medications Current Facility-Administered Medications  Medication Dose Route Frequency Provider Last Rate Last Dose  . acetaminophen (TYLENOL) tablet 650 mg  650 mg Oral Q4H PRN Rhonda G Barrett, PA-C      . ALPRAZolam Duanne Moron) tablet 0.25 mg  0.25 mg Oral BID PRN Evelene Croon Barrett, PA-C      . apixaban (ELIQUIS) tablet 5 mg  5 mg Oral BID Theone Murdoch Hammons, RPH   5 mg at 04/15/15 2140  . doxycycline (VIBRA-TABS) tablet 100 mg  100 mg Oral BID Josue Hector, MD   100 mg at 04/16/15 0827  . fluticasone (FLONASE) 50 MCG/ACT nasal spray 2 spray  2 spray Each Nare Daily Rhonda G Barrett, PA-C   2 spray at 04/15/15 1046  . insulin aspart (novoLOG) injection 0-15 Units  0-15 Units Subcutaneous TID WC Evelene Croon Barrett, PA-C   2 Units at 04/16/15 0640  . insulin aspart (novoLOG) injection 0-5 Units  0-5 Units Subcutaneous QHS Evelene Croon Barrett, PA-C   0 Units at 04/13/15 2126  . loratadine (CLARITIN) tablet 10 mg  10 mg Oral Daily PRN Rhonda G Barrett, PA-C      . metoprolol (LOPRESSOR) tablet 50 mg  50 mg Oral BID Lelon Perla, MD   50 mg at 04/15/15 2140  . multivitamin with minerals tablet 1 tablet  1 tablet Oral Daily Josue Hector, MD   1 tablet at 04/15/15 1046  . nitroGLYCERIN (NITROSTAT) SL tablet 0.4 mg  0.4 mg  Sublingual Q5 Min x 3 PRN Rhonda G Barrett, PA-C      . ondansetron (ZOFRAN) injection 4 mg  4 mg Intravenous Q6H PRN Rhonda G Barrett, PA-C      . rosuvastatin (CRESTOR) tablet 20 mg  20 mg Oral q1800 Rhonda G Barrett, PA-C   20 mg at 04/15/15 1949  . tiZANidine (ZANAFLEX) tablet 4 mg  4 mg Oral Q6H PRN Rhonda G Barrett, PA-C      . zolpidem (AMBIEN) tablet 5 mg  5 mg Oral QHS PRN Lonn Georgia, PA-C        PE: General appearance: alert, cooperative, no distress and moderately obese Neck: supple Lungs: CTA Heart: irregular ryththm Abd: soft, not tender or distended Extremities: no LEE Skin: warm and dry Neurologic: Grossly intact  Lab Results:   Recent Labs  04/13/15 1147  WBC 7.7  HGB 16.5  HCT 48.6  PLT 181   BMET  Recent Labs  04/13/15 1147  NA 142  K 4.6  CL 110  CO2 25  GLUCOSE 73  BUN 16  CREATININE 0.75  CALCIUM 9.0   PT/INR  Recent Labs  04/13/15 1147  LABPROT 14.8  INR 1.14      Assessment/Plan  Principal Problem:   Atrial flutter (HCC)  Active Problems:   Impaired glucose tolerance  1. Atrial Flutter w/ RVR: Patient remains in atrial flutter. His heart rate is high normal. Continue metoprolol 50 mg twice a day. Continue apixaban. Echocardiogram shows ejection fraction 50-55%. Plan TEE guided cardioversion on Monday. Note patient apparently has a history of atrial fibrillation based on notes. Therefore would not pursue ablation of atrial flutter. CHADSvasc 2 for age and diet controlled DM; will need long term anticoagulation. 2 hyperlipidemia-continue statin.   LOS: 3 days   Kirk Ruths, [place] 04/16/2015 9:41 AM

## 2015-04-16 NOTE — Progress Notes (Signed)
ANTICOAGULATION CONSULT NOTE - Follow Up Consult  Pharmacy Consult for Eliquis Indication: atrial fibrillation  Allergies  Allergen Reactions  . Adhesive [Tape] Other (See Comments)    "peels skin off"    Patient Measurements: Height: 5\' 6"  (167.6 cm) Weight: 261 lb 6.4 oz (118.57 kg) IBW/kg (Calculated) : 63.8   Vital Signs: Temp: 97.4 F (36.3 C) (02/12 0451) Temp Source: Oral (02/12 0451) BP: 106/79 mmHg (02/12 1242) Pulse Rate: 62 (02/12 1242)  Labs: No results for input(s): HGB, HCT, PLT, APTT, LABPROT, INR, HEPARINUNFRC, HEPRLOWMOCWT, CREATININE, CKTOTAL, CKMB, TROPONINI in the last 72 hours.  Estimated Creatinine Clearance: 107.1 mL/min (by C-G formula based on Cr of 0.75).   Assessment: 70 yo M directly admitted from Cards MD office with rapid aflutter. Plan for patient to start apixaban, metoprolol, and TEE/DCCV Monday (if needed).  PMH: glucose intolerance  Anticoagulation: Apixaban for new aflutter. CBC WNL. CHADsVASC score of 2. CrCl >100  Goal of Therapy:  Therapeutic oral anticoagulation Monitor platelets by anticoagulation protocol: Yes   Plan:  Apixaban 5mg  PO BID   Eric Santos, PharmD, BCPS Clinical Staff Pharmacist Pager [phone]  Eric Santos 04/16/2015,1:53 PM

## 2015-04-17 ENCOUNTER — Encounter (HOSPITAL_COMMUNITY): Payer: Self-pay | Admitting: Certified Registered Nurse Anesthetist

## 2015-04-17 ENCOUNTER — Observation Stay (HOSPITAL_COMMUNITY): Payer: Medicare Other | Admitting: Anesthesiology

## 2015-04-17 ENCOUNTER — Encounter (HOSPITAL_COMMUNITY)
Admission: AD | Disposition: A | Discharge status: Home / Self Care | Payer: Self-pay | Source: Ambulatory Visit | Attending: Cardiovascular Disease

## 2015-04-17 ENCOUNTER — Observation Stay (HOSPITAL_BASED_OUTPATIENT_CLINIC_OR_DEPARTMENT_OTHER): Payer: Medicare Other

## 2015-04-17 DIAGNOSIS — R7302 Impaired glucose tolerance (oral): Secondary | ICD-10-CM | POA: Diagnosis not present

## 2015-04-17 DIAGNOSIS — I483 Typical atrial flutter: Secondary | ICD-10-CM | POA: Diagnosis not present

## 2015-04-17 DIAGNOSIS — I4892 Unspecified atrial flutter: Secondary | ICD-10-CM

## 2015-04-17 DIAGNOSIS — G4730 Sleep apnea, unspecified: Secondary | ICD-10-CM | POA: Diagnosis not present

## 2015-04-17 DIAGNOSIS — E785 Hyperlipidemia, unspecified: Secondary | ICD-10-CM | POA: Diagnosis not present

## 2015-04-17 HISTORY — PX: TEE WITHOUT CARDIOVERSION: SHX5443

## 2015-04-17 HISTORY — PX: CARDIOVERSION: SHX1299

## 2015-04-17 LAB — GLUCOSE, CAPILLARY
GLUCOSE-CAPILLARY: 97 mg/dL (ref 65–99)
GLUCOSE-CAPILLARY: 90 mg/dL (ref 65–99)
GLUCOSE-CAPILLARY: 98 mg/dL (ref 65–99)
Glucose-Capillary: 116 mg/dL — ABNORMAL HIGH (ref 65–99)

## 2015-04-17 SURGERY — CARDIOVERSION
Anesthesia: Monitor Anesthesia Care

## 2015-04-17 MED ORDER — LIDOCAINE VISCOUS 2 % MT SOLN
OROMUCOSAL | Status: AC
  Filled 2015-04-17: qty 15

## 2015-04-17 MED ORDER — EPHEDRINE SULFATE 50 MG/ML IJ SOLN
INTRAMUSCULAR | Status: DC | PRN
  Administered 2015-04-17: 10 mg via INTRAVENOUS
  Administered 2015-04-17: 15 mg via INTRAVENOUS

## 2015-04-17 MED ORDER — SODIUM CHLORIDE 0.9 % IV SOLN
INTRAVENOUS | Status: DC | PRN
  Administered 2015-04-17: 14:00:00 via INTRAVENOUS

## 2015-04-17 MED ORDER — PROPOFOL 500 MG/50ML IV EMUL
INTRAVENOUS | Status: DC | PRN
  Administered 2015-04-17: 100 ug/kg/min via INTRAVENOUS

## 2015-04-17 MED ORDER — ONDANSETRON HCL 4 MG/2ML IJ SOLN
INTRAMUSCULAR | Status: DC | PRN
  Administered 2015-04-17: 4 mg via INTRAVENOUS

## 2015-04-17 NOTE — H&P (View-Only) (Signed)
Patient Profile: 70 y/o male admitted from the office for symptomatic atrial flutter w/ RVR.  Subjective: Mild chest tightness with elevated HR; positive DOE; no palpitations  Objective: Vital signs in last 24 hours: Temp:  [97.4 F (36.3 C)-97.9 F (36.6 C)] 97.4 F (36.3 C) (02/12 0451) Pulse Rate:  [51-102] 51 (02/12 0451) Resp:  [18] 18 (02/12 0451) BP: (105-125)/(73-80) 109/77 mmHg (02/12 0451) SpO2:  [93 %-98 %] 94 % (02/12 0451) Weight:  [261 lb 6.4 oz (118.57 kg)] 261 lb 6.4 oz (118.57 kg) (02/12 0451) Last BM Date: 04/15/15  Intake/Output from previous day: 02/11 0701 - 02/12 0700 In: 720 [P.O.:720] Out: 2630 [Urine:2630] Intake/Output this shift:    Medications Current Facility-Administered Medications  Medication Dose Route Frequency Provider Last Rate Last Dose  . acetaminophen (TYLENOL) tablet 650 mg  650 mg Oral Q4H PRN Rhonda G Barrett, PA-C      . ALPRAZolam Duanne Moron) tablet 0.25 mg  0.25 mg Oral BID PRN Evelene Croon Barrett, PA-C      . apixaban (ELIQUIS) tablet 5 mg  5 mg Oral BID Theone Murdoch Hammons, RPH   5 mg at 04/15/15 2140  . doxycycline (VIBRA-TABS) tablet 100 mg  100 mg Oral BID Josue Hector, MD   100 mg at 04/16/15 0827  . fluticasone (FLONASE) 50 MCG/ACT nasal spray 2 spray  2 spray Each Nare Daily Rhonda G Barrett, PA-C   2 spray at 04/15/15 1046  . insulin aspart (novoLOG) injection 0-15 Units  0-15 Units Subcutaneous TID WC Evelene Croon Barrett, PA-C   2 Units at 04/16/15 0640  . insulin aspart (novoLOG) injection 0-5 Units  0-5 Units Subcutaneous QHS Evelene Croon Barrett, PA-C   0 Units at 04/13/15 2126  . loratadine (CLARITIN) tablet 10 mg  10 mg Oral Daily PRN Rhonda G Barrett, PA-C      . metoprolol (LOPRESSOR) tablet 50 mg  50 mg Oral BID Lelon Perla, MD   50 mg at 04/15/15 2140  . multivitamin with minerals tablet 1 tablet  1 tablet Oral Daily Josue Hector, MD   1 tablet at 04/15/15 1046  . nitroGLYCERIN (NITROSTAT) SL tablet 0.4 mg  0.4 mg  Sublingual Q5 Min x 3 PRN Rhonda G Barrett, PA-C      . ondansetron (ZOFRAN) injection 4 mg  4 mg Intravenous Q6H PRN Rhonda G Barrett, PA-C      . rosuvastatin (CRESTOR) tablet 20 mg  20 mg Oral q1800 Rhonda G Barrett, PA-C   20 mg at 04/15/15 1949  . tiZANidine (ZANAFLEX) tablet 4 mg  4 mg Oral Q6H PRN Rhonda G Barrett, PA-C      . zolpidem (AMBIEN) tablet 5 mg  5 mg Oral QHS PRN Lonn Georgia, PA-C        PE: General appearance: alert, cooperative, no distress and moderately obese Neck: supple Lungs: CTA Heart: irregular ryththm Abd: soft, not tender or distended Extremities: no LEE Skin: warm and dry Neurologic: Grossly intact  Lab Results:   Recent Labs  04/13/15 1147  WBC 7.7  HGB 16.5  HCT 48.6  PLT 181   BMET  Recent Labs  04/13/15 1147  NA 142  K 4.6  CL 110  CO2 25  GLUCOSE 73  BUN 16  CREATININE 0.75  CALCIUM 9.0   PT/INR  Recent Labs  04/13/15 1147  LABPROT 14.8  INR 1.14      Assessment/Plan  Principal Problem:   Atrial flutter (HCC)  Active Problems:   Impaired glucose tolerance  1. Atrial Flutter w/ RVR: Patient remains in atrial flutter. His heart rate is high normal. Continue metoprolol 50 mg twice a day. Continue apixaban. Echocardiogram shows ejection fraction 50-55%. Plan TEE guided cardioversion on Monday. Note patient apparently has a history of atrial fibrillation based on notes. Therefore would not pursue ablation of atrial flutter. CHADSvasc 2 for age and diet controlled DM; will need long term anticoagulation. 2 hyperlipidemia-continue statin.   LOS: 3 days   Kirk Ruths, [place] 04/16/2015 9:41 AM

## 2015-04-17 NOTE — Discharge Instructions (Signed)
Information on my medicine - ELIQUIS (apixaban)  This medication education was reviewed with me or my healthcare representative as part of my discharge preparation.  The pharmacist that spoke with me during my hospital stay was:  Dareen Piano, [facility]  Why was Eliquis prescribed for you? Eliquis was prescribed for you to reduce the risk of forming blood clots that can cause a stroke if you have a medical condition called atrial fibrillation (a type of irregular heartbeat) OR to reduce the risk of a blood clots forming after orthopedic surgery.  What do You need to know about Eliquis ? Take your Eliquis TWICE DAILY - one tablet in the morning and one tablet in the evening with or without food.  It would be best to take the doses about the same time each day.  If you have difficulty swallowing the tablet whole please discuss with your pharmacist how to take the medication safely.  Take Eliquis exactly as prescribed by your doctor and DO NOT stop taking Eliquis without talking to the doctor who prescribed the medication.  Stopping may increase your risk of developing a new clot or stroke.  Refill your prescription before you run out.  After discharge, you should have regular check-up appointments with your healthcare provider that is prescribing your Eliquis.  In the future your dose may need to be changed if your kidney function or weight changes by a significant amount or as you get older.  What do you do if you miss a dose? If you miss a dose, take it as soon as you remember on the same day and resume taking twice daily.  Do not take more than one dose of ELIQUIS at the same time.  Important Safety Information A possible side effect of Eliquis is bleeding. You should call your healthcare provider right away if you experience any of the following: ? Bleeding from an injury or your nose that does not stop. ? Unusual colored urine (red or dark brown) or unusual colored stools (red or  black). ? Unusual bruising for unknown reasons. ? A serious fall or if you hit your head (even if there is no bleeding).  Some medicines may interact with Eliquis and might increase your risk of bleeding or clotting while on Eliquis. To help avoid this, consult your healthcare provider or pharmacist prior to using any new prescription or non-prescription medications, including herbals, vitamins, non-steroidal anti-inflammatory drugs (NSAIDs) and supplements.  This website has more information on Eliquis (apixaban): [email or web address].

## 2015-04-17 NOTE — Anesthesia Procedure Notes (Signed)
Procedure Name: MAC Date/Time: 04/17/2015 1:54 PM Performed by: Garrison Columbus T Pre-anesthesia Checklist: Patient identified, Emergency Drugs available, Suction available and Patient being monitored Patient Re-evaluated:Patient Re-evaluated prior to inductionOxygen Delivery Method: Simple face mask Preoxygenation: Pre-oxygenation with 100% oxygen Intubation Type: IV induction Placement Confirmation: positive ETCO2 and breath sounds checked- equal and bilateral Dental Injury: Teeth and Oropharynx as per pre-operative assessment

## 2015-04-17 NOTE — Anesthesia Preprocedure Evaluation (Addendum)
Anesthesia Evaluation  Patient identified by MRN, date of birth, ID band Patient awake    Reviewed: Allergy & Precautions, NPO status , Patient's Chart, lab work & pertinent test results  Airway Mallampati: II   Neck ROM: full    Dental  (+) Poor Dentition   Pulmonary shortness of breath, sleep apnea ,    breath sounds clear to auscultation       Cardiovascular + dysrhythmias Atrial Fibrillation  Rhythm:regular Rate:Normal  EF 50%, has been on apixaban   Neuro/Psych  Neuromuscular disease    GI/Hepatic hiatal hernia, GERD  ,  Endo/Other  diabetes, Type 2Morbid obesity  Renal/GU      Musculoskeletal  (+) Arthritis , Osteoarthritis,    Abdominal   Peds  Hematology   Anesthesia Other Findings No lower extremity edema  Reproductive/Obstetrics                            Anesthesia Physical  Anesthesia Plan  ASA: III  Anesthesia Plan: MAC   Post-op Pain Management:    Induction: Intravenous  Airway Management Planned: Nasal Cannula  Additional Equipment:   Intra-op Plan:   Post-operative Plan:   Informed Consent: I have reviewed the patients History and Physical, chart, labs and discussed the procedure including the risks, benefits and alternatives for the proposed anesthesia with the patient or authorized representative who has indicated his/her understanding and acceptance.   Dental advisory given  Plan Discussed with: CRNA and Anesthesiologist  Anesthesia Plan Comments:        Anesthesia Quick Evaluation

## 2015-04-17 NOTE — Transfer of Care (Signed)
Immediate Anesthesia Transfer of Care Note  Patient: Eric Santos  Procedure(s) Performed: Procedure(s): CARDIOVERSION (N/A) TRANSESOPHAGEAL ECHOCARDIOGRAM (TEE) (N/A)  Patient Location: Endoscopy Unit  Anesthesia Type:MAC  Level of Consciousness: awake, alert  and oriented  Airway & Oxygen Therapy: Patient Spontanous Breathing and Patient connected to nasal cannula oxygen  Post-op Assessment: Report given to RN, Post -op Vital signs reviewed and stable and Patient moving all extremities X 4  Post vital signs: Reviewed and stable  Last Vitals:  Filed Vitals:   04/17/15 1006 04/17/15 1310  BP: 105/84   Pulse: 62 90  Temp:  36.3 C  Resp:  18    Complications: No apparent anesthesia complications

## 2015-04-17 NOTE — H&P (View-Only) (Signed)
   Patient Name: Eric Santos Date of Encounter: 04/17/2015   SUBJECTIVE  Feeling well. No chest pain, sob or palpitations.   CURRENT MEDS . apixaban  5 mg Oral BID  . doxycycline  100 mg Oral BID  . fluticasone  2 spray Each Nare Daily  . insulin aspart  0-15 Units Subcutaneous TID WC  . insulin aspart  0-5 Units Subcutaneous QHS  . metoprolol tartrate  50 mg Oral BID  . multivitamin with minerals  1 tablet Oral Daily  . rosuvastatin  20 mg Oral q1800    OBJECTIVE  Filed Vitals:   04/16/15 1242 04/16/15 1939 04/17/15 0518 04/17/15 0520  BP: 106/79 123/71 105/78   Pulse: 62 108 86   Temp:  97.8 F (36.6 C) 97.8 F (36.6 C)   TempSrc:  Oral Oral   Resp: 20 18 18    Height:      Weight:    261 lb 14.5 oz (118.8 kg)  SpO2: 98% 96% 97%     Intake/Output Summary (Last 24 hours) at 04/17/15 0809 Last data filed at 04/16/15 1919  Gross per 24 hour  Intake    480 ml  Output    600 ml  Net   -120 ml   Filed Weights   04/15/15 0442 04/16/15 0451 04/17/15 0520  Weight: 262 lb 6.4 oz (119.024 kg) 261 lb 6.4 oz (118.57 kg) 261 lb 14.5 oz (118.8 kg)    PHYSICAL EXAM  General: Pleasant, NAD. Neuro: Alert and oriented X 3. Moves all extremities spontaneously. Psych: Normal affect. HEENT:  Normal  Neck: Supple without bruits or JVD. Lungs:  Resp regular and unlabored, CTA. Heart: RRR no s3, s4, or murmurs. Abdomen: Soft, non-tender, non-distended, BS + x 4.  Extremities: No clubbing, cyanosis or edema. DP/PT/Radials 2+ and equal bilaterally.  Accessory Clinical Findings   TELE  aflutter  Radiology/Studies  No results found.  ASSESSMENT AND PLAN  1. Atrial Flutter w/ RVR: Patient remains in atrial flutter. His heart rate is high normal. Continue metoprolol 50 mg twice a day. Continue apixaban. Echocardiogram shows ejection fraction 50-55%. For TEE/DCCV today.  Note patient apparently has a history of atrial fibrillation based on notes. Therefore would not pursue  ablation of atrial flutter. CHADSvasc 2 for age and diet controlled DM; will need long term anticoagulation. 2  Hyperlipidemia-continue statin. 3. Impaired glucose tolerance - A1c of 6.1  Signed, Bhagat,Bhavinkumar PA-C Pager [phone]  Patient known to me from admission from office Has been on eliquis since Thursday.  On beta blocker For rate control  TEE/DCC today    [name]

## 2015-04-17 NOTE — Interval H&P Note (Signed)
History and Physical Interval Note:  04/17/2015 9:02 AM  Eric Santos  has presented today for surgery, with the diagnosis of aflutter  The various methods of treatment have been discussed with the patient and family. After consideration of risks, benefits and other options for treatment, the patient has consented to  Procedure(s): CARDIOVERSION (N/A) TRANSESOPHAGEAL ECHOCARDIOGRAM (TEE) (N/A) as a surgical intervention .  The patient's history has been reviewed, patient examined, no change in status, stable for surgery.  I have reviewed the patient's chart and labs.  Questions were answered to the patient's satisfaction.     [name]

## 2015-04-17 NOTE — Progress Notes (Signed)
Echocardiogram Echocardiogram Transesophageal has been performed.  Tresa Res 04/17/2015, 4:05 PM

## 2015-04-17 NOTE — Interval H&P Note (Signed)
History and Physical Interval Note:  04/17/2015 12:08 PM  Eric Santos  has presented today for surgery, with the diagnosis of aflutter  The various methods of treatment have been discussed with the patient and family. After consideration of risks, benefits and other options for treatment, the patient has consented to  Procedure(s): CARDIOVERSION (N/A) TRANSESOPHAGEAL ECHOCARDIOGRAM (TEE) (N/A) as a surgical intervention .  The patient's history has been reviewed, patient examined, no change in status, stable for surgery.  I have reviewed the patient's chart and labs.  Questions were answered to the patient's satisfaction.     [name]

## 2015-04-17 NOTE — Op Note (Signed)
Patient anesthetized with propofol by anesthesia With pads in AP position pt cardioverted to SR with 200 J synchronized biphasic energy 12 lead EKG pending  Procedure without complication.

## 2015-04-17 NOTE — Op Note (Signed)
LA, LAA without masses TV is normal  Trace TR MV is normal  Mild MR AV is mildly thickened.  Mild AI PV is normal  Trace PI LVEF is at least moderately depressed Normal thoracic aorta.

## 2015-04-17 NOTE — Progress Notes (Signed)
   Patient Name: Eric Santos Date of Encounter: 04/17/2015   SUBJECTIVE  Feeling well. No chest pain, sob or palpitations.   CURRENT MEDS . apixaban  5 mg Oral BID  . doxycycline  100 mg Oral BID  . fluticasone  2 spray Each Nare Daily  . insulin aspart  0-15 Units Subcutaneous TID WC  . insulin aspart  0-5 Units Subcutaneous QHS  . metoprolol tartrate  50 mg Oral BID  . multivitamin with minerals  1 tablet Oral Daily  . rosuvastatin  20 mg Oral q1800    OBJECTIVE  Filed Vitals:   04/16/15 1242 04/16/15 1939 04/17/15 0518 04/17/15 0520  BP: 106/79 123/71 105/78   Pulse: 62 108 86   Temp:  97.8 F (36.6 C) 97.8 F (36.6 C)   TempSrc:  Oral Oral   Resp: 20 18 18    Height:      Weight:    261 lb 14.5 oz (118.8 kg)  SpO2: 98% 96% 97%     Intake/Output Summary (Last 24 hours) at 04/17/15 0809 Last data filed at 04/16/15 1919  Gross per 24 hour  Intake    480 ml  Output    600 ml  Net   -120 ml   Filed Weights   04/15/15 0442 04/16/15 0451 04/17/15 0520  Weight: 262 lb 6.4 oz (119.024 kg) 261 lb 6.4 oz (118.57 kg) 261 lb 14.5 oz (118.8 kg)    PHYSICAL EXAM  General: Pleasant, NAD. Neuro: Alert and oriented X 3. Moves all extremities spontaneously. Psych: Normal affect. HEENT:  Normal  Neck: Supple without bruits or JVD. Lungs:  Resp regular and unlabored, CTA. Heart: RRR no s3, s4, or murmurs. Abdomen: Soft, non-tender, non-distended, BS + x 4.  Extremities: No clubbing, cyanosis or edema. DP/PT/Radials 2+ and equal bilaterally.  Accessory Clinical Findings   TELE  aflutter  Radiology/Studies  No results found.  ASSESSMENT AND PLAN  1. Atrial Flutter w/ RVR: Patient remains in atrial flutter. His heart rate is high normal. Continue metoprolol 50 mg twice a day. Continue apixaban. Echocardiogram shows ejection fraction 50-55%. For TEE/DCCV today.  Note patient apparently has a history of atrial fibrillation based on notes. Therefore would not pursue  ablation of atrial flutter. CHADSvasc 2 for age and diet controlled DM; will need long term anticoagulation. 2  Hyperlipidemia-continue statin. 3. Impaired glucose tolerance - A1c of 6.1  Signed, Bhagat,Bhavinkumar PA-C Pager [phone]  Patient known to me from admission from office Has been on eliquis since Thursday.  On beta blocker For rate control  TEE/DCC today    [name]

## 2015-04-18 ENCOUNTER — Encounter (HOSPITAL_COMMUNITY): Payer: Self-pay | Admitting: Internal Medicine

## 2015-04-18 ENCOUNTER — Telehealth: Payer: Self-pay | Admitting: *Deleted

## 2015-04-18 DIAGNOSIS — R7302 Impaired glucose tolerance (oral): Secondary | ICD-10-CM | POA: Diagnosis not present

## 2015-04-18 DIAGNOSIS — I483 Typical atrial flutter: Secondary | ICD-10-CM | POA: Diagnosis not present

## 2015-04-18 DIAGNOSIS — I4892 Unspecified atrial flutter: Secondary | ICD-10-CM | POA: Diagnosis not present

## 2015-04-18 DIAGNOSIS — G4730 Sleep apnea, unspecified: Secondary | ICD-10-CM | POA: Diagnosis not present

## 2015-04-18 DIAGNOSIS — E785 Hyperlipidemia, unspecified: Secondary | ICD-10-CM | POA: Diagnosis not present

## 2015-04-18 LAB — GLUCOSE, CAPILLARY
Glucose-Capillary: 96 mg/dL (ref 65–99)
Glucose-Capillary: 113 mg/dL — ABNORMAL HIGH (ref 65–99)

## 2015-04-18 MED ORDER — APIXABAN 5 MG PO TABS: 5.0000 mg | ORAL_TABLET | Freq: Two times a day (BID) | ORAL | Status: DC

## 2015-04-18 MED ORDER — METOPROLOL TARTRATE 50 MG PO TABS: 50.0000 mg | ORAL_TABLET | Freq: Two times a day (BID) | ORAL | Status: DC
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
## 2015-04-18 NOTE — Telephone Encounter (Signed)
Patients wife called and stated that per [facility], the eliquis will require a prior authorization.

## 2015-04-18 NOTE — Progress Notes (Signed)
Reviewed discharge with patient and family. IV removed. No issued at present. Awaiting wheelchair for discharge.   Chelsea Nusz, Mervin Kung RN

## 2015-04-18 NOTE — Progress Notes (Signed)
Patient Name: Eric Santos Date of Encounter: 04/18/2015   SUBJECTIVE  Feeling well. No chest pain, sob or palpitations. No complains.   CURRENT MEDS . apixaban  5 mg Oral BID  . doxycycline  100 mg Oral BID  . fluticasone  2 spray Each Nare Daily  . insulin aspart  0-15 Units Subcutaneous TID WC  . insulin aspart  0-5 Units Subcutaneous QHS  . metoprolol tartrate  50 mg Oral BID  . multivitamin with minerals  1 tablet Oral Daily  . rosuvastatin  20 mg Oral q1800    OBJECTIVE  Filed Vitals:   04/17/15 2111 04/17/15 2223 04/18/15 0455 04/18/15 0458  BP: 108/72  102/71   Pulse:   74   Temp:  97.8 F (36.6 C) 97.6 F (36.4 C)   TempSrc:  Oral Oral   Resp:      Height:      Weight:    262 lb 3.2 oz (118.933 kg)  SpO2:  96% 94%     Intake/Output Summary (Last 24 hours) at 04/18/15 0803 Last data filed at 04/18/15 0400  Gross per 24 hour  Intake    640 ml  Output    600 ml  Net     40 ml   Filed Weights   04/17/15 0520 04/17/15 1310 04/18/15 0458  Weight: 261 lb 14.5 oz (118.8 kg) 261 lb (118.389 kg) 262 lb 3.2 oz (118.933 kg)    PHYSICAL EXAM  General: Pleasant, NAD. Neuro: Alert and oriented X 3. Moves all extremities spontaneously. Psych: Normal affect. HEENT:  Normal  Neck: Supple without bruits or JVD. Lungs:  Resp regular and unlabored, CTA. Heart: RRR no s3, s4, or murmurs. Abdomen: Soft, non-tender, non-distended, BS + x 4.  Extremities: No clubbing, cyanosis or edema. DP/PT/Radials 2+ and equal bilaterally.  Accessory Clinical Findings   TELE  Sinus rhythm with rare PACs and PVCs  TEE 04/17/15 Indications:   Atrial flutter 427.32.  ------------------------------------------------------------------- Study Conclusions  - Left atrium: No evidence of thrombus in the atrial cavity or appendage. No evidence of thrombus in the atrial cavity or appendage.  Diagnostic transesophageal echocardiography. 2D and color Doppler. Birthdate:  Patient birthdate: Jul 30, 1945. Age: Patient is 70 yr old. Sex: Gender: male.  BMI: 42.2 kg/m^2. Blood pressure: 99/73 Patient status: Outpatient. Study date: Study date: 04/17/2015. Study time: 01:58 PM. Location: Endoscopy.  -------------------------------------------------------------------  ------------------------------------------------------------------- Left ventricle: LVEF is at least moderately depressed.  ------------------------------------------------------------------- Aortic valve: AV is mildly thickened. Mild AI.  ------------------------------------------------------------------- Aorta: Normal thoracic aorta.  ------------------------------------------------------------------- Mitral valve: MV is normal Mild MR.  ------------------------------------------------------------------- Left atrium:  No evidence of thrombus in the atrial cavity or appendage. No evidence of thrombus in the atrial cavity or appendage.  ------------------------------------------------------------------- Pulmonic valve:  PV is normal Trace PI.  ------------------------------------------------------------------- Tricuspid valve: TV is normal Trace PI.  ------------------------------------------------------------------- Post procedure conclusions Ascending Aorta:  - Normal thoracic aorta.   Radiology/Studies  No results found.  ASSESSMENT AND PLAN  1. Atrial Flutter w/ RVR - TEE without thrombus, Mild MR. S/p successful cardioversion. Tele showed he is maintaining sinus rhythm. No post procedure EKG, will get one.  - Continue metoprolol 50 mg twice a day and apixaban.  Echocardiogram shows ejection fraction 50-55%. For TEE/DCCV today.   - Note patient apparently has a history of atrial fibrillation based on notes. Therefore would not pursue ablation of atrial flutter. CHADSvasc 2 for age and diet controlled DM; will need long term anticoagulation. 2  Hyperlipidemia-continue statin. 3. Impaired glucose tolerance - A1c of 6.1 4. 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 No more "quivering " in chest.  TEE with better EF than TTE.   Maint NSR  Continue eliquis f/u with me as outpatient next available.  No skin burns from [facility] Exam otherwise remarkable for obesity  [name]

## 2015-04-19 ENCOUNTER — Telehealth: Payer: Self-pay | Admitting: *Deleted

## 2015-04-19 NOTE — Telephone Encounter (Signed)
Pt was on TCM list admitted for Atrial Flutter. D/C 2/14, will be f/u w/cardiology 05/01/15...[name]

## 2015-04-21 ENCOUNTER — Telehealth: Payer: Self-pay

## 2015-04-21 NOTE — Anesthesia Postprocedure Evaluation (Signed)
Anesthesia Post Note  Patient: Eric Santos  Procedure(s) Performed: Procedure(s) (LRB): CARDIOVERSION (N/A) TRANSESOPHAGEAL ECHOCARDIOGRAM (TEE) (N/A)  Patient location during evaluation: PACU Anesthesia Type: General Level of consciousness: awake and alert Pain management: pain level controlled Vital Signs Assessment: post-procedure vital signs reviewed and stable Respiratory status: spontaneous breathing, nonlabored ventilation, respiratory function stable and patient connected to nasal cannula oxygen Cardiovascular status: blood pressure returned to baseline and stable Postop Assessment: no signs of nausea or vomiting Anesthetic complications: no    Last Vitals:  Filed Vitals:   04/18/15 0455 04/18/15 0826  BP: 102/71 124/79  Pulse: 74 75  Temp: 36.4 C   Resp:      Last Pain:  Filed Vitals:   04/18/15 0826  PainSc: 0-No pain                 Tricia Oaxaca,JAMES TERRILL

## 2015-04-21 NOTE — Telephone Encounter (Signed)
Prior auth for Eliquis 5 mg sent to [facility]. 

## 2015-04-21 NOTE — Telephone Encounter (Signed)
Prior auth sent to [facility] for Eliquis 5mg 

## 2015-04-21 NOTE — Telephone Encounter (Signed)
Patient aware.

## 2015-04-22 ENCOUNTER — Emergency Department (HOSPITAL_COMMUNITY)
Admission: EM | Admit: 2015-04-22 | Discharge: 2015-04-22 | Disposition: A | Discharge status: Home / Self Care | Payer: Medicare Other | Attending: Emergency Medicine | Admitting: Emergency Medicine

## 2015-04-22 ENCOUNTER — Encounter (HOSPITAL_COMMUNITY): Payer: Self-pay

## 2015-04-22 DIAGNOSIS — Q6100 Congenital renal cyst, unspecified: Secondary | ICD-10-CM | POA: Insufficient documentation

## 2015-04-22 DIAGNOSIS — E785 Hyperlipidemia, unspecified: Secondary | ICD-10-CM | POA: Insufficient documentation

## 2015-04-22 DIAGNOSIS — M1990 Unspecified osteoarthritis, unspecified site: Secondary | ICD-10-CM | POA: Diagnosis not present

## 2015-04-22 DIAGNOSIS — Z8719 Personal history of other diseases of the digestive system: Secondary | ICD-10-CM | POA: Insufficient documentation

## 2015-04-22 DIAGNOSIS — Z8709 Personal history of other diseases of the respiratory system: Secondary | ICD-10-CM | POA: Diagnosis not present

## 2015-04-22 DIAGNOSIS — I483 Typical atrial flutter: Secondary | ICD-10-CM | POA: Insufficient documentation

## 2015-04-22 DIAGNOSIS — Z8669 Personal history of other diseases of the nervous system and sense organs: Secondary | ICD-10-CM | POA: Diagnosis not present

## 2015-04-22 DIAGNOSIS — Z87442 Personal history of urinary calculi: Secondary | ICD-10-CM | POA: Insufficient documentation

## 2015-04-22 DIAGNOSIS — Z85828 Personal history of other malignant neoplasm of skin: Secondary | ICD-10-CM | POA: Insufficient documentation

## 2015-04-22 DIAGNOSIS — Z791 Long term (current) use of non-steroidal anti-inflammatories (NSAID): Secondary | ICD-10-CM | POA: Diagnosis not present

## 2015-04-22 DIAGNOSIS — Z87438 Personal history of other diseases of male genital organs: Secondary | ICD-10-CM | POA: Diagnosis not present

## 2015-04-22 DIAGNOSIS — R002 Palpitations: Secondary | ICD-10-CM | POA: Diagnosis present

## 2015-04-22 DIAGNOSIS — Z87828 Personal history of other (healed) physical injury and trauma: Secondary | ICD-10-CM | POA: Insufficient documentation

## 2015-04-22 DIAGNOSIS — Z7901 Long term (current) use of anticoagulants: Secondary | ICD-10-CM | POA: Insufficient documentation

## 2015-04-22 DIAGNOSIS — E119 Type 2 diabetes mellitus without complications: Secondary | ICD-10-CM | POA: Insufficient documentation

## 2015-04-22 DIAGNOSIS — Z7951 Long term (current) use of inhaled steroids: Secondary | ICD-10-CM | POA: Diagnosis not present

## 2015-04-22 LAB — BASIC METABOLIC PANEL
Anion gap: 11 (ref 5–15)
BUN: 18 mg/dL (ref 6–20)
CHLORIDE: 106 mmol/L (ref 101–111)
CO2: 23 mmol/L (ref 22–32)
CREATININE: 0.98 mg/dL (ref 0.61–1.24)
Calcium: 9.3 mg/dL (ref 8.9–10.3)
GFR calc Af Amer: >60 mL/min (ref >60)
GLUCOSE: 100 mg/dL — AB (ref 65–99)
POTASSIUM: 4.3 mmol/L (ref 3.5–5.1)
Sodium: 140 mmol/L (ref 135–145)

## 2015-04-22 LAB — CBC
HEMATOCRIT: 52.5 % — AB (ref 39.0–52.0)
Hemoglobin: 18 g/dL — ABNORMAL HIGH (ref 13.0–17.0)
MCH: 30.7 pg (ref 26.0–34.0)
MCHC: 34.3 g/dL (ref 30.0–36.0)
MCV: 89.6 fL (ref 78.0–100.0)
Platelets: 194 10*3/uL (ref 150–400)
RBC: 5.86 MIL/uL — ABNORMAL HIGH (ref 4.22–5.81)
RDW: 13.1 % (ref 11.5–15.5)
WBC: 9.1 10*3/uL (ref 4.0–10.5)

## 2015-04-22 MED ORDER — PROPOFOL 10 MG/ML IV BOLUS
INTRAVENOUS | Status: AC | PRN
  Administered 2015-04-22: 50 mg via INTRAVENOUS

## 2015-04-22 MED ORDER — METOPROLOL TARTRATE 1 MG/ML IV SOLN
5.0000 mg | Freq: Once | INTRAVENOUS | Status: DC
  Filled 2015-04-22: qty 5

## 2015-04-22 MED ORDER — PROPOFOL 10 MG/ML IV BOLUS
1.0000 mg/kg | Freq: Once | INTRAVENOUS | Status: DC
  Filled 2015-04-22: qty 20

## 2015-04-22 MED ORDER — METOPROLOL TARTRATE 25 MG PO TABS: 25.0000 mg | ORAL_TABLET | Freq: Once | ORAL | Status: DC

## 2015-04-22 MED ORDER — SODIUM CHLORIDE 0.9 % IV BOLUS (SEPSIS)
1000.0000 mL | Freq: Once | INTRAVENOUS | Status: AC
  Administered 2015-04-22: 1000 mL via INTRAVENOUS

## 2015-04-22 MED ORDER — METOPROLOL TARTRATE 50 MG PO TABS: 50.0000 mg | ORAL_TABLET | Freq: Two times a day (BID) | ORAL | Status: DC

## 2015-04-22 MED ORDER — METOPROLOL TARTRATE 25 MG PO TABS
50.0000 mg | ORAL_TABLET | Freq: Once | ORAL | Status: AC
  Administered 2015-04-22: 50 mg via ORAL
  Filled 2015-04-22: qty 2

## 2015-04-22 NOTE — Discharge Instructions (Signed)
Atrial Flutter °Atrial flutter is a heart rhythm that can cause the heart to beat very fast (tachycardia). It originates in the upper chambers of the heart (atria). In atrial flutter, the top chambers of the heart (atria) often beat much faster than the bottom chambers of the heart (ventricles). Atrial flutter has a regular "saw toothed" appearance in an EKG readout. An EKG is a test that records the electrical activity of the heart. Atrial flutter can cause the heart to beat up to 150 beats per minute (BPM). Atrial flutter can either be short lived (paroxysmal) or permanent.  °CAUSES  °Causes of atrial flutter can be many. Some of these include: °· Heart related issues: °¨ Heart attack (myocardial infarction). °¨ Heart failure. °¨ Heart valve problems. °¨ Poorly controlled high blood pressure (hypertension). °¨ After open heart surgery. °· Lung related issues: °¨ A blood clot in the lungs (pulmonary embolism). °¨ Chronic obstructive pulmonary disease (COPD). Medications used to treat COPD can attribute to atrial flutter. °· Other related causes: °¨ Hyperthyroidism. °¨ Caffeine. °¨ Some decongestant cold medications. °¨ Low electrolyte levels such as potassium or magnesium. °¨ Cocaine. °SYMPTOMS °· An awareness of your heart beating rapidly (palpitations). °· Shortness of breath. °· Chest pain. °· Low blood pressure (hypotension). °· Dizziness or fainting. °DIAGNOSIS  °Different tests can be performed to diagnose atrial flutter.  °· An EKG. °· Holter monitor. This is a 24-hour recording of your heart rhythm. You will also be given a diary. Write down all symptoms that you have and what you were doing at the time you experienced symptoms. °· Cardiac event monitor. This small device can be worn for up to 30 days. When you have heart symptoms, you will push a button on the device. This will then record your heart rhythm. °· Echocardiogram. This is an imaging test to look at your heart. Your caregiver will look at your  heart valves and the ventricles. °· Stress test. This test can help determine if the atrial flutter is related to exercise or if coronary artery disease is present. °· Laboratory studies will look at certain blood levels like: °¨ Complete blood count (CBC). °¨ Potassium. °¨ Magnesium. °¨ Thyroid function. °TREATMENT  °Treatment of atrial flutter varies. A combination of therapies may be used or sometimes atrial flutter may need only 1 type of treatment.  °Lab work: °If your blood work, such as your electrolytes (potassium, magnesium) or your thyroid function tests, are abnormal, your caregiver will treat them accordingly.  °Medication:  °There are several different types of medications that can convert your heart to a normal rhythm and prevent atrial flutter from reoccurring.  °Nonsurgical procedures: °Nonsurgical techniques may be used to control atrial flutter. Some examples include: °· Cardioversion. This technique uses either drugs or an electrical shock to restore a normal heart rhythm: °¨ Cardioversion drugs may be given through an intravenous (IV) line to help "reset" the heart rhythm. °¨ In electrical cardioversion, your caregiver shocks your heart with electrical energy. This helps to reset the heartbeat to a normal rhythm. °· Ablation. If atrial flutter is a persistent problem, an ablation may be needed. This procedure is done under mild sedation. High frequency radio-wave energy is used to destroy the area of heart tissue responsible for atrial flutter. °SEEK IMMEDIATE MEDICAL CARE IF:  °You have: °· Dizziness. °· Near fainting or fainting. °· Shortness of breath. °· Chest pain or pressure. °· Sudden nausea or vomiting. °· Profuse sweating. °If you have the above symptoms,   call your local emergency service immediately! Do not drive yourself to the hospital. °MAKE SURE YOU:  °· Understand these instructions. °· Will watch your condition. °· Will get help right away if you are not doing well or get worse. °   °This information is not intended to replace advice given to you by your health care provider. Make sure you discuss any questions you have with your health care provider. °  °Document Released: 07/07/2008 Document Revised: 03/11/2014 Document Reviewed: 09/02/2014 °Elsevier Interactive Patient Education ©2016 [employer] ° °

## 2015-04-22 NOTE — ED Notes (Signed)
Pt a x 4, NAD. MD at bedside speaking with pt and family.

## 2015-04-22 NOTE — ED Notes (Signed)
Per pt he has not taken

## 2015-04-22 NOTE — ED Notes (Signed)
Pt reports onset this morning while checking BS, BP, HR noticed HR was 156.  No c/o chest pain or shortness of breath.  NAD.  Pt d/c from hospital 04-18-15 d/t A fib, pt was cardioverted and has felt well this past week.

## 2015-04-22 NOTE — Sedation Documentation (Signed)
Pt shocked at 150.

## 2015-04-22 NOTE — Sedation Documentation (Signed)
MD at bedside. 
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
## 2015-04-24 ENCOUNTER — Telehealth: Payer: Self-pay

## 2015-04-24 ENCOUNTER — Telehealth: Payer: Self-pay | Admitting: Cardiovascular Disease

## 2015-04-24 NOTE — Telephone Encounter (Signed)
Eric Santos is calling to notify Dr. Johnsie Cancel that  Eric Santos has to go back to the [facility] on Saturday and have his heart Shocked back in rhythm.  Thanks

## 2015-04-24 NOTE — Telephone Encounter (Signed)
Eliquis approeved through 03/03/2016. PA - [id number].

## 2015-04-27 ENCOUNTER — Other Ambulatory Visit: Payer: Self-pay | Admitting: Internal Medicine

## 2015-05-01 ENCOUNTER — Encounter: Payer: Self-pay | Admitting: Physician Assistant

## 2015-05-01 ENCOUNTER — Ambulatory Visit (INDEPENDENT_AMBULATORY_CARE_PROVIDER_SITE_OTHER): Payer: Medicare Other | Admitting: Physician Assistant

## 2015-05-01 VITALS — BP 110/80 | HR 64 | Ht 66.0 in | Wt 264.8 lb

## 2015-05-01 DIAGNOSIS — R0683 Snoring: Secondary | ICD-10-CM

## 2015-05-01 DIAGNOSIS — E785 Hyperlipidemia, unspecified: Secondary | ICD-10-CM

## 2015-05-01 DIAGNOSIS — G4733 Obstructive sleep apnea (adult) (pediatric): Secondary | ICD-10-CM

## 2015-05-01 DIAGNOSIS — I483 Typical atrial flutter: Secondary | ICD-10-CM

## 2015-05-01 MED ORDER — METOPROLOL TARTRATE 25 MG PO TABS: 25.0000 mg | ORAL_TABLET | Freq: Two times a day (BID) | ORAL | Status: DC

## 2015-05-01 NOTE — Patient Instructions (Addendum)
Medication Instructions:  Your physician has recommended you make the following change in your medication:  REDUCE Metoprolol to 25mg  twice daily  Labwork: None ordered  Testing/Procedures: Your physician has recommended that you have a sleep study. This test records several body functions during sleep, including: brain activity, eye movement, oxygen and carbon dioxide blood levels, heart rate and rhythm, breathing rate and rhythm, the flow of air through your mouth and nose, snoring, body muscle movements, and chest and belly movement.   Follow-Up: Your physician recommends that you schedule a follow-up appointment in: 3 months with Dr.Nishan   Any Other Special Instructions Will Be Listed Below (If Applicable).     If you need a refill on your cardiac medications before your next appointment, please call your pharmacy.
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
## 2015-05-24 ENCOUNTER — Other Ambulatory Visit: Payer: Self-pay | Admitting: *Deleted

## 2015-05-24 MED ORDER — FLUTICASONE PROPIONATE 50 MCG/ACT NA SUSP: 1.0000 | Freq: Every day | NASAL | Status: DC

## 2015-05-24 NOTE — Telephone Encounter (Signed)
Received call pt is needing flonase sent to [facility]...Eric Santos

## 2015-06-09 ENCOUNTER — Encounter: Payer: Self-pay | Admitting: Internal Medicine

## 2015-06-09 ENCOUNTER — Ambulatory Visit (INDEPENDENT_AMBULATORY_CARE_PROVIDER_SITE_OTHER): Payer: Medicare Other | Admitting: Internal Medicine

## 2015-06-09 VITALS — BP 120/84 | HR 64 | Temp 97.9°F | Resp 20 | Wt 257.0 lb

## 2015-06-09 DIAGNOSIS — R7302 Impaired glucose tolerance (oral): Secondary | ICD-10-CM

## 2015-06-09 DIAGNOSIS — K219 Gastro-esophageal reflux disease without esophagitis: Secondary | ICD-10-CM

## 2015-06-09 DIAGNOSIS — M545 Low back pain, unspecified: Secondary | ICD-10-CM

## 2015-06-09 DIAGNOSIS — I483 Typical atrial flutter: Secondary | ICD-10-CM | POA: Diagnosis not present

## 2015-06-09 MED ORDER — TRAMADOL HCL 50 MG PO TABS: 50.0000 mg | ORAL_TABLET | Freq: Four times a day (QID) | ORAL | Status: DC | PRN

## 2015-06-09 NOTE — Assessment & Plan Note (Signed)
Mod to occas severe, c/w acute on chronic most likely at least an element of msk spasm, for tramadol prn, has zanaflex prn at home - ok to use, consider steroid trial,  to f/u any worsening symptoms or concerns

## 2015-06-09 NOTE — Assessment & Plan Note (Signed)
stable overall by history and exam, recent data reviewed with pt, and pt to continue medical treatment as before,  to f/u any worsening symptoms or concerns Lab Results  Component Value Date   HGBA1C 6.1* 04/13/2015

## 2015-06-09 NOTE — Assessment & Plan Note (Signed)
stable overall by history and exam, recent data reviewed with pt, and pt to continue medical treatment as before,  to f/u any worsening symptoms or concerns  Lab Results  Component Value Date   WBC 9.1 04/22/2015   HGB 18.0* 04/22/2015   HCT 52.5* 04/22/2015   PLT 194 04/22/2015   GLUCOSE 100* 04/22/2015   CHOL 201* 01/11/2015   TRIG 132.0 01/11/2015   HDL 37.80* 01/11/2015   LDLCALC 137* 01/11/2015   ALT 18 04/13/2015   AST 19 04/13/2015   NA 140 04/22/2015   K 4.3 04/22/2015   CL 106 04/22/2015   CREATININE 0.98 04/22/2015   BUN 18 04/22/2015   CO2 23 04/22/2015   TSH 1.397 04/13/2015   PSA 1.08 01/11/2015   INR 1.14 04/13/2015   HGBA1C 6.1* 04/13/2015   MICROALBUR 2.8* 10/03/2009

## 2015-06-09 NOTE — Progress Notes (Signed)
Pre visit review using our clinic review tool, if applicable. No additional management support is needed unless otherwise documented below in the visit note. 
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
## 2015-06-09 NOTE — Assessment & Plan Note (Signed)
Reg rhythm today, ecg not done, pt without symptoms, appears second cardioversion and current meds working well,  to f/u any worsening symptoms or concern

## 2015-06-09 NOTE — Patient Instructions (Signed)
Please take all new medication as prescribed - the tramadol for pain  Also ok to take the zanaflex you have at home  Please continue all other medications as before, and refills have been done if requested.  Please have the pharmacy call with any other refills you may need.  Please keep your appointments with your specialists as you may have planned

## 2015-07-05 ENCOUNTER — Ambulatory Visit (HOSPITAL_BASED_OUTPATIENT_CLINIC_OR_DEPARTMENT_OTHER): Payer: Medicare Other | Attending: Cardiology | Admitting: Cardiology

## 2015-07-05 VITALS — Ht 66.0 in | Wt 264.0 lb

## 2015-07-05 DIAGNOSIS — G4733 Obstructive sleep apnea (adult) (pediatric): Secondary | ICD-10-CM | POA: Insufficient documentation

## 2015-07-05 DIAGNOSIS — R0683 Snoring: Secondary | ICD-10-CM | POA: Diagnosis not present

## 2015-07-15 ENCOUNTER — Telehealth: Payer: Self-pay | Admitting: Cardiology

## 2015-07-15 ENCOUNTER — Encounter (HOSPITAL_BASED_OUTPATIENT_CLINIC_OR_DEPARTMENT_OTHER): Payer: Self-pay | Admitting: Cardiology

## 2015-07-15 DIAGNOSIS — G4733 Obstructive sleep apnea (adult) (pediatric): Secondary | ICD-10-CM | POA: Insufficient documentation

## 2015-07-15 HISTORY — DX: Obstructive sleep apnea (adult) (pediatric): G47.33

## 2015-07-15 NOTE — Telephone Encounter (Signed)
Please let patient know that they have significant sleep apnea and did not have successful CPAP titration due to severity of OSA.  Please set up BiPAP titration.

## 2015-07-15 NOTE — Procedures (Signed)
Patient Name: Eric Santos, Eric Santos MRN: [id number] Study Date: 07/05/2015 Gender: Male D.O.B: 1945-05-25 Age (years): 89 Referring Provider: Fransico Him MD, ABSM Interpreting Physician: Fransico Him MD, ABSM RPSGT: Joni Reining  Weight (lbs): 264 BMI: 43 Height (inches): 66 Neck Size: 18.00  CLINICAL INFORMATION Sleep Study Type: Split Night CPAP Indication for sleep study: Snoring Epworth Sleepiness Score: 9  SLEEP STUDY TECHNIQUE As per the AASM Manual for the Scoring of Sleep and Associated Events v2.3 (April 2016) with a hypopnea requiring 4% desaturations. The channels recorded and monitored were frontal, central and occipital EEG, electrooculogram (EOG), submentalis EMG (chin), nasal and oral airflow, thoracic and abdominal wall motion, anterior tibialis EMG, snore microphone, electrocardiogram, and pulse oximetry. Continuous positive airway pressure (CPAP) was initiated when the patient met split night criteria and was titrated according to treat sleep-disordered breathing.  MEDICATIONS Medications taken by the patient : Reviewed in the chart Medications administered by patient during sleep study : No sleep medicine administered.  RESPIRATORY PARAMETERS Diagnostic Total AHI (/hr): 40.9  RDI (/hr):40.9   OA Index (/hr): 37.1       CA Index (/hr): 0.0 REM AHI (/hr): 72.0  NREM AHI (/hr):30.1  Supine AHI (/hr):76.6  Non-supine AHI (/hr):8.98 Min O2 Sat (%):77.00  Mean O2 (%) 90.98  Time below 88% (min):11.3    Titration Optimal Pressure (cm):N/A  AHI at Optimal Pressure (/hr):N/A  Min O2 at Optimal Pressure (%):N/A Supine % at Optimal (%):N/A Sleep % at Optimal (%):N/A    SLEEP ARCHITECTURE The recording time for the entire night was 399.2 minutes. During a baseline period of 173.9 minutes, the patient slept for 126.3 minutes in REM and nonREM, yielding a reduced sleep efficiency of 72.6%. Sleep onset after lights out was 33.1 minutes with a REM latency of 60.0  minutes. The patient spent 8.71% of the night in stage N1 sleep, 65.56% in stage N2 sleep, 0.00% in stage N3 and 25.73% in REM.   During the titration period of 218.9 minutes, the patient slept for 120.1 minutes in REM and nonREM, yielding a reduced sleep efficiency of 54.8%. Sleep onset after CPAP initiation was 10.4 minutes with a REM latency of 127.5 minutes. The patient spent 23.32% of the night in stage N1 sleep, 42.12% in stage N2 sleep, 0.00% in stage N3 and 34.56% in REM.  CARDIAC DATA The 2 lead EKG demonstrated sinus rhythm. The mean heart rate was 57.80 beats per minute. Other EKG findings include: None.  LEG MOVEMENT DATA The total Periodic Limb Movements of Sleep (PLMS) were 33. The PLMS index was 8.03 .  IMPRESSIONS - Severe obstructive sleep apnea occurred during the diagnostic portion of the study (AHI = 40.9/hour). An optimal PAP pressure was selected for this patient ( 16 cm of water) - No significant central sleep apnea occurred during the diagnostic portion of the study (CAI = 0.0/hour). - Moderate oxygen desaturation was noted during the diagnostic portion of the study (Min O2 =77.00%). - The patient snored with Soft snoring volume during the diagnostic portion of the study. - No cardiac abnormalities were noted during this study. - Mild periodic limb movements of sleep occurred during the study.  DIAGNOSIS - Obstructive Sleep Apnea (327.23 [G47.33 ICD-10])  RECOMMENDATIONS - The patient was tried but unsuccessful at  CPAP titration due to continued respiratory events.  Recommend repeat study with BiPAP titration.   - Avoid alcohol, sedatives and other CNS depressants that may worsen sleep apnea and disrupt normal sleep architecture. - Sleep hygiene  should be reviewed to assess factors that may improve sleep quality. - Weight management and regular exercise should be initiated or continued.    [place], American Board of Sleep  Medicine  ELECTRONICALLY SIGNED ON:  07/15/2015, 6:35 PM [place] PH: [phone]   FX: [phone] [place]

## 2015-07-19 NOTE — Telephone Encounter (Signed)
Spoke with patient and gave him the results of his sleep study. As per Dr.Turner's note, I let him know that the sleep lab was unable to get him titrated on the CPAP machine due to the severity of the OSA, and Dr.Turner recommended he go back to the lab for BIPAP titration. Patient agreed with the recommendation. He did state that the mask they put him on during his sleep study hurt his nose because they had to pull it tight to try and stop it from leaking. I told him I'd let them know this when his titration was set up. 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 I told him we wouldn't schedule until June. I let patient know I was unsure if he would have to have a prior auth for his titration and would leave a message for [facility] when she was back in the office. He verbalized understanding and had no questions regarding the information I gave him. He thanked me for my call.

## 2015-07-20 NOTE — Addendum Note (Signed)
Addended by: Andres Ege on: 07/20/2015 01:39 PM   Modules accepted: Orders
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
## 2015-08-02 ENCOUNTER — Ambulatory Visit (INDEPENDENT_AMBULATORY_CARE_PROVIDER_SITE_OTHER): Payer: Medicare Other | Admitting: Cardiovascular Disease

## 2015-08-02 ENCOUNTER — Encounter: Payer: Self-pay | Admitting: Cardiovascular Disease

## 2015-08-02 VITALS — BP 104/64 | HR 64 | Ht 66.0 in | Wt 255.8 lb

## 2015-08-02 DIAGNOSIS — R002 Palpitations: Secondary | ICD-10-CM

## 2015-08-02 NOTE — Patient Instructions (Addendum)

## 2015-08-05 ENCOUNTER — Encounter (HOSPITAL_COMMUNITY): Payer: Self-pay | Admitting: *Deleted

## 2015-08-05 ENCOUNTER — Emergency Department (HOSPITAL_COMMUNITY): Payer: Medicare Other

## 2015-08-05 ENCOUNTER — Emergency Department (HOSPITAL_COMMUNITY)
Admission: EM | Admit: 2015-08-05 | Discharge: 2015-08-06 | Disposition: A | Discharge status: Home / Self Care | Payer: Medicare Other | Attending: Emergency Medicine | Admitting: Emergency Medicine

## 2015-08-05 DIAGNOSIS — Q6100 Congenital renal cyst, unspecified: Secondary | ICD-10-CM | POA: Insufficient documentation

## 2015-08-05 DIAGNOSIS — Z85828 Personal history of other malignant neoplasm of skin: Secondary | ICD-10-CM | POA: Diagnosis not present

## 2015-08-05 DIAGNOSIS — E119 Type 2 diabetes mellitus without complications: Secondary | ICD-10-CM | POA: Diagnosis not present

## 2015-08-05 DIAGNOSIS — Z8719 Personal history of other diseases of the digestive system: Secondary | ICD-10-CM | POA: Insufficient documentation

## 2015-08-05 DIAGNOSIS — Z8739 Personal history of other diseases of the musculoskeletal system and connective tissue: Secondary | ICD-10-CM | POA: Diagnosis not present

## 2015-08-05 DIAGNOSIS — Z87438 Personal history of other diseases of male genital organs: Secondary | ICD-10-CM | POA: Insufficient documentation

## 2015-08-05 DIAGNOSIS — Z79899 Other long term (current) drug therapy: Secondary | ICD-10-CM | POA: Insufficient documentation

## 2015-08-05 DIAGNOSIS — Z87828 Personal history of other (healed) physical injury and trauma: Secondary | ICD-10-CM | POA: Diagnosis not present

## 2015-08-05 DIAGNOSIS — E785 Hyperlipidemia, unspecified: Secondary | ICD-10-CM | POA: Insufficient documentation

## 2015-08-05 DIAGNOSIS — Z7901 Long term (current) use of anticoagulants: Secondary | ICD-10-CM | POA: Diagnosis not present

## 2015-08-05 DIAGNOSIS — Z9981 Dependence on supplemental oxygen: Secondary | ICD-10-CM | POA: Insufficient documentation

## 2015-08-05 DIAGNOSIS — I4892 Unspecified atrial flutter: Secondary | ICD-10-CM | POA: Insufficient documentation

## 2015-08-05 DIAGNOSIS — R002 Palpitations: Secondary | ICD-10-CM | POA: Diagnosis present

## 2015-08-05 DIAGNOSIS — Z87442 Personal history of urinary calculi: Secondary | ICD-10-CM | POA: Insufficient documentation

## 2015-08-05 DIAGNOSIS — G4733 Obstructive sleep apnea (adult) (pediatric): Secondary | ICD-10-CM | POA: Diagnosis not present

## 2015-08-05 DIAGNOSIS — Z7951 Long term (current) use of inhaled steroids: Secondary | ICD-10-CM | POA: Insufficient documentation

## 2015-08-05 LAB — CBC
HCT: 48 % (ref 39.0–52.0)
Hemoglobin: 15.9 g/dL (ref 13.0–17.0)
MCH: 30.1 pg (ref 26.0–34.0)
MCHC: 33.1 g/dL (ref 30.0–36.0)
MCV: 90.9 fL (ref 78.0–100.0)
PLATELETS: 191 10*3/uL (ref 150–400)
RBC: 5.28 MIL/uL (ref 4.22–5.81)
RDW: 14 % (ref 11.5–15.5)
WBC: 8 10*3/uL (ref 4.0–10.5)

## 2015-08-05 LAB — I-STAT TROPONIN, ED: Troponin i, poc: 0.01 ng/mL (ref 0.00–0.08)

## 2015-08-05 LAB — BASIC METABOLIC PANEL
Anion gap: 7 (ref 5–15)
BUN: 23 mg/dL — ABNORMAL HIGH (ref 6–20)
CHLORIDE: 109 mmol/L (ref 101–111)
CO2: 25 mmol/L (ref 22–32)
CREATININE: 0.97 mg/dL (ref 0.61–1.24)
Calcium: 9.3 mg/dL (ref 8.9–10.3)
Glucose, Bld: 132 mg/dL — ABNORMAL HIGH (ref 65–99)
POTASSIUM: 4.7 mmol/L (ref 3.5–5.1)
SODIUM: 141 mmol/L (ref 135–145)

## 2015-08-05 MED ORDER — SODIUM CHLORIDE 0.9 % IV BOLUS (SEPSIS)
1000.0000 mL | Freq: Once | INTRAVENOUS | Status: AC
  Administered 2015-08-06: 1000 mL via INTRAVENOUS

## 2015-08-05 MED ORDER — METOPROLOL TARTRATE 5 MG/5ML IV SOLN
2.5000 mg | Freq: Once | INTRAVENOUS | Status: AC
  Administered 2015-08-05: 2.5 mg via INTRAVENOUS
  Filled 2015-08-05: qty 5

## 2015-08-05 MED ORDER — SODIUM CHLORIDE 0.9 % IV BOLUS (SEPSIS)
500.0000 mL | Freq: Once | INTRAVENOUS | Status: DC
  Administered 2015-08-06: 500 mL via INTRAVENOUS

## 2015-08-05 NOTE — ED Notes (Signed)
Pt also reports shortness of breath with exertion

## 2015-08-05 NOTE — ED Notes (Signed)
Pt in c/o palpitations since yesterday afternoon, reports chest heaviness also, alert and oriented, no distress noted

## 2015-08-05 NOTE — ED Notes (Signed)
Report given to rn on 2w 
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
## 2015-08-06 MED ORDER — ETOMIDATE 2 MG/ML IV SOLN
10.0000 mg | Freq: Once | INTRAVENOUS | Status: AC
Start: 2015-08-06 — End: 2015-08-06
  Administered 2015-08-06: 10 mg via INTRAVENOUS
  Filled 2015-08-06: qty 10

## 2015-08-06 NOTE — ED Notes (Signed)
The pt has been in af since yesterday  He has a history of the same .  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  He decided to come in tonight after the af did not stop.  Alert oriented skin warm and dry  Family at the bedside

## 2015-08-06 NOTE — Sedation Documentation (Signed)
Cardioversion initiated with 150J of syncronized. Pt now back in NSR HR 60

## 2015-08-06 NOTE — Discharge Instructions (Signed)
Atrial Flutter °Atrial flutter is a heart rhythm that can cause the heart to beat very fast (tachycardia). It originates in the upper chambers of the heart (atria). In atrial flutter, the top chambers of the heart (atria) often beat much faster than the bottom chambers of the heart (ventricles). Atrial flutter has a regular "saw toothed" appearance in an EKG readout. An EKG is a test that records the electrical activity of the heart. Atrial flutter can cause the heart to beat up to 150 beats per minute (BPM). Atrial flutter can either be short lived (paroxysmal) or permanent.  °CAUSES  °Causes of atrial flutter can be many. Some of these include: °· Heart related issues: °¨ Heart attack (myocardial infarction). °¨ Heart failure. °¨ Heart valve problems. °¨ Poorly controlled high blood pressure (hypertension). °¨ After open heart surgery. °· Lung related issues: °¨ A blood clot in the lungs (pulmonary embolism). °¨ Chronic obstructive pulmonary disease (COPD). Medications used to treat COPD can attribute to atrial flutter. °· Other related causes: °¨ Hyperthyroidism. °¨ Caffeine. °¨ Some decongestant cold medications. °¨ Low electrolyte levels such as potassium or magnesium. °¨ Cocaine. °SYMPTOMS °· An awareness of your heart beating rapidly (palpitations). °· Shortness of breath. °· Chest pain. °· Low blood pressure (hypotension). °· Dizziness or fainting. °DIAGNOSIS  °Different tests can be performed to diagnose atrial flutter.  °· An EKG. °· Holter monitor. This is a 24-hour recording of your heart rhythm. You will also be given a diary. Write down all symptoms that you have and what you were doing at the time you experienced symptoms. °· Cardiac event monitor. This small device can be worn for up to 30 days. When you have heart symptoms, you will push a button on the device. This will then record your heart rhythm. °· Echocardiogram. This is an imaging test to look at your heart. Your caregiver will look at your  heart valves and the ventricles. °· Stress test. This test can help determine if the atrial flutter is related to exercise or if coronary artery disease is present. °· Laboratory studies will look at certain blood levels like: °¨ Complete blood count (CBC). °¨ Potassium. °¨ Magnesium. °¨ Thyroid function. °TREATMENT  °Treatment of atrial flutter varies. A combination of therapies may be used or sometimes atrial flutter may need only 1 type of treatment.  °Lab work: °If your blood work, such as your electrolytes (potassium, magnesium) or your thyroid function tests, are abnormal, your caregiver will treat them accordingly.  °Medication:  °There are several different types of medications that can convert your heart to a normal rhythm and prevent atrial flutter from reoccurring.  °Nonsurgical procedures: °Nonsurgical techniques may be used to control atrial flutter. Some examples include: °· Cardioversion. This technique uses either drugs or an electrical shock to restore a normal heart rhythm: °¨ Cardioversion drugs may be given through an intravenous (IV) line to help "reset" the heart rhythm. °¨ In electrical cardioversion, your caregiver shocks your heart with electrical energy. This helps to reset the heartbeat to a normal rhythm. °· Ablation. If atrial flutter is a persistent problem, an ablation may be needed. This procedure is done under mild sedation. High frequency radio-wave energy is used to destroy the area of heart tissue responsible for atrial flutter. °SEEK IMMEDIATE MEDICAL CARE IF:  °You have: °· Dizziness. °· Near fainting or fainting. °· Shortness of breath. °· Chest pain or pressure. °· Sudden nausea or vomiting. °· Profuse sweating. °If you have the above symptoms,   call your local emergency service immediately! Do not drive yourself to the hospital. °MAKE SURE YOU:  °· Understand these instructions. °· Will watch your condition. °· Will get help right away if you are not doing well or get worse. °   °This information is not intended to replace advice given to you by your health care provider. Make sure you discuss any questions you have with your health care provider. °  °Document Released: 07/07/2008 Document Revised: 03/11/2014 Document Reviewed: 09/02/2014 °Elsevier Interactive Patient Education ©2016 [employer] ° °

## 2015-08-06 NOTE — ED Notes (Signed)
Pt remains alert no distress.  Preparing to cardiovert the pt

## 2015-08-06 NOTE — ED Notes (Signed)
Pt sitting talking to his family.  Still in nsr  He reports that he feels fine

## 2015-08-06 NOTE — ED Notes (Signed)
The pt is sleeping sats 100%  Still in fib flutter

## 2015-08-06 NOTE — ED Notes (Signed)
Zoll pads on patient. Consent signed by pt and this RN as witness and placed at the bedside.

## 2015-08-06 NOTE — ED Notes (Signed)
The pts pulse is slower sats keep alarming low but he is alert color good  Pulse 0x was not working properly earlier [employer]

## 2015-08-06 NOTE — ED Notes (Signed)
Pt alert and oriented x 4. Family at bedside.  

## 2015-08-16 ENCOUNTER — Ambulatory Visit (HOSPITAL_COMMUNITY)
Admission: RE | Admit: 2015-08-16 | Discharge: 2015-08-16 | Disposition: A | Discharge status: Home / Self Care | Payer: Medicare Other | Source: Ambulatory Visit | Attending: Nurse Practitioner | Admitting: Nurse Practitioner

## 2015-08-16 ENCOUNTER — Other Ambulatory Visit: Payer: Self-pay

## 2015-08-16 ENCOUNTER — Encounter (HOSPITAL_COMMUNITY): Payer: Self-pay | Admitting: Nurse Practitioner

## 2015-08-16 VITALS — BP 130/84 | HR 66 | Ht 66.0 in | Wt 255.8 lb

## 2015-08-16 DIAGNOSIS — I4891 Unspecified atrial fibrillation: Secondary | ICD-10-CM | POA: Insufficient documentation

## 2015-08-16 DIAGNOSIS — I483 Typical atrial flutter: Secondary | ICD-10-CM

## 2015-08-17 ENCOUNTER — Encounter (HOSPITAL_COMMUNITY): Payer: Self-pay | Admitting: Nurse Practitioner
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
## 2015-08-18 ENCOUNTER — Encounter: Payer: Self-pay | Admitting: Internal Medicine

## 2015-08-18 ENCOUNTER — Ambulatory Visit (INDEPENDENT_AMBULATORY_CARE_PROVIDER_SITE_OTHER): Payer: Medicare Other | Admitting: Internal Medicine

## 2015-08-18 VITALS — BP 118/78 | HR 89 | Ht 66.0 in | Wt 256.2 lb

## 2015-08-18 DIAGNOSIS — G4733 Obstructive sleep apnea (adult) (pediatric): Secondary | ICD-10-CM | POA: Diagnosis not present

## 2015-08-18 DIAGNOSIS — I483 Typical atrial flutter: Secondary | ICD-10-CM

## 2015-08-18 LAB — CBC WITH DIFFERENTIAL/PLATELET
BASOS ABS: 0 {cells}/uL (ref 0–200)
Basophils Relative: 0 %
EOS PCT: 3 %
Eosinophils Absolute: 291 cells/uL (ref 15–500)
HEMATOCRIT: 46.3 % (ref 38.5–50.0)
HEMOGLOBIN: 16 g/dL (ref 13.2–17.1)
LYMPHS ABS: 3104 {cells}/uL (ref 850–3900)
Lymphocytes Relative: 32 %
MCH: 30.8 pg (ref 27.0–33.0)
MCHC: 34.6 g/dL (ref 32.0–36.0)
MCV: 89.2 fL (ref 80.0–100.0)
MONO ABS: 1164 {cells}/uL — AB (ref 200–950)
MPV: 11.8 fL (ref 7.5–12.5)
Monocytes Relative: 12 %
NEUTROS ABS: 5141 {cells}/uL (ref 1500–7800)
NEUTROS PCT: 53 %
Platelets: 219 10*3/uL (ref 140–400)
RBC: 5.19 MIL/uL (ref 4.20–5.80)
RDW: 13.7 % (ref 11.0–15.0)
WBC: 9.7 10*3/uL (ref 3.8–10.8)

## 2015-08-18 LAB — BASIC METABOLIC PANEL
BUN: 16 mg/dL (ref 7–25)
CALCIUM: 9.1 mg/dL (ref 8.6–10.3)
CO2: 25 mmol/L (ref 20–31)
Chloride: 104 mmol/L (ref 98–110)
Creat: 0.75 mg/dL (ref 0.70–1.25)
GLUCOSE: 87 mg/dL (ref 65–99)
POTASSIUM: 4.6 mmol/L (ref 3.5–5.3)
SODIUM: 139 mmol/L (ref 135–146)

## 2015-08-18 NOTE — Patient Instructions (Signed)
Medication Instructions:  Your physician recommends that you continue on your current medications as directed. Please refer to the Current Medication list given to you today.    Labwork: Your physician recommends that you return for lab work today: BMP/CBC   Testing/Procedures: Your physician has recommended that you have an ablation. Catheter ablation is a medical procedure used to treat some cardiac arrhythmias (irregular heartbeats). During catheter ablation, a long, thin, flexible tube is put into a blood vessel in your groin (upper thigh), or neck. This tube is called an ablation catheter. It is then guided to your heart through the blood vessel. Radio frequency waves destroy small areas of heart tissue where abnormal heartbeats may cause an arrhythmia to start. Please see the instruction sheet given to you today.  Please arrive at the [facility] at 7:30am on 09/01/15 Do not eat of drink after midnight the night prior to the procedure Do not take your Metoprolol the night before procedure or the morning of the procedure Hold all medications the morning of the test Plan for one night stay at the hospital  Follow-Up: Your physician recommends that you schedule a follow-up appointment in: 4 weeks from 09/01/15 with Dr Rayann Heman   Any Other Special Instructions Will Be Listed Below (If Applicable).     If you need a refill on your cardiac medications before your next appointment, please call your pharmacy.
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
## 2015-09-01 ENCOUNTER — Ambulatory Visit (HOSPITAL_COMMUNITY)
Admission: RE | Admit: 2015-09-01 | Discharge: 2015-09-01 | Disposition: A | Discharge status: Home / Self Care | Payer: Medicare Other | Source: Ambulatory Visit | Attending: Internal Medicine | Admitting: Internal Medicine

## 2015-09-01 ENCOUNTER — Encounter (HOSPITAL_COMMUNITY): Payer: Self-pay | Admitting: Certified Registered"

## 2015-09-01 ENCOUNTER — Ambulatory Visit (HOSPITAL_COMMUNITY): Payer: Medicare Other | Admitting: Certified Registered"

## 2015-09-01 ENCOUNTER — Encounter (HOSPITAL_COMMUNITY)
Admission: RE | Disposition: A | Discharge status: Home / Self Care | Payer: Self-pay | Source: Ambulatory Visit | Attending: Internal Medicine

## 2015-09-01 DIAGNOSIS — E785 Hyperlipidemia, unspecified: Secondary | ICD-10-CM | POA: Insufficient documentation

## 2015-09-01 DIAGNOSIS — Z8719 Personal history of other diseases of the digestive system: Secondary | ICD-10-CM | POA: Diagnosis not present

## 2015-09-01 DIAGNOSIS — Z79899 Other long term (current) drug therapy: Secondary | ICD-10-CM | POA: Diagnosis not present

## 2015-09-01 DIAGNOSIS — Z96651 Presence of right artificial knee joint: Secondary | ICD-10-CM | POA: Insufficient documentation

## 2015-09-01 DIAGNOSIS — Z7901 Long term (current) use of anticoagulants: Secondary | ICD-10-CM | POA: Insufficient documentation

## 2015-09-01 DIAGNOSIS — Z792 Long term (current) use of antibiotics: Secondary | ICD-10-CM | POA: Insufficient documentation

## 2015-09-01 DIAGNOSIS — J309 Allergic rhinitis, unspecified: Secondary | ICD-10-CM | POA: Insufficient documentation

## 2015-09-01 DIAGNOSIS — E669 Obesity, unspecified: Secondary | ICD-10-CM | POA: Insufficient documentation

## 2015-09-01 DIAGNOSIS — E119 Type 2 diabetes mellitus without complications: Secondary | ICD-10-CM | POA: Insufficient documentation

## 2015-09-01 DIAGNOSIS — I483 Typical atrial flutter: Secondary | ICD-10-CM | POA: Diagnosis not present

## 2015-09-01 DIAGNOSIS — G4733 Obstructive sleep apnea (adult) (pediatric): Secondary | ICD-10-CM | POA: Insufficient documentation

## 2015-09-01 DIAGNOSIS — Z6841 Body Mass Index (BMI) 40.0 and over, adult: Secondary | ICD-10-CM | POA: Diagnosis not present

## 2015-09-01 DIAGNOSIS — I471 Supraventricular tachycardia: Secondary | ICD-10-CM | POA: Diagnosis present

## 2015-09-01 DIAGNOSIS — I4892 Unspecified atrial flutter: Secondary | ICD-10-CM | POA: Diagnosis present

## 2015-09-01 HISTORY — PX: ELECTROPHYSIOLOGIC STUDY: SHX172A

## 2015-09-01 LAB — GLUCOSE, CAPILLARY
GLUCOSE-CAPILLARY: 92 mg/dL (ref 65–99)
Glucose-Capillary: 114 mg/dL — ABNORMAL HIGH (ref 65–99)

## 2015-09-01 SURGERY — A-FLUTTER/A-TACH/SVT ABLATION
Anesthesia: General

## 2015-09-01 MED ORDER — SODIUM CHLORIDE 0.9% FLUSH: 3.0000 mL | INTRAVENOUS | Status: DC | PRN

## 2015-09-01 MED ORDER — OFF THE BEAT BOOK
Freq: Once | Status: AC
Start: 2015-09-01 — End: 2015-09-01
  Administered 2015-09-01: 20:00:00
  Filled 2015-09-01: qty 1

## 2015-09-01 MED ORDER — BUPIVACAINE HCL (PF) 0.25 % IJ SOLN
INTRAMUSCULAR | Status: DC | PRN
  Administered 2015-09-01: 20 mL

## 2015-09-01 MED ORDER — ACETAMINOPHEN 325 MG PO TABS: 650.0000 mg | ORAL_TABLET | ORAL | Status: DC | PRN

## 2015-09-01 MED ORDER — OXYCODONE-ACETAMINOPHEN 5-325 MG PO TABS: 1.0000 | ORAL_TABLET | ORAL | Status: DC | PRN

## 2015-09-01 MED ORDER — APIXABAN 5 MG PO TABS: 5.0000 mg | ORAL_TABLET | Freq: Two times a day (BID) | ORAL | Status: DC

## 2015-09-01 MED ORDER — DOXYCYCLINE HYCLATE 100 MG PO TABS
100.0000 mg | ORAL_TABLET | Freq: Two times a day (BID) | ORAL | Status: DC
Start: 2015-09-01 — End: 2015-09-01
  Filled 2015-09-01: qty 1

## 2015-09-01 MED ORDER — SODIUM CHLORIDE 0.9 % IV SOLN: 250.0000 mL | INTRAVENOUS | Status: DC | PRN

## 2015-09-01 MED ORDER — LIDOCAINE 2% (20 MG/ML) 5 ML SYRINGE
INTRAMUSCULAR | Status: DC | PRN
  Administered 2015-09-01: 100 mg via INTRAVENOUS

## 2015-09-01 MED ORDER — SODIUM CHLORIDE 0.9% FLUSH
3.0000 mL | Freq: Two times a day (BID) | INTRAVENOUS | Status: DC
  Administered 2015-09-01: 16:00:00 3 mL via INTRAVENOUS

## 2015-09-01 MED ORDER — FLUTICASONE PROPIONATE 50 MCG/ACT NA SUSP
1.0000 | Freq: Every day | NASAL | Status: DC
  Filled 2015-09-01: qty 16

## 2015-09-01 MED ORDER — HYDROCODONE-ACETAMINOPHEN 5-325 MG PO TABS: 1.0000 | ORAL_TABLET | ORAL | Status: DC | PRN

## 2015-09-01 MED ORDER — BUPIVACAINE HCL (PF) 0.25 % IJ SOLN
INTRAMUSCULAR | Status: AC
  Filled 2015-09-01: qty 30

## 2015-09-01 MED ORDER — SODIUM CHLORIDE 0.9 % IV SOLN
INTRAVENOUS | Status: DC
  Administered 2015-09-01: 08:00:00 via INTRAVENOUS

## 2015-09-01 MED ORDER — RISAQUAD PO CAPS
1.0000 | ORAL_CAPSULE | Freq: Every day | ORAL | Status: DC
  Filled 2015-09-01: qty 1

## 2015-09-01 MED ORDER — MIDAZOLAM HCL 5 MG/5ML IJ SOLN
INTRAMUSCULAR | Status: DC | PRN
  Administered 2015-09-01: 2 mg via INTRAVENOUS

## 2015-09-01 MED ORDER — EPHEDRINE SULFATE 50 MG/ML IJ SOLN
INTRAMUSCULAR | Status: DC | PRN
  Administered 2015-09-01: 5 mg via INTRAVENOUS

## 2015-09-01 MED ORDER — ONDANSETRON HCL 4 MG/2ML IJ SOLN: 4.0000 mg | Freq: Four times a day (QID) | INTRAMUSCULAR | Status: DC | PRN

## 2015-09-01 MED ORDER — FENTANYL CITRATE (PF) 250 MCG/5ML IJ SOLN
INTRAMUSCULAR | Status: DC | PRN
  Administered 2015-09-01: 50 ug via INTRAVENOUS

## 2015-09-01 MED ORDER — TRAMADOL HCL 50 MG PO TABS: 50.0000 mg | ORAL_TABLET | Freq: Four times a day (QID) | ORAL | Status: DC | PRN

## 2015-09-01 MED ORDER — PROPOFOL 10 MG/ML IV BOLUS
INTRAVENOUS | Status: DC | PRN
  Administered 2015-09-01: 120 mg via INTRAVENOUS
  Administered 2015-09-01: 20 mg via INTRAVENOUS

## 2015-09-01 MED ORDER — ONDANSETRON HCL 4 MG/2ML IJ SOLN
INTRAMUSCULAR | Status: DC | PRN
Start: 2015-09-01 — End: 2015-09-01
  Administered 2015-09-01: 4 mg via INTRAVENOUS

## 2015-09-01 SURGICAL SUPPLY — 15 items
BAG SNAP BAND KOVER 36X36 (MISCELLANEOUS) ×2 IMPLANT
BLANKET WARM UNDERBOD FULL ACC (MISCELLANEOUS) ×2 IMPLANT
CATH BLAZERPRIME XP LG CV 10MM (ABLATOR) ×1 IMPLANT
CATH DUODECA/ISMUS 7FR REPROC (CATHETERS) ×1 IMPLANT
CATH JOSEPH QUAD ALLRED 6F REP (CATHETERS) ×1 IMPLANT
CATH WEB BIDIR CS D-F NONAUTO (CATHETERS) ×1 IMPLANT
PACK EP LATEX FREE (CUSTOM PROCEDURE TRAY) ×2
PACK EP LF (CUSTOM PROCEDURE TRAY) ×1 IMPLANT
PAD DEFIB LIFELINK (PAD) ×2 IMPLANT
SHEATH PINNACLE 6F 10CM (SHEATH) ×1 IMPLANT
SHEATH PINNACLE 7F 10CM (SHEATH) ×1 IMPLANT
SHEATH PINNACLE 8F 10CM (SHEATH) ×1 IMPLANT
SHEATH PINNACLE 9F 10CM (SHEATH) ×1 IMPLANT
SHEATH SWARTZ RAMP 8.5F 60CM (SHEATH) ×1 IMPLANT
SHIELD RADPAD SCOOP 12X17 (MISCELLANEOUS) ×2 IMPLANT

## 2015-09-01 NOTE — Progress Notes (Signed)
Site area: RFV x 3 Site Prior to Removal:  Level 0 Pressure Applied For:45min Manual:  yes  Patient Status During Pull:stable   Post Pull Site:  Level 0 Post Pull Instructions Given: yes  Post Pull Pulses Present: palpable Dressing Applied: tegaderm  Bedrest begins @ 1320 till 1920 Comments:

## 2015-09-01 NOTE — Anesthesia Postprocedure Evaluation (Signed)
Anesthesia Post Note  Patient: Eric Santos  Procedure(s) Performed: Procedure(s) (LRB): A-Flutter Ablation (N/A)  Patient location during evaluation: PACU Anesthesia Type: General Level of consciousness: awake, oriented, sedated and patient cooperative Pain management: pain level controlled Vital Signs Assessment: post-procedure vital signs reviewed and stable Respiratory status: spontaneous breathing Cardiovascular status: blood pressure returned to baseline Anesthetic complications: no    Last Vitals:  Filed Vitals:   09/01/15 1315 09/01/15 1319  BP: 123/82 122/81  Pulse: 65 64  Temp:    Resp: 8 9    Last Pain: There were no vitals filed for this visit.               Krahl,Addalynn Kumari EDWARD

## 2015-09-01 NOTE — Transfer of Care (Signed)
Immediate Anesthesia Transfer of Care Note  Patient: Eric Santos  Procedure(s) Performed: Procedure(s): A-Flutter Ablation (N/A)  Patient Location: Cath Lab  Anesthesia Type:General  Level of Consciousness: awake, oriented and patient cooperative  Airway & Oxygen Therapy: Patient Spontanous Breathing and Patient connected to nasal cannula oxygen  Post-op Assessment: Report given to RN, Post -op Vital signs reviewed and stable and Patient moving all extremities  Post vital signs: Reviewed and stable  Last Vitals:  Filed Vitals:   09/01/15 0727  BP: 136/97  Pulse: 63  Temp: 36.4 C  Resp: 16    Last Pain: There were no vitals filed for this visit.       Complications: No apparent anesthesia complications

## 2015-09-01 NOTE — Anesthesia Procedure Notes (Signed)
Procedure Name: LMA Insertion Date/Time: 09/01/2015 10:34 AM Performed by: Melina Copa, Abagale Boulos R Pre-anesthesia Checklist: Patient identified, Emergency Drugs available, Suction available and Patient being monitored Patient Re-evaluated:Patient Re-evaluated prior to inductionOxygen Delivery Method: Circle System Utilized Preoxygenation: Pre-oxygenation with 100% oxygen Intubation Type: IV induction Ventilation: Mask ventilation without difficulty LMA: LMA inserted LMA Size: 5.0 Number of attempts: 1 Placement Confirmation: positive ETCO2 Tube secured with: Tape Dental Injury: Teeth and Oropharynx as per pre-operative assessment

## 2015-09-01 NOTE — Progress Notes (Signed)
Pt with orders to discharge home. V/S stable. No c/o any chest pain or discomfort. Pt walked in the hallway & noted HR steady at 130's without symptoms. Dr. Rayann Heman informed. Discharge instructions given. Provided home meds instructions & Dr. Hilaria Ota. Groin site instructions provided. IV's removed. Tele monitor informed. Discharged pt without complications via wheelchair with wife at side.

## 2015-09-01 NOTE — Interval H&P Note (Signed)
History and Physical Interval Note:  09/01/2015 9:49 AM  Therapeutic strategies for atrial flutter including medicine and ablation were discussed in detail with the patient, wife, and son today. Risk, benefits, and alternatives to EP study and radiofrequency ablation were also discussed in detail today. These risks include but are not limited to stroke, bleeding, vascular damage, tamponade, perforation, damage to the heart and other structures, AV block requiring pacemaker, worsening renal function, and death. The patient understands these risk and wishes to proceed at this time. Reports compliance with eliquis without interruption.   Eric Santos  has presented today for surgery, with the diagnosis of Flutter  The various methods of treatment have been discussed with the patient and family. After consideration of risks, benefits and other options for treatment, the patient has consented to  Procedure(s): A-Flutter Ablation (N/A) as a surgical intervention .  The patient's history has been reviewed, patient examined, no change in status, stable for surgery.  I have reviewed the patient's chart and labs.  Questions were answered to the patient's satisfaction.     [name]
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
## 2015-09-01 NOTE — Discharge Instructions (Signed)
No driving for 4 days. No lifting over 5 lbs for 1 week. No vigorous or sexual activity for 1 week. You may return to work on 09/08/15. Keep procedure site clean & dry. If you notice increased pain, swelling, bleeding or pus, call/return!  You may shower, but no soaking baths/hot tubs/pools for 1 week.

## 2015-09-01 NOTE — Progress Notes (Signed)
Doing well s/p ablation No concerns or complaints VSS, exam is unchanged  DC to home at 1900 Resume eliquis in am Routine post op care Stop metoprolol Follow-up with me in 4 weeks  [name] MD, [facility] 09/01/2015 4:19 PM

## 2015-09-01 NOTE — Anesthesia Preprocedure Evaluation (Signed)
Anesthesia Evaluation  Patient identified by MRN, date of birth, ID band Patient awake    Reviewed: Allergy & Precautions, NPO status , reviewed documented beta blocker date and time   Airway Mallampati: II  TM Distance: >3 FB     Dental   Pulmonary sleep apnea ,    Pulmonary exam normal  + decreased breath sounds      Cardiovascular hypertension, + dysrhythmias Atrial Fibrillation  Rhythm:Irregular Rate:Abnormal     Neuro/Psych  Neuromuscular disease    GI/Hepatic hiatal hernia, GERD  ,  Endo/Other  diabetes, Type 2  Renal/GU Renal disease     Musculoskeletal  (+) Arthritis ,   Abdominal   Peds  Hematology   Anesthesia Other Findings   Reproductive/Obstetrics                             Anesthesia Physical Anesthesia Plan  ASA: III  Anesthesia Plan: General   Post-op Pain Management:    Induction: Intravenous  Airway Management Planned: LMA  Additional Equipment:   Intra-op Plan:   Post-operative Plan: Extubation in OR  Informed Consent: I have reviewed the patients History and Physical, chart, labs and discussed the procedure including the risks, benefits and alternatives for the proposed anesthesia with the patient or authorized representative who has indicated his/her understanding and acceptance.     Plan Discussed with: CRNA, Anesthesiologist and Surgeon  Anesthesia Plan Comments:         Anesthesia Quick Evaluation

## 2015-09-04 LAB — GLUCOSE, CAPILLARY: GLUCOSE-CAPILLARY: 101 mg/dL — AB (ref 65–99)

## 2015-09-14 ENCOUNTER — Ambulatory Visit (HOSPITAL_BASED_OUTPATIENT_CLINIC_OR_DEPARTMENT_OTHER): Payer: Medicare Other | Attending: Cardiology | Admitting: Cardiology

## 2015-09-14 DIAGNOSIS — I493 Ventricular premature depolarization: Secondary | ICD-10-CM | POA: Diagnosis not present

## 2015-09-14 DIAGNOSIS — G4733 Obstructive sleep apnea (adult) (pediatric): Secondary | ICD-10-CM

## 2015-09-16 NOTE — Progress Notes (Deleted)
Subjective:     Patient ID: Eric Santos, male   DOB: [birth year], 70 y.o.   MRN: [id number]  HPI   Review of Systems     Objective:   Physical Exam     Assessment:     ***    Plan:     ***

## 2015-09-27 ENCOUNTER — Ambulatory Visit (INDEPENDENT_AMBULATORY_CARE_PROVIDER_SITE_OTHER): Payer: Medicare Other | Admitting: Internal Medicine

## 2015-09-27 ENCOUNTER — Encounter: Payer: Self-pay | Admitting: Internal Medicine

## 2015-09-27 VITALS — BP 106/86 | HR 87 | Ht 65.0 in | Wt 250.4 lb

## 2015-09-27 DIAGNOSIS — G4733 Obstructive sleep apnea (adult) (pediatric): Secondary | ICD-10-CM

## 2015-09-27 DIAGNOSIS — I483 Typical atrial flutter: Secondary | ICD-10-CM

## 2015-09-27 NOTE — Patient Instructions (Signed)
Medication Instructions:  Your physician has recommended you make the following change in your medication:  1) STOP Eliquis   Labwork: None ordered   Testing/Procedures: None ordered   Follow-Up: Your physician wants you to follow-up : AS NEEDED with Dr. Rayann Heman    Any Other Special Instructions Will Be Listed Below (If Applicable).     If you need a refill on your cardiac medications before your next appointment, please call your pharmacy.
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
## 2015-09-29 ENCOUNTER — Ambulatory Visit (HOSPITAL_COMMUNITY): Payer: Medicare Other | Admitting: Nurse Practitioner

## 2015-10-02 ENCOUNTER — Ambulatory Visit: Payer: Medicare Other | Admitting: Internal Medicine

## 2015-10-04 NOTE — Procedures (Signed)
   Patient Name: Eric Santos, Eric Santos Date: 09/14/2015 Gender: Male D.O.B: May 04, 1945 Age (years): 67 Referring Provider: Fransico Him MD, ABSM Height (inches): 66 Interpreting Physician: Fransico Him MD, ABSM Weight (lbs): 264 RPSGT: Baxter Flattery BMI: 43 MRN: [id number] Neck Size: 18.00  CLINICAL INFORMATION The patient is referred for a BiPAP titration to treat sleep apnea.   SLEEP STUDY TECHNIQUE As per the AASM Manual for the Scoring of Sleep and Associated Events v2.3 (April 2016) with a hypopnea requiring 4% desaturations. The channels recorded and monitored were frontal, central and occipital EEG, electrooculogram (EOG), submentalis EMG (chin), nasal and oral airflow, thoracic and abdominal wall motion, anterior tibialis EMG, snore microphone, electrocardiogram, and pulse oximetry. Bilevel positive airway pressure (BPAP) was initiated at the beginning of the study and titrated to treat sleep-disordered breathing.  MEDICATIONS Medications administered by patient during sleep study : No sleep medicine administered.  RESPIRATORY PARAMETERS Optimal IPAP Pressure (cm): 14  AHI at Optimal Pressure (/hr) 0.6 Optimal EPAP Pressure (cm):10   Overall Minimal O2 (%):74.00  Minimal O2 at Optimal Pressure (%): 89.0  SLEEP ARCHITECTURE Start Time:11:10:38 PM Stop Time:5:02:20 AM  Total Time (min):351.7 Total Sleep Time (min):325.5 Sleep Latency (min):11.9 Sleep Efficiency (%):92.6  REM Latency (min):119.0 WASO (min):14.3 Stage N1 (%): 4.61  Stage N2 (%): 68.97  Stage N3 (%): 0.00  Stage R (%): 26.42 Supine (%):49.77  Arousal Index (/hr):2.0      CARDIAC DATA The 2 lead EKG demonstrated sinus rhythm. The mean heart rate was 56.04 beats per minute. Other EKG findings include: PVCs  LEG MOVEMENT DATA The total Periodic Limb Movements of Sleep (PLMS) were 9. The PLMS index was 1.66. A PLMS index of <15 is considered normal in adults.  IMPRESSIONS - An optimal PAP pressure was  selected for this patient ( 14/10 cm of water) - Central sleep apnea was not noted during this titration (CAI = 1.8/h). - Severe oxygen desaturations were observed during this titration (min O2 = 74.00%). - No snoring was audible during this study. - No cardiac abnormalities were observed during this study. - Clinically significant periodic limb movements were not noted during this study. Arousals associated with PLMs were rare.  DIAGNOSIS - Obstructive Sleep Apnea (327.23 [G47.33 ICD-10])  RECOMMENDATIONS - Trial of BiPAP therapy on 14/10 cm H2O with a Large size Fisher&Paykel Nasal Mask Eson mask and heated humidification. - Avoid alcohol, sedatives and other CNS depressants that may worsen sleep apnea and disrupt normal sleep architecture. - Sleep hygiene should be reviewed to assess factors that may improve sleep quality. - Weight management and regular exercise should be initiated or continued. - Return to Sleep Center for re-evaluation after 10 weeks of therapy - Overnight pulse oximetry on BiPAP therapy.   [place], American Board of Sleep Medicine  ELECTRONICALLY SIGNED ON:  10/04/2015, 8:26 PM [place] PH: [phone]   FX: [phone] [place]

## 2015-10-06 ENCOUNTER — Telehealth: Payer: Self-pay | Admitting: *Deleted

## 2015-10-06 NOTE — Telephone Encounter (Signed)
-----   Message from Sueanne Margarita, MD sent at 10/04/2015  8:30 PM EDT ----- Pt had successful PAP titration. Please setup appointment in 10 weeks. Please let [facility] know that order for PAP is in EPIC.

## 2015-10-06 NOTE — Telephone Encounter (Signed)
Patient is aware that titration was successful and that I will be sending orders over to [facility].  He stated verbal understanding and thanked me for the call. Message has been sent to [facility] to let them know orders are in.

## 2015-10-20 ENCOUNTER — Other Ambulatory Visit: Payer: Self-pay | Admitting: *Deleted

## 2015-10-20 MED ORDER — ROSUVASTATIN CALCIUM 20 MG PO TABS
10.0000 mg | ORAL_TABLET | Freq: Every day | ORAL | 0 refills | Status: DC
Start: 2015-10-20 — End: 2016-01-02

## 2015-12-04 ENCOUNTER — Ambulatory Visit: Payer: Medicare Other | Admitting: Internal Medicine

## 2015-12-05 ENCOUNTER — Encounter: Payer: Self-pay | Admitting: Cardiology

## 2015-12-20 ENCOUNTER — Ambulatory Visit: Payer: Medicare Other | Admitting: Cardiology

## 2016-01-02 ENCOUNTER — Other Ambulatory Visit: Payer: Self-pay | Admitting: Internal Medicine

## 2016-01-03 ENCOUNTER — Encounter: Payer: Self-pay | Admitting: Cardiology

## 2016-01-03 ENCOUNTER — Ambulatory Visit (INDEPENDENT_AMBULATORY_CARE_PROVIDER_SITE_OTHER): Payer: Medicare Other | Admitting: Cardiology

## 2016-01-03 VITALS — BP 122/88 | HR 72 | Ht 66.0 in | Wt 245.0 lb

## 2016-01-03 DIAGNOSIS — E669 Obesity, unspecified: Secondary | ICD-10-CM | POA: Diagnosis not present

## 2016-01-03 DIAGNOSIS — G4733 Obstructive sleep apnea (adult) (pediatric): Secondary | ICD-10-CM
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
## 2016-01-03 NOTE — Patient Instructions (Signed)
Medication Instructions:  Your physician recommends that you continue on your current medications as directed. Please refer to the Current Medication list given to you today.   Labwork: None  Testing/Procedures: None  Follow-Up: Your physician wants you to follow-up in: 1 year with Dr. Radford Pax. You will receive a reminder letter in the mail two months in advance. If you don't receive a letter, please call our office to schedule the follow-up appointment.   Any Other Special Instructions Will Be Listed Below (If Applicable). Your BiPAP settings are being increased to 15/11 cm H2O.    If you need a refill on your cardiac medications before your next appointment, please call your pharmacy.

## 2016-01-08 ENCOUNTER — Encounter: Payer: Self-pay | Admitting: Cardiology

## 2016-01-12 ENCOUNTER — Other Ambulatory Visit (INDEPENDENT_AMBULATORY_CARE_PROVIDER_SITE_OTHER): Payer: Medicare Other

## 2016-01-12 ENCOUNTER — Encounter: Payer: Self-pay | Admitting: Internal Medicine

## 2016-01-12 ENCOUNTER — Ambulatory Visit (INDEPENDENT_AMBULATORY_CARE_PROVIDER_SITE_OTHER): Payer: Medicare Other | Admitting: Internal Medicine

## 2016-01-12 ENCOUNTER — Other Ambulatory Visit: Payer: Medicare Other

## 2016-01-12 VITALS — BP 138/80 | HR 66 | Temp 97.9°F | Resp 20 | Wt 245.0 lb

## 2016-01-12 DIAGNOSIS — Z23 Encounter for immunization: Secondary | ICD-10-CM

## 2016-01-12 DIAGNOSIS — R739 Hyperglycemia, unspecified: Secondary | ICD-10-CM

## 2016-01-12 DIAGNOSIS — Z0000 Encounter for general adult medical examination without abnormal findings: Secondary | ICD-10-CM

## 2016-01-12 LAB — CBC WITH DIFFERENTIAL/PLATELET
BASOS ABS: 0 10*3/uL (ref 0.0–0.1)
Basophils Relative: 0.3 % (ref 0.0–3.0)
EOS ABS: 0.3 10*3/uL (ref 0.0–0.7)
Eosinophils Relative: 4.2 % (ref 0.0–5.0)
HCT: 48.1 % (ref 39.0–52.0)
HEMOGLOBIN: 16.4 g/dL (ref 13.0–17.0)
LYMPHS ABS: 2.1 10*3/uL (ref 0.7–4.0)
Lymphocytes Relative: 28.7 % (ref 12.0–46.0)
MCHC: 34.1 g/dL (ref 30.0–36.0)
MCV: 89.2 fl (ref 78.0–100.0)
MONO ABS: 0.7 10*3/uL (ref 0.1–1.0)
Monocytes Relative: 9.7 % (ref 3.0–12.0)
NEUTROS PCT: 57.1 % (ref 43.0–77.0)
Neutro Abs: 4.3 10*3/uL (ref 1.4–7.7)
Platelets: 196 10*3/uL (ref 150.0–400.0)
RBC: 5.4 Mil/uL (ref 4.22–5.81)
RDW: 12.9 % (ref 11.5–15.5)
WBC: 7.5 10*3/uL (ref 4.0–10.5)

## 2016-01-12 LAB — LIPID PANEL
CHOL/HDL RATIO: 3
Cholesterol: 121 mg/dL (ref 0–200)
HDL: 37.6 mg/dL — ABNORMAL LOW (ref >39.00)
LDL Cholesterol: 58 mg/dL (ref 0–99)
NONHDL: 82.94
TRIGLYCERIDES: 126 mg/dL (ref 0.0–149.0)
VLDL: 25.2 mg/dL (ref 0.0–40.0)

## 2016-01-12 LAB — URINALYSIS, ROUTINE W REFLEX MICROSCOPIC
BILIRUBIN URINE: NEGATIVE
HGB URINE DIPSTICK: NEGATIVE
KETONES UR: NEGATIVE
LEUKOCYTES UA: NEGATIVE
NITRITE: NEGATIVE
PH: 7 (ref 5.0–8.0)
Specific Gravity, Urine: 1.01 (ref 1.000–1.030)
TOTAL PROTEIN, URINE-UPE24: NEGATIVE
URINE GLUCOSE: NEGATIVE
UROBILINOGEN UA: 0.2 (ref 0.0–1.0)

## 2016-01-12 LAB — HEPATIC FUNCTION PANEL
ALBUMIN: 4.1 g/dL (ref 3.5–5.2)
ALK PHOS: 60 U/L (ref 39–117)
ALT: 17 U/L (ref 0–53)
AST: 18 U/L (ref 0–37)
Bilirubin, Direct: 0.2 mg/dL (ref 0.0–0.3)
TOTAL PROTEIN: 6.3 g/dL (ref 6.0–8.3)
Total Bilirubin: 0.8 mg/dL (ref 0.2–1.2)

## 2016-01-12 LAB — BASIC METABOLIC PANEL
BUN: 16 mg/dL (ref 6–23)
CHLORIDE: 105 meq/L (ref 96–112)
CO2: 28 meq/L (ref 19–32)
Calcium: 9.5 mg/dL (ref 8.4–10.5)
Creatinine, Ser: 0.75 mg/dL (ref 0.40–1.50)
GFR: 109.73 mL/min (ref >60.00)
Glucose, Bld: 89 mg/dL (ref 70–99)
POTASSIUM: 4.8 meq/L (ref 3.5–5.1)
Sodium: 140 mEq/L (ref 135–145)

## 2016-01-12 LAB — TSH: TSH: 1.02 u[IU]/mL (ref 0.35–4.50)

## 2016-01-12 LAB — PSA: PSA: 1.81 ng/mL (ref 0.10–4.00)

## 2016-01-12 LAB — HEMOGLOBIN A1C: Hgb A1c MFr Bld: 5.8 % (ref 4.6–6.5)

## 2016-01-12 MED ORDER — FLUTICASONE PROPIONATE 50 MCG/ACT NA SUSP: 1.0000 | Freq: Every day | NASAL | 2 refills | Status: DC

## 2016-01-12 NOTE — Progress Notes (Signed)
Pre visit review using our clinic review tool, if applicable. No additional management support is needed unless otherwise documented below in the visit note. 

## 2016-01-12 NOTE — Patient Instructions (Addendum)
You had the flu shot today  Please continue all other medications as before, and refills have been done if requested.  Please have the pharmacy call with any other refills you may need.  Please continue your efforts at being more active, low cholesterol diet, and weight control.  You are otherwise up to date with prevention measures today.  Please keep your appointments with your specialists as you may have planned  /Please go to the LAB in the Basement (turn left off the elevator) for the tests to be done today  You will be contacted by phone if any changes need to be made immediately.  Otherwise, you will receive a letter about your results with an explanation, but please check with MyChart first.  Please remember to sign up for MyChart if you have not done so, as this will be important to you in the future with finding out test results, communicating by private email, and scheduling acute appointments online when needed.  If you have Medicare related insurance (such as traditoinal Medicare, [employer] or [employer], or similar), Please make an appointment at the Scheduling desk with Maudie Mercury, the [employer], for your Wellness Visit in this office, which is a benefit with your insurance.  Please return in 6 months, or sooner if needed
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
## 2016-01-13 NOTE — Assessment & Plan Note (Signed)

## 2016-06-27 ENCOUNTER — Ambulatory Visit (INDEPENDENT_AMBULATORY_CARE_PROVIDER_SITE_OTHER): Payer: Medicare Other | Admitting: Cardiovascular Disease

## 2016-06-27 ENCOUNTER — Encounter: Payer: Self-pay | Admitting: Cardiovascular Disease

## 2016-06-27 VITALS — BP 118/80 | HR 73 | Ht 66.0 in | Wt 250.0 lb

## 2016-06-27 DIAGNOSIS — R002 Palpitations: Secondary | ICD-10-CM | POA: Diagnosis not present
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
## 2016-06-27 NOTE — Patient Instructions (Addendum)

## 2016-07-11 ENCOUNTER — Encounter: Payer: Self-pay | Admitting: Internal Medicine

## 2016-07-11 ENCOUNTER — Ambulatory Visit (INDEPENDENT_AMBULATORY_CARE_PROVIDER_SITE_OTHER): Payer: Medicare Other | Admitting: Internal Medicine

## 2016-07-11 ENCOUNTER — Other Ambulatory Visit (INDEPENDENT_AMBULATORY_CARE_PROVIDER_SITE_OTHER): Payer: Medicare Other

## 2016-07-11 VITALS — BP 114/82 | HR 61 | Ht 66.0 in | Wt 241.0 lb

## 2016-07-11 DIAGNOSIS — I83891 Varicose veins of right lower extremities with other complications: Secondary | ICD-10-CM | POA: Diagnosis not present

## 2016-07-11 DIAGNOSIS — R7302 Impaired glucose tolerance (oral): Secondary | ICD-10-CM

## 2016-07-11 DIAGNOSIS — E785 Hyperlipidemia, unspecified: Secondary | ICD-10-CM

## 2016-07-11 DIAGNOSIS — M79671 Pain in right foot: Secondary | ICD-10-CM | POA: Insufficient documentation

## 2016-07-11 DIAGNOSIS — Z0001 Encounter for general adult medical examination with abnormal findings: Secondary | ICD-10-CM

## 2016-07-11 LAB — LIPID PANEL
Cholesterol: 121 mg/dL (ref 0–200)
HDL: 41.7 mg/dL (ref >39.00)
LDL CALC: 61 mg/dL (ref 0–99)
NONHDL: 79.36
Total CHOL/HDL Ratio: 3
Triglycerides: 92 mg/dL (ref 0.0–149.0)
VLDL: 18.4 mg/dL (ref 0.0–40.0)

## 2016-07-11 LAB — HEPATIC FUNCTION PANEL
ALBUMIN: 4.3 g/dL (ref 3.5–5.2)
ALK PHOS: 56 U/L (ref 39–117)
ALT: 18 U/L (ref 0–53)
AST: 18 U/L (ref 0–37)
BILIRUBIN DIRECT: 0.2 mg/dL (ref 0.0–0.3)
BILIRUBIN TOTAL: 1 mg/dL (ref 0.2–1.2)
Total Protein: 6.7 g/dL (ref 6.0–8.3)

## 2016-07-11 LAB — BASIC METABOLIC PANEL
BUN: 20 mg/dL (ref 6–23)
CALCIUM: 9.5 mg/dL (ref 8.4–10.5)
CHLORIDE: 104 meq/L (ref 96–112)
CO2: 31 meq/L (ref 19–32)
Creatinine, Ser: 0.87 mg/dL (ref 0.40–1.50)
GFR: 92.33 mL/min (ref >60.00)
GLUCOSE: 100 mg/dL — AB (ref 70–99)
POTASSIUM: 5.2 meq/L — AB (ref 3.5–5.1)
SODIUM: 140 meq/L (ref 135–145)

## 2016-07-11 LAB — HEMOGLOBIN A1C: HEMOGLOBIN A1C: 5.7 % (ref 4.6–6.5)

## 2016-07-11 NOTE — Patient Instructions (Addendum)
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
## 2016-07-14 NOTE — Assessment & Plan Note (Signed)
/  stable overall by history and exam, recent data reviewed with pt, and pt to continue medical treatment as before,  to f/u any worsening symptoms or concerns Lab Results  Component Value Date   LDLCALC 61 07/11/2016

## 2016-07-14 NOTE — Assessment & Plan Note (Signed)
stable overall by history and exam, recent data reviewed with pt, and pt to continue medical treatment as before,  to f/u any worsening symptoms or concerns Lab Results  Component Value Date   HGBA1C 5.7 07/11/2016

## 2016-07-14 NOTE — Assessment & Plan Note (Signed)
c/w tendonitis, for otc nsaid prn as is improving, to reduce walking for several days, consider f/u sport med if worsens

## 2016-07-14 NOTE — Assessment & Plan Note (Signed)
asympt, declines need for vascular referral at this time

## 2016-08-02 ENCOUNTER — Ambulatory Visit: Payer: Medicare Other | Admitting: Cardiovascular Disease

## 2016-10-07 ENCOUNTER — Emergency Department (HOSPITAL_COMMUNITY)
Admission: EM | Admit: 2016-10-07 | Discharge: 2016-10-07 | Discharge status: Against Medical Advice | Payer: Medicare Other | Attending: Emergency Medicine | Admitting: Emergency Medicine

## 2016-10-07 ENCOUNTER — Ambulatory Visit: Payer: Medicare Other | Admitting: Family Medicine

## 2016-10-07 ENCOUNTER — Encounter (HOSPITAL_COMMUNITY): Payer: Self-pay | Admitting: Emergency Medicine

## 2016-10-07 DIAGNOSIS — Z5321 Procedure and treatment not carried out due to patient leaving prior to being seen by health care provider: Secondary | ICD-10-CM | POA: Diagnosis present

## 2016-10-07 NOTE — ED Notes (Signed)
No answer when called for 2hr vitals recheck

## 2016-10-07 NOTE — ED Notes (Signed)
Pt called for room placement without answer multiple times.

## 2016-10-07 NOTE — ED Triage Notes (Signed)
Pt c/o left posterior elbow swelling first noted today while gardening. No recent injury, skin break. No hx arthritis to affected elbow. Soft swelling over elbow bursa. No erythema or hyperthermia to area.

## 2017-01-14 ENCOUNTER — Encounter: Payer: Self-pay | Admitting: Internal Medicine

## 2017-01-14 ENCOUNTER — Ambulatory Visit: Payer: Medicare Other | Admitting: Internal Medicine

## 2017-01-14 ENCOUNTER — Other Ambulatory Visit (INDEPENDENT_AMBULATORY_CARE_PROVIDER_SITE_OTHER): Payer: Medicare Other

## 2017-01-14 ENCOUNTER — Encounter: Payer: Self-pay | Admitting: Cardiology

## 2017-01-14 VITALS — BP 126/86 | HR 73 | Temp 98.3°F | Ht 66.0 in | Wt 250.0 lb

## 2017-01-14 DIAGNOSIS — R109 Unspecified abdominal pain: Secondary | ICD-10-CM | POA: Diagnosis not present

## 2017-01-14 DIAGNOSIS — Z0000 Encounter for general adult medical examination without abnormal findings: Secondary | ICD-10-CM | POA: Diagnosis not present

## 2017-01-14 DIAGNOSIS — R7302 Impaired glucose tolerance (oral): Secondary | ICD-10-CM | POA: Diagnosis not present

## 2017-01-14 DIAGNOSIS — Z23 Encounter for immunization: Secondary | ICD-10-CM

## 2017-01-14 LAB — URINALYSIS, ROUTINE W REFLEX MICROSCOPIC
Bilirubin Urine: NEGATIVE
Hgb urine dipstick: NEGATIVE
Ketones, ur: NEGATIVE
Leukocytes, UA: NEGATIVE
Nitrite: NEGATIVE
PH: 6.5 (ref 5.0–8.0)
SPECIFIC GRAVITY, URINE: 1.025 (ref 1.000–1.030)
TOTAL PROTEIN, URINE-UPE24: NEGATIVE
UROBILINOGEN UA: 0.2 (ref 0.0–1.0)
Urine Glucose: NEGATIVE

## 2017-01-14 LAB — CBC WITH DIFFERENTIAL/PLATELET
BASOS PCT: 0.7 % (ref 0.0–3.0)
Basophils Absolute: 0.1 10*3/uL (ref 0.0–0.1)
EOS ABS: 0.2 10*3/uL (ref 0.0–0.7)
Eosinophils Relative: 2.9 % (ref 0.0–5.0)
HCT: 49.3 % (ref 39.0–52.0)
HEMOGLOBIN: 16.4 g/dL (ref 13.0–17.0)
Lymphocytes Relative: 20.7 % (ref 12.0–46.0)
Lymphs Abs: 1.6 10*3/uL (ref 0.7–4.0)
MCHC: 33.4 g/dL (ref 30.0–36.0)
MCV: 92.6 fl (ref 78.0–100.0)
MONO ABS: 1 10*3/uL (ref 0.1–1.0)
Monocytes Relative: 13.1 % — ABNORMAL HIGH (ref 3.0–12.0)
Neutro Abs: 4.9 10*3/uL (ref 1.4–7.7)
Neutrophils Relative %: 62.6 % (ref 43.0–77.0)
Platelets: 178 10*3/uL (ref 150.0–400.0)
RBC: 5.32 Mil/uL (ref 4.22–5.81)
RDW: 13.5 % (ref 11.5–15.5)
WBC: 7.9 10*3/uL (ref 4.0–10.5)

## 2017-01-14 LAB — BASIC METABOLIC PANEL
BUN: 17 mg/dL (ref 6–23)
CALCIUM: 9.7 mg/dL (ref 8.4–10.5)
CO2: 30 mEq/L (ref 19–32)
CREATININE: 0.81 mg/dL (ref 0.40–1.50)
Chloride: 106 mEq/L (ref 96–112)
GFR: 100.11 mL/min (ref >60.00)
Glucose, Bld: 101 mg/dL — ABNORMAL HIGH (ref 70–99)
Potassium: 5 mEq/L (ref 3.5–5.1)
Sodium: 144 mEq/L (ref 135–145)

## 2017-01-14 LAB — HEPATIC FUNCTION PANEL
ALT: 19 U/L (ref 0–53)
AST: 19 U/L (ref 0–37)
Albumin: 4 g/dL (ref 3.5–5.2)
Alkaline Phosphatase: 53 U/L (ref 39–117)
BILIRUBIN DIRECT: 0.2 mg/dL (ref 0.0–0.3)
BILIRUBIN TOTAL: 0.8 mg/dL (ref 0.2–1.2)
Total Protein: 6.4 g/dL (ref 6.0–8.3)

## 2017-01-14 LAB — LIPID PANEL
CHOL/HDL RATIO: 3
Cholesterol: 129 mg/dL (ref 0–200)
HDL: 43.3 mg/dL (ref >39.00)
LDL CALC: 65 mg/dL (ref 0–99)
NonHDL: 85.22
TRIGLYCERIDES: 99 mg/dL (ref 0.0–149.0)
VLDL: 19.8 mg/dL (ref 0.0–40.0)

## 2017-01-14 LAB — TSH: TSH: 1.62 u[IU]/mL (ref 0.35–4.50)

## 2017-01-14 LAB — PSA: PSA: 1.77 ng/mL (ref 0.10–4.00)

## 2017-01-14 LAB — HEMOGLOBIN A1C: HEMOGLOBIN A1C: 5.9 % (ref 4.6–6.5)

## 2017-01-14 MED ORDER — FLUTICASONE PROPIONATE 50 MCG/ACT NA SUSP: 1.0000 | Freq: Every day | NASAL | 2 refills | Status: DC

## 2017-01-14 MED ORDER — ROSUVASTATIN CALCIUM 10 MG PO TABS: 10.0000 mg | ORAL_TABLET | Freq: Every day | ORAL | 3 refills | Status: DC

## 2017-01-14 MED ORDER — DOXYCYCLINE MONOHYDRATE 100 MG PO CAPS: 100.0000 mg | ORAL_CAPSULE | Freq: Two times a day (BID) | ORAL | 1 refills | Status: DC
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
## 2017-01-14 NOTE — Assessment & Plan Note (Signed)
Has hx of renal stones, pain and urine color only lasted 1 day, not sure if has had cysto previous, will check ua and if blood I would suggest f/u with Dr Wrenn/urology

## 2017-01-14 NOTE — Patient Instructions (Addendum)

## 2017-01-16 ENCOUNTER — Encounter: Payer: Self-pay | Admitting: Cardiology

## 2017-01-16 ENCOUNTER — Ambulatory Visit: Payer: Medicare Other | Admitting: Cardiology

## 2017-01-16 VITALS — BP 128/88 | HR 90 | Ht 66.0 in | Wt 249.6 lb

## 2017-01-16 DIAGNOSIS — G4733 Obstructive sleep apnea (adult) (pediatric): Secondary | ICD-10-CM

## 2017-01-16 NOTE — Patient Instructions (Signed)
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
## 2017-01-18 ENCOUNTER — Encounter: Payer: Self-pay | Admitting: Internal Medicine

## 2017-01-18 NOTE — Assessment & Plan Note (Signed)

## 2017-01-18 NOTE — Assessment & Plan Note (Signed)
stable overall by history and exam, recent data reviewed with pt, and pt to continue medical treatment as before,  to f/u any worsening symptoms or concerns Lab Results  Component Value Date   HGBA1C 5.9 01/14/2017
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
## 2017-07-04 ENCOUNTER — Ambulatory Visit: Payer: Medicare Other | Admitting: Cardiovascular Disease

## 2017-07-04 ENCOUNTER — Encounter: Payer: Self-pay | Admitting: Cardiovascular Disease

## 2017-07-04 VITALS — BP 138/86 | HR 70 | Ht 66.0 in | Wt 249.5 lb

## 2017-07-04 DIAGNOSIS — R0600 Dyspnea, unspecified: Secondary | ICD-10-CM

## 2017-07-04 NOTE — Patient Instructions (Addendum)
Medication Instructions:  Your physician recommends that you continue on your current medications as directed. Please refer to the Current Medication list given to you today.  Labwork: NONE  Testing/Procedures: Your physician has requested that you have an echocardiogram. Echocardiography is a painless test that uses sound waves to create images of your heart. It provides your doctor with information about the size and shape of your heart and how well your heart's chambers and valves are working. This procedure takes approximately one hour. There are no restrictions for this procedure.  Your physician has requested that you have a lexiscan myoview. For further information please visit [email or web address]. Please follow instruction sheet, as given.  Follow-Up: Your physician wants you to follow-up in: 12 months with Dr. Nishan. You will receive a reminder letter in the mail two months in advance. If you don't receive a letter, please call our office to schedule the follow-up appointment.   If you need a refill on your cardiac medications before your next appointment, please call your pharmacy.    

## 2017-07-08 ENCOUNTER — Other Ambulatory Visit (INDEPENDENT_AMBULATORY_CARE_PROVIDER_SITE_OTHER): Payer: Medicare Other

## 2017-07-08 ENCOUNTER — Telehealth (HOSPITAL_COMMUNITY): Payer: Self-pay | Admitting: *Deleted

## 2017-07-08 DIAGNOSIS — R7302 Impaired glucose tolerance (oral): Secondary | ICD-10-CM | POA: Diagnosis not present

## 2017-07-08 LAB — BASIC METABOLIC PANEL
BUN: 18 mg/dL (ref 6–23)
CALCIUM: 9.2 mg/dL (ref 8.4–10.5)
CO2: 28 mEq/L (ref 19–32)
CREATININE: 0.79 mg/dL (ref 0.40–1.50)
Chloride: 105 mEq/L (ref 96–112)
GFR: 102.9 mL/min (ref >60.00)
Glucose, Bld: 97 mg/dL (ref 70–99)
POTASSIUM: 4.5 meq/L (ref 3.5–5.1)
SODIUM: 141 meq/L (ref 135–145)

## 2017-07-08 LAB — LIPID PANEL
CHOLESTEROL: 127 mg/dL (ref 0–200)
HDL: 35 mg/dL — ABNORMAL LOW (ref >39.00)
LDL Cholesterol: 61 mg/dL (ref 0–99)
NonHDL: 91.69
Total CHOL/HDL Ratio: 4
Triglycerides: 153 mg/dL — ABNORMAL HIGH (ref 0.0–149.0)
VLDL: 30.6 mg/dL (ref 0.0–40.0)

## 2017-07-08 LAB — HEPATIC FUNCTION PANEL
ALT: 18 U/L (ref 0–53)
AST: 21 U/L (ref 0–37)
Albumin: 4 g/dL (ref 3.5–5.2)
Alkaline Phosphatase: 65 U/L (ref 39–117)
Bilirubin, Direct: 0.1 mg/dL (ref 0.0–0.3)
TOTAL PROTEIN: 6.3 g/dL (ref 6.0–8.3)
Total Bilirubin: 0.7 mg/dL (ref 0.2–1.2)

## 2017-07-08 LAB — HEMOGLOBIN A1C: HEMOGLOBIN A1C: 5.9 % (ref 4.6–6.5)

## 2017-07-08 NOTE — Telephone Encounter (Signed)
Left message on voicemail per DPR in reference to upcoming appointment scheduled on 07/10/17 with detailed instructions given per Myocardial Perfusion Study Information Sheet for the test. LM to arrive 15 minutes early, and that it is imperative to arrive on time for appointment to keep from having the test rescheduled. If you need to cancel or reschedule your appointment, please call the office within 24 hours of your appointment. Failure to do so may result in a cancellation of your appointment, and a $50 no show fee. Phone number given for call back for any questions. Kirstie Peri

## 2017-07-10 ENCOUNTER — Ambulatory Visit (HOSPITAL_COMMUNITY): Payer: Medicare Other | Attending: Internal Medicine

## 2017-07-10 ENCOUNTER — Ambulatory Visit (HOSPITAL_BASED_OUTPATIENT_CLINIC_OR_DEPARTMENT_OTHER): Payer: Medicare Other

## 2017-07-10 ENCOUNTER — Other Ambulatory Visit: Payer: Self-pay

## 2017-07-10 DIAGNOSIS — E119 Type 2 diabetes mellitus without complications: Secondary | ICD-10-CM | POA: Diagnosis not present

## 2017-07-10 DIAGNOSIS — I4892 Unspecified atrial flutter: Secondary | ICD-10-CM | POA: Insufficient documentation

## 2017-07-10 DIAGNOSIS — R0600 Dyspnea, unspecified: Secondary | ICD-10-CM

## 2017-07-10 DIAGNOSIS — I351 Nonrheumatic aortic (valve) insufficiency: Secondary | ICD-10-CM | POA: Diagnosis not present

## 2017-07-10 DIAGNOSIS — Z8249 Family history of ischemic heart disease and other diseases of the circulatory system: Secondary | ICD-10-CM | POA: Diagnosis not present

## 2017-07-10 DIAGNOSIS — G4733 Obstructive sleep apnea (adult) (pediatric): Secondary | ICD-10-CM | POA: Insufficient documentation

## 2017-07-10 DIAGNOSIS — I4891 Unspecified atrial fibrillation: Secondary | ICD-10-CM | POA: Diagnosis not present

## 2017-07-10 DIAGNOSIS — E785 Hyperlipidemia, unspecified: Secondary | ICD-10-CM | POA: Insufficient documentation

## 2017-07-10 LAB — MYOCARDIAL PERFUSION IMAGING
CHL CUP NUCLEAR SDS: 4
CHL CUP RESTING HR STRESS: 62 {beats}/min
LHR: 0.42
LVDIAVOL: 108 mL (ref 62–150)
LVSYSVOL: 50 mL
Peak HR: 90 {beats}/min
SRS: 1
SSS: 5
TID: 1.12

## 2017-07-10 LAB — ECHOCARDIOGRAM COMPLETE
Height: 66 in
Weight: 3984 oz

## 2017-07-10 MED ORDER — TECHNETIUM TC 99M TETROFOSMIN IV KIT
31.4000 | PACK | Freq: Once | INTRAVENOUS | Status: AC | PRN
  Administered 2017-07-10: 31.4 via INTRAVENOUS
  Filled 2017-07-10: qty 32

## 2017-07-10 MED ORDER — REGADENOSON 0.4 MG/5ML IV SOLN
0.4000 mg | Freq: Once | INTRAVENOUS | Status: AC
  Administered 2017-07-10: 0.4 mg via INTRAVENOUS

## 2017-07-10 MED ORDER — TECHNETIUM TC 99M TETROFOSMIN IV KIT
10.4000 | PACK | Freq: Once | INTRAVENOUS | Status: AC | PRN
  Administered 2017-07-10: 10.4 via INTRAVENOUS
  Filled 2017-07-10: qty 11

## 2017-07-15 ENCOUNTER — Ambulatory Visit: Payer: Medicare Other | Admitting: Internal Medicine

## 2017-07-15 ENCOUNTER — Encounter: Payer: Self-pay | Admitting: Internal Medicine

## 2017-07-15 VITALS — BP 128/86 | HR 72 | Temp 98.1°F | Ht 66.0 in | Wt 250.0 lb

## 2017-07-15 DIAGNOSIS — Z0000 Encounter for general adult medical examination without abnormal findings: Secondary | ICD-10-CM

## 2017-07-15 DIAGNOSIS — E785 Hyperlipidemia, unspecified: Secondary | ICD-10-CM

## 2017-07-15 DIAGNOSIS — J309 Allergic rhinitis, unspecified: Secondary | ICD-10-CM

## 2017-07-15 DIAGNOSIS — R7302 Impaired glucose tolerance (oral): Secondary | ICD-10-CM | POA: Diagnosis not present

## 2017-07-15 MED ORDER — ZOSTER VAC RECOMB ADJUVANTED 50 MCG/0.5ML IM SUSR: 0.5000 mL | Freq: Once | INTRAMUSCULAR | 1 refills | Status: AC
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
## 2017-07-15 NOTE — Assessment & Plan Note (Signed)
stable overall by history and exam, recent data reviewed with pt, and pt to continue medical treatment as before,  to f/u any worsening symptoms or concerns Lab Results  Component Value Date   HGBA1C 5.9 07/08/2017

## 2017-07-15 NOTE — Assessment & Plan Note (Signed)
stable overall by history and exam, recent data reviewed with pt, and pt to continue medical treatment as before,  to f/u any worsening symptoms or concerns Lab Results  Component Value Date   LDLCALC 61 07/08/2017

## 2017-07-15 NOTE — Patient Instructions (Addendum)
Your shingles shot prescription was sent to the pharmacy  OK to try a right wrist splint at night only to see if this would help the numbness to the right 5th finger  Please continue all other medications as before, and refills have been done if requested.  Please have the pharmacy call with any other refills you may need.  Please continue your efforts at being more active, low cholesterol diet, and weight control.  Please keep your appointments with your specialists as you may have planned  Please return in 6 months, or sooner if needed, with Lab testing done 3-5 days before

## 2017-07-15 NOTE — Assessment & Plan Note (Signed)
Ok to restart the flonase

## 2017-12-27 ENCOUNTER — Other Ambulatory Visit: Payer: Self-pay | Admitting: Internal Medicine

## 2017-12-27 ENCOUNTER — Inpatient Hospital Stay (HOSPITAL_COMMUNITY)
Admission: EM | Admit: 2017-12-27 | Discharge: 2017-12-31 | DRG: 287 | Disposition: A | Discharge status: Home / Self Care | Payer: Medicare Other | Attending: Cardiovascular Disease | Admitting: Cardiovascular Disease

## 2017-12-27 ENCOUNTER — Encounter (HOSPITAL_COMMUNITY): Payer: Self-pay | Admitting: Emergency Medicine

## 2017-12-27 ENCOUNTER — Other Ambulatory Visit: Payer: Self-pay

## 2017-12-27 DIAGNOSIS — E785 Hyperlipidemia, unspecified: Secondary | ICD-10-CM | POA: Diagnosis present

## 2017-12-27 DIAGNOSIS — I214 Non-ST elevation (NSTEMI) myocardial infarction: Secondary | ICD-10-CM

## 2017-12-27 DIAGNOSIS — I484 Atypical atrial flutter: Principal | ICD-10-CM | POA: Diagnosis present

## 2017-12-27 DIAGNOSIS — G4733 Obstructive sleep apnea (adult) (pediatric): Secondary | ICD-10-CM | POA: Diagnosis present

## 2017-12-27 DIAGNOSIS — E669 Obesity, unspecified: Secondary | ICD-10-CM | POA: Diagnosis present

## 2017-12-27 DIAGNOSIS — E119 Type 2 diabetes mellitus without complications: Secondary | ICD-10-CM | POA: Diagnosis present

## 2017-12-27 DIAGNOSIS — R9431 Abnormal electrocardiogram [ECG] [EKG]: Secondary | ICD-10-CM | POA: Diagnosis present

## 2017-12-27 DIAGNOSIS — Z6841 Body Mass Index (BMI) 40.0 and over, adult: Secondary | ICD-10-CM

## 2017-12-27 DIAGNOSIS — I4892 Unspecified atrial flutter: Secondary | ICD-10-CM | POA: Diagnosis present

## 2017-12-27 DIAGNOSIS — I2510 Atherosclerotic heart disease of native coronary artery without angina pectoris: Secondary | ICD-10-CM | POA: Diagnosis present
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
## 2017-12-28 ENCOUNTER — Emergency Department (HOSPITAL_COMMUNITY): Payer: Medicare Other

## 2017-12-28 ENCOUNTER — Other Ambulatory Visit: Payer: Self-pay

## 2017-12-28 DIAGNOSIS — I471 Supraventricular tachycardia: Secondary | ICD-10-CM

## 2017-12-28 DIAGNOSIS — R7989 Other specified abnormal findings of blood chemistry: Secondary | ICD-10-CM | POA: Diagnosis not present

## 2017-12-28 DIAGNOSIS — I214 Non-ST elevation (NSTEMI) myocardial infarction: Secondary | ICD-10-CM | POA: Diagnosis present

## 2017-12-28 DIAGNOSIS — R002 Palpitations: Secondary | ICD-10-CM

## 2017-12-28 DIAGNOSIS — I2510 Atherosclerotic heart disease of native coronary artery without angina pectoris: Secondary | ICD-10-CM | POA: Diagnosis present

## 2017-12-28 DIAGNOSIS — E119 Type 2 diabetes mellitus without complications: Secondary | ICD-10-CM | POA: Diagnosis present

## 2017-12-28 DIAGNOSIS — I483 Typical atrial flutter: Secondary | ICD-10-CM | POA: Diagnosis not present

## 2017-12-28 DIAGNOSIS — G4733 Obstructive sleep apnea (adult) (pediatric): Secondary | ICD-10-CM | POA: Diagnosis present

## 2017-12-28 DIAGNOSIS — Z6841 Body Mass Index (BMI) 40.0 and over, adult: Secondary | ICD-10-CM | POA: Diagnosis not present

## 2017-12-28 DIAGNOSIS — E669 Obesity, unspecified: Secondary | ICD-10-CM | POA: Diagnosis present

## 2017-12-28 DIAGNOSIS — R748 Abnormal levels of other serum enzymes: Secondary | ICD-10-CM

## 2017-12-28 DIAGNOSIS — E785 Hyperlipidemia, unspecified: Secondary | ICD-10-CM | POA: Diagnosis present

## 2017-12-28 DIAGNOSIS — R9431 Abnormal electrocardiogram [ECG] [EKG]: Secondary | ICD-10-CM | POA: Diagnosis present

## 2017-12-28 DIAGNOSIS — I484 Atypical atrial flutter: Secondary | ICD-10-CM | POA: Diagnosis present

## 2017-12-28 LAB — CBC
HCT: 48 % (ref 39.0–52.0)
HEMATOCRIT: 50.1 % (ref 39.0–52.0)
Hemoglobin: 15.4 g/dL (ref 13.0–17.0)
Hemoglobin: 16.3 g/dL (ref 13.0–17.0)
MCH: 29.4 pg (ref 26.0–34.0)
MCH: 29.7 pg (ref 26.0–34.0)
MCHC: 32.1 g/dL (ref 30.0–36.0)
MCHC: 32.5 g/dL (ref 30.0–36.0)
MCV: 91.4 fL (ref 80.0–100.0)
MCV: 91.8 fL (ref 80.0–100.0)
NRBC: 0 % (ref 0.0–0.2)
PLATELETS: 187 10*3/uL (ref 150–400)
Platelets: 199 10*3/uL (ref 150–400)
RBC: 5.23 MIL/uL (ref 4.22–5.81)
RBC: 5.48 MIL/uL (ref 4.22–5.81)
RDW: 12.9 % (ref 11.5–15.5)
RDW: 13 % (ref 11.5–15.5)
WBC: 7.6 10*3/uL (ref 4.0–10.5)
WBC: 7.8 10*3/uL (ref 4.0–10.5)
nRBC: 0 % (ref 0.0–0.2)

## 2017-12-28 LAB — BASIC METABOLIC PANEL
ANION GAP: 13 (ref 5–15)
ANION GAP: 7 (ref 5–15)
Anion gap: 8 (ref 5–15)
BUN: 18 mg/dL (ref 8–23)
BUN: 19 mg/dL (ref 8–23)
BUN: 19 mg/dL (ref 8–23)
CHLORIDE: 105 mmol/L (ref 98–111)
CHLORIDE: 107 mmol/L (ref 98–111)
CO2: 23 mmol/L (ref 22–32)
CO2: 24 mmol/L (ref 22–32)
CO2: 25 mmol/L (ref 22–32)
CREATININE: 0.95 mg/dL (ref 0.61–1.24)
Calcium: 8.8 mg/dL — ABNORMAL LOW (ref 8.9–10.3)
Calcium: 9.1 mg/dL (ref 8.9–10.3)
Calcium: 9.6 mg/dL (ref 8.9–10.3)
Chloride: 110 mmol/L (ref 98–111)
Creatinine, Ser: 0.82 mg/dL (ref 0.61–1.24)
Creatinine, Ser: 0.89 mg/dL (ref 0.61–1.24)
GFR calc Af Amer: >60 mL/min (ref >60)
GFR calc Af Amer: >60 mL/min (ref >60)
GFR calc non Af Amer: >60 mL/min (ref >60)
GFR calc non Af Amer: >60 mL/min (ref >60)
GLUCOSE: 151 mg/dL — AB (ref 70–99)
Glucose, Bld: 102 mg/dL — ABNORMAL HIGH (ref 70–99)
Glucose, Bld: 124 mg/dL — ABNORMAL HIGH (ref 70–99)
POTASSIUM: 4.2 mmol/L (ref 3.5–5.1)
Potassium: 3.9 mmol/L (ref 3.5–5.1)
Potassium: 6 mmol/L — ABNORMAL HIGH (ref 3.5–5.1)
SODIUM: 140 mmol/L (ref 135–145)
SODIUM: 141 mmol/L (ref 135–145)
Sodium: 141 mmol/L (ref 135–145)

## 2017-12-28 LAB — I-STAT TROPONIN, ED: Troponin i, poc: 0.1 ng/mL (ref 0.00–0.08)

## 2017-12-28 LAB — TROPONIN I: Troponin I: 0.66 ng/mL (ref <0.03)

## 2017-12-28 LAB — HEPARIN LEVEL (UNFRACTIONATED): Heparin Unfractionated: 0.31 IU/mL (ref 0.30–0.70)

## 2017-12-28 MED ORDER — ASPIRIN EC 81 MG PO TBEC
81.0000 mg | DELAYED_RELEASE_TABLET | Freq: Every day | ORAL | Status: DC
  Administered 2017-12-28: 81 mg via ORAL
  Filled 2017-12-28: qty 1

## 2017-12-28 MED ORDER — SODIUM CHLORIDE 0.9 % IV BOLUS
500.0000 mL | Freq: Once | INTRAVENOUS | Status: AC
  Administered 2017-12-28: 500 mL via INTRAVENOUS

## 2017-12-28 MED ORDER — ACETAMINOPHEN 325 MG PO TABS: 650.0000 mg | ORAL_TABLET | ORAL | Status: DC | PRN

## 2017-12-28 MED ORDER — HEPARIN BOLUS VIA INFUSION
4500.0000 [IU] | Freq: Once | INTRAVENOUS | Status: AC
  Administered 2017-12-28: 4500 [IU] via INTRAVENOUS
  Filled 2017-12-28: qty 4500

## 2017-12-28 MED ORDER — METOPROLOL TARTRATE 25 MG PO TABS
25.0000 mg | ORAL_TABLET | Freq: Two times a day (BID) | ORAL | Status: DC
  Administered 2017-12-28 – 2017-12-31 (×6): 25 mg via ORAL
  Filled 2017-12-28 (×7): qty 1

## 2017-12-28 MED ORDER — ONDANSETRON HCL 4 MG/2ML IJ SOLN: 4.0000 mg | Freq: Four times a day (QID) | INTRAMUSCULAR | Status: DC | PRN

## 2017-12-28 MED ORDER — HEPARIN (PORCINE) IN NACL 100-0.45 UNIT/ML-% IJ SOLN
1400.0000 [IU]/h | INTRAMUSCULAR | Status: DC
  Administered 2017-12-28: 1200 [IU]/h via INTRAVENOUS
  Filled 2017-12-28 (×2): qty 250

## 2017-12-28 MED ORDER — ROSUVASTATIN CALCIUM 10 MG PO TABS
10.0000 mg | ORAL_TABLET | Freq: Every day | ORAL | Status: DC
  Administered 2017-12-28 – 2017-12-30 (×3): 10 mg via ORAL
  Filled 2017-12-28 (×4): qty 1

## 2017-12-28 MED ORDER — APIXABAN 5 MG PO TABS: 5.0000 mg | ORAL_TABLET | Freq: Two times a day (BID) | ORAL | Status: DC

## 2017-12-28 MED ORDER — APIXABAN 5 MG PO TABS
5.0000 mg | ORAL_TABLET | Freq: Two times a day (BID) | ORAL | Status: DC
  Administered 2017-12-28: 5 mg via ORAL
  Filled 2017-12-28: qty 1

## 2017-12-28 MED ORDER — LACTATED RINGERS IV BOLUS: 500.0000 mL | Freq: Once | INTRAVENOUS | Status: DC

## 2017-12-28 NOTE — ED Notes (Signed)
Ordered lunch diet tray from [facility]
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
## 2017-12-28 NOTE — ED Notes (Signed)
Pt voided 679ml of urine this morning.

## 2017-12-28 NOTE — Progress Notes (Signed)
Progress Note  Patient Name: Eric Santos Date of Encounter: 12/28/2017  Primary Cardiologist: Johnsie Cancel  Subjective   No chest pain or sob. He does not feel palpitations.  Inpatient Medications    Scheduled Meds: . aspirin EC  81 mg Oral Daily  . metoprolol tartrate  25 mg Oral BID  . rosuvastatin  10 mg Oral q1800   Continuous Infusions: . heparin 1,400 Units/hr (12/28/17 1401)   PRN Meds: acetaminophen, ondansetron (ZOFRAN) IV   Vital Signs    Vitals:   12/28/17 1230 12/28/17 1300 12/28/17 1330 12/28/17 1400  BP: 105/85 110/87 101/82 (!) 115/94  Pulse: (!) 115 (!) 114 (!) 113 (!) 113  Resp: 19 19 19 18   Temp:      TempSrc:      SpO2: 96% 97% 97% 96%  Weight:      Height:        Intake/Output Summary (Last 24 hours) at 12/28/2017 1403 Last data filed at 12/28/2017 7124 Gross per 24 hour  Intake 500 ml  Output -  Net 500 ml   Filed Weights   12/27/17 2338  Weight: 115.7 kg    Telemetry    Atrial tachy with 2:1 AV conduction - Personally Reviewed  ECG    Atrial tachy with 2:1 AV conduction - Personally Reviewed  Physical Exam   GEN: No acute distress.   Neck: 7 cm JVD Cardiac: Reg tachy, no murmurs, rubs, or gallops.  Respiratory: Clear to auscultation bilaterally. GI: Soft, nontender, non-distended  MS: No edema; No deformity. Neuro:  Nonfocal  Psych: Normal affect   Labs    Chemistry Recent Labs  Lab 12/27/17 2347 12/28/17 0533  NA 140 141  K 3.9 6.0*  CL 107 105  CO2 25 23  GLUCOSE 151* 102*  BUN 19 18  CREATININE 0.95 0.82  CALCIUM 9.1 9.6  GFRNONAA >60 >60  GFRAA >60 >60  ANIONGAP 8 13     Hematology Recent Labs  Lab 12/27/17 2347 12/28/17 0533  WBC 7.6 7.8  RBC 5.48 5.23  HGB 16.3 15.4  HCT 50.1 48.0  MCV 91.4 91.8  MCH 29.7 29.4  MCHC 32.5 32.1  RDW 12.9 13.0  PLT 199 187    Cardiac Enzymes Recent Labs  Lab 12/28/17 0533  TROPONINI 0.66*    Recent Labs  Lab 12/27/17 2352  TROPIPOC 0.10*      BNPNo results for input(s): BNP, PROBNP in the last 168 hours.   DDimer No results for input(s): DDIMER in the last 168 hours.   Radiology    Dg Chest 2 View  Result Date: 12/28/2017 CLINICAL DATA:  Tachycardia with chest pain and shortness of breath EXAM: CHEST - 2 VIEW COMPARISON:  August 05, 2015 FINDINGS: There is no edema or consolidation. Heart is upper normal in size with pulmonary vascularity normal. No adenopathy. There is a focal hiatal hernia. There is degenerative change in the thoracic spine. No pneumothorax. IMPRESSION: No edema or consolidation. Hiatal hernia present. Heart upper normal in size. Electronically Signed   By: Lowella Grip III M.D.   On: 12/28/2017 00:55    Cardiac Studies   none  Patient Profile     72 y.o. male admitted with symptomatic atrial tachy with a RVR.   Assessment & Plan    1. Atrial tachy/flutter - his rhythm is not typical like he had prior to his ablation. I suspect a different circuit. We will transition him to Eliquis, plan for a TEE/DCCV tomorrow.  2. Sleep apnea - he has used his bipap for 4-5 years and his wife notes that he is compliant.  3. Obesity - he needs to lose weight. 4. Dyspnea - he has been worked up by Dr. Johnsie Cancel in the past.  For questions or updates, please contact [place] Please consult [email or web address] for contact info under Cardiology/STEMI.      Signed, Cristopher Peru, MD  12/28/2017, 2:03 PM  Patient ID: Eric Santos, male   DOB: [birth year], 72 y.o.   MRN: [id number]
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
## 2017-12-28 NOTE — Progress Notes (Signed)
Pt HR 110's atrial flutter, cardioloigist aware metoprolol po given, plan for cardioversion in am, pt asx, denies cp or sob, pt alert and oriented, wife at bedside

## 2017-12-29 DIAGNOSIS — I214 Non-ST elevation (NSTEMI) myocardial infarction: Secondary | ICD-10-CM

## 2017-12-29 DIAGNOSIS — I483 Typical atrial flutter: Secondary | ICD-10-CM

## 2017-12-29 LAB — HEPARIN LEVEL (UNFRACTIONATED): Heparin Unfractionated: 0.51 IU/mL (ref 0.30–0.70)

## 2017-12-29 LAB — CBC
HCT: 50.3 % (ref 39.0–52.0)
Hemoglobin: 16.3 g/dL (ref 13.0–17.0)
MCH: 29.4 pg (ref 26.0–34.0)
MCHC: 32.4 g/dL (ref 30.0–36.0)
MCV: 90.8 fL (ref 80.0–100.0)
NRBC: 0 % (ref 0.0–0.2)
Platelets: 213 10*3/uL (ref 150–400)
RBC: 5.54 MIL/uL (ref 4.22–5.81)
RDW: 13 % (ref 11.5–15.5)
WBC: 8.9 10*3/uL (ref 4.0–10.5)

## 2017-12-29 LAB — MAGNESIUM: Magnesium: 1.9 mg/dL (ref 1.7–2.4)

## 2017-12-29 LAB — APTT: aPTT: 64 seconds — ABNORMAL HIGH (ref 24–36)

## 2017-12-29 LAB — BASIC METABOLIC PANEL
Anion gap: 6 (ref 5–15)
BUN: 17 mg/dL (ref 8–23)
CALCIUM: 8.9 mg/dL (ref 8.9–10.3)
CHLORIDE: 110 mmol/L (ref 98–111)
CO2: 25 mmol/L (ref 22–32)
CREATININE: 0.95 mg/dL (ref 0.61–1.24)
GFR calc Af Amer: >60 mL/min (ref >60)
GFR calc non Af Amer: >60 mL/min (ref >60)
GLUCOSE: 135 mg/dL — AB (ref 70–99)
Potassium: 4.3 mmol/L (ref 3.5–5.1)
Sodium: 141 mmol/L (ref 135–145)

## 2017-12-29 MED ORDER — SODIUM CHLORIDE 0.9 % WEIGHT BASED INFUSION
1.0000 mL/kg/h | INTRAVENOUS | Status: DC
  Administered 2017-12-30 (×2): 1 mL/kg/h via INTRAVENOUS

## 2017-12-29 MED ORDER — SODIUM CHLORIDE 0.9 % IV SOLN
INTRAVENOUS | Status: DC
  Administered 2017-12-29: 06:00:00 via INTRAVENOUS

## 2017-12-29 MED ORDER — HEPARIN (PORCINE) IN NACL 100-0.45 UNIT/ML-% IJ SOLN
1500.0000 [IU]/h | INTRAMUSCULAR | Status: DC
  Administered 2017-12-29: 1400 [IU]/h via INTRAVENOUS
  Administered 2017-12-30: 1500 [IU]/h via INTRAVENOUS
  Filled 2017-12-29 (×2): qty 250

## 2017-12-29 MED ORDER — SODIUM CHLORIDE 0.9 % WEIGHT BASED INFUSION
3.0000 mL/kg/h | INTRAVENOUS | Status: AC
  Administered 2017-12-30: 3 mL/kg/h via INTRAVENOUS

## 2017-12-29 MED ORDER — ASPIRIN EC 81 MG PO TBEC
81.0000 mg | DELAYED_RELEASE_TABLET | Freq: Every day | ORAL | Status: DC
  Administered 2017-12-29 – 2017-12-30 (×2): 81 mg via ORAL
  Filled 2017-12-29 (×3): qty 1

## 2017-12-29 MED ORDER — MAGNESIUM SULFATE IN D5W 1-5 GM/100ML-% IV SOLN
1.0000 g | Freq: Once | INTRAVENOUS | Status: AC
  Administered 2017-12-29: 1 g via INTRAVENOUS
  Filled 2017-12-29: qty 100

## 2017-12-29 NOTE — Plan of Care (Signed)
  Problem: Nutrition: Goal: Adequate nutrition will be maintained Outcome: Completed/Met   Problem: Coping: Goal: Level of anxiety will decrease Outcome: Completed/Met   Problem: Pain Managment: Goal: General experience of comfort will improve Outcome: Completed/Met   Problem: Skin Integrity: Goal: Risk for impaired skin integrity will decrease Outcome: Completed/Met   

## 2017-12-29 NOTE — Progress Notes (Signed)
Pt had a episode of HR going into the 160's while pt in the bathroom started having a BM, HR went into 227 SVT, got pt back to bed and it sustained at 226-227 in SVT still, called rapid response called, BP was low in the 90's still pt's 220's SVT, put O2 at 4L,, did ECG it showed NSR in the 70's, when pt converted to NSR at around 6:07 am, rechecked VS and HR 77, BP 133/95, pt was also starting to feel better as well, will continue to monitor, thanks Buckner [place].

## 2017-12-29 NOTE — Significant Event (Signed)
Rapid Response Event Note  Overview:  Called by Eric Santos for pt with sustained SVT    Initial Focused Assessment: Upon arrival, Eric Santos was lying in the bed, alert and oriented x4.  His face was flushed and he stated he had some chest pressure and SOB.  He recently was trying to have a BM and said it felt like it was happening "again".  His rhythm was SVT 220s.  BP was in the 90s.  Skin was warm, dry and pink with good cap refill. Valsalvo maneuvers were attempted prior to my arrival.  Cardiology notified and awaiting response.  While we were preparing for cardioversion and placing defib pads and obtaining 12 lead EKG, he spontaneously converted back to NSR 80s.  I personally reviewed his telemetry and at 0551 he switched from Afib with RVR to SVT.  His SVT converted to NSR at 0607.  Cardiology paged again. At 0610, HR 77, BP 133/95, RR 18.  Interventions: -Stat 12 lead  Plan of Care (if not transferred): -Notify Cardiology of events -Continue to monitor -Give schedule Metoprolol  Event Summary: -Pt stayed in room and stabilized Call ended 0630  Eric Santos

## 2017-12-29 NOTE — Progress Notes (Signed)
Benefit check in progress for Eliquis; B Roxan Yamamoto RN,MHA,BSN [phone] 

## 2017-12-29 NOTE — Progress Notes (Addendum)
Progress Note  Patient Name: EMMAUS BRANDI Date of Encounter: 12/29/2017  Primary Cardiologist: No primary care provider on file.  Subjective   Had episode of SVT this morning with rates into the 200s. RR called and prepared for DCCV but pt converted spontaneously. Chest pain during this episode, but none at this time.   Inpatient Medications    Scheduled Meds: . apixaban  5 mg Oral BID  . metoprolol tartrate  25 mg Oral BID  . rosuvastatin  10 mg Oral q1800   Continuous Infusions: . sodium chloride 20 mL/hr at 12/29/17 0627   PRN Meds: acetaminophen, ondansetron (ZOFRAN) IV   Vital Signs    Vitals:   12/29/17 0610 12/29/17 0613 12/29/17 0740 12/29/17 0800  BP: (!) 133/95 (!) 112/95 (!) 123/92   Pulse: 77 76 77   Resp:   14   Temp:   97.6 F (36.4 C)   TempSrc:   Oral   SpO2: 100%  97% 96%  Weight:      Height:        Intake/Output Summary (Last 24 hours) at 12/29/2017 0917 Last data filed at 12/29/2017 0300 Gross per 24 hour  Intake 662.56 ml  Output 850 ml  Net -187.44 ml   Filed Weights   12/27/17 2338 12/28/17 1702 12/29/17 0400  Weight: 115.7 kg 118.4 kg 118.2 kg    Telemetry    AFib--> SVT--> SR - Personally Reviewed  ECG    SR with T wave depression in inferolateral leads, and ST elevation in aVR (0609am) - Personally Reviewed  Physical Exam   General: Well developed, well nourished, male appearing in no acute distress. Head: Normocephalic, atraumatic.  Neck: Supple without bruits, JVD. Lungs:  Resp regular and unlabored, CTA. Heart: RRR, S1, S2, no murmur; no rub. Abdomen: Soft, non-tender, non-distended with normoactive bowel sounds.  Extremities: No clubbing, cyanosis, edema. Distal pedal pulses are 2+ bilaterally. Neuro: Alert and oriented X 3. Moves all extremities spontaneously. Psych: Normal affect.  Labs    Chemistry Recent Labs  Lab 12/28/17 0533 12/28/17 2148 12/29/17 0619  NA 141 141 141  K 6.0* 4.2 4.3  CL 105  110 110  CO2 23 24 25   GLUCOSE 102* 124* 135*  BUN 18 19 17   CREATININE 0.82 0.89 0.95  CALCIUM 9.6 8.8* 8.9  GFRNONAA >60 >60 >60  GFRAA >60 >60 >60  ANIONGAP 13 7 6      Hematology Recent Labs  Lab 12/27/17 2347 12/28/17 0533 12/29/17 0619  WBC 7.6 7.8 8.9  RBC 5.48 5.23 5.54  HGB 16.3 15.4 16.3  HCT 50.1 48.0 50.3  MCV 91.4 91.8 90.8  MCH 29.7 29.4 29.4  MCHC 32.5 32.1 32.4  RDW 12.9 13.0 13.0  PLT 199 187 213    Cardiac Enzymes Recent Labs  Lab 12/28/17 0533  TROPONINI 0.66*    Recent Labs  Lab 12/27/17 2352  TROPIPOC 0.10*     BNPNo results for input(s): BNP, PROBNP in the last 168 hours.   DDimer No results for input(s): DDIMER in the last 168 hours.    Radiology    Dg Chest 2 View  Result Date: 12/28/2017 CLINICAL DATA:  Tachycardia with chest pain and shortness of breath EXAM: CHEST - 2 VIEW COMPARISON:  August 05, 2015 FINDINGS: There is no edema or consolidation. Heart is upper normal in size with pulmonary vascularity normal. No adenopathy. There is a focal hiatal hernia. There is degenerative change in the thoracic spine. No pneumothorax.  IMPRESSION: No edema or consolidation. Hiatal hernia present. Heart upper normal in size. Electronically Signed   By: Lowella Grip III M.D.   On: 12/28/2017 00:55    Cardiac Studies     Patient Profile     72 y.o. male with PMH of Aflutter, Afib, HL, OSA and OA who presented with atrial tachycardia with RVR. Planned for TEE/DCCV but developed SVT this morning and then converted into SR.   Assessment & Plan    1. Afib/Flutter: He had been on [facility] in the past but requested to stop after ablation. Since recurrence this admission has been placed back on [place] with Eliquis 5mg  BID. Planned for TEE/DCCV this morning but had episode of SVT with conversion to SR spontaneously. Will check mag level this morning. K+ 4.3.   2. Abnormal EKG/Elevated troponin/: Trop 0.66 on admission. After converting to SR repeat EKG  this morning showed T wave depression in inferolateral leads with ST evaluation in aVR which is new. No chest pain at this time. Denies having had a cath in the past.   -- repeat EKG now shows resolution of EKG changes. Discussed with MD and will plan for cath.  -- hold Eliquis this morning and start IV heparin in anticipation of need for cardiac cath. Have placed on the board for tomorrow, unless recurrence of chest pain.   2. OSA: reports using cpap at home and being compliant.   3. Obesity: Working to lose weight.   4. HL: on statin.   Signed, Reino Bellis, NP  12/29/2017, 9:17 AM  Pager # [phone]   For questions or updates, please contact [place] Please consult [email or web address] for contact info under Cardiology/STEMI.  I have personally seen and examined this patient. I agree with the assessment and plan as outlined above.  He is admitted with atrial tach with RVR. Plans were in place for cardioversion today but he had a run of SVT then converted to sinus this am. Chest pain while tachycardic. His post conversion EKG was very abnormal with diffuse ST depression and elevation in AVR. This normalized on follow up EKG. He has no chest pain currently. Troponin was elevated on admission. This could represent a NSTEMI or demand ischemia from his rapid rates. Cardiac cath is indicated prior to discharge. Will plan for tomorrow. Stop Eliquis today and start IV heparin. NPO at midnight for cath tomorrow.   Lauree Chandler 12/29/2017 11:00 AM

## 2017-12-29 NOTE — Progress Notes (Signed)
RE: Benefit check  Received: Today  Message Contents  Kerin Salen CMA        Per Marcia/ [facility] [phone] estimated copay is $33.60  For 30day supply no prior auth is required for Eliquis and Apixaban is not on formulary
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
## 2017-12-30 ENCOUNTER — Encounter (HOSPITAL_COMMUNITY)
Admission: EM | Disposition: A | Discharge status: Home / Self Care | Payer: Self-pay | Source: Home / Self Care | Attending: Cardiovascular Disease

## 2017-12-30 DIAGNOSIS — I2510 Atherosclerotic heart disease of native coronary artery without angina pectoris: Secondary | ICD-10-CM

## 2017-12-30 HISTORY — PX: LEFT HEART CATH AND CORONARY ANGIOGRAPHY: CATH118249

## 2017-12-30 LAB — HEPARIN LEVEL (UNFRACTIONATED): Heparin Unfractionated: 0.65 IU/mL (ref 0.30–0.70)

## 2017-12-30 LAB — BASIC METABOLIC PANEL
ANION GAP: 7 (ref 5–15)
BUN: 15 mg/dL (ref 8–23)
CO2: 27 mmol/L (ref 22–32)
Calcium: 8.5 mg/dL — ABNORMAL LOW (ref 8.9–10.3)
Chloride: 108 mmol/L (ref 98–111)
Creatinine, Ser: 0.88 mg/dL (ref 0.61–1.24)
GFR calc non Af Amer: >60 mL/min (ref >60)
Glucose, Bld: 110 mg/dL — ABNORMAL HIGH (ref 70–99)
Potassium: 4.5 mmol/L (ref 3.5–5.1)
Sodium: 142 mmol/L (ref 135–145)

## 2017-12-30 LAB — APTT: aPTT: 115 seconds — ABNORMAL HIGH (ref 24–36)

## 2017-12-30 LAB — CBC
HEMATOCRIT: 44.9 % (ref 39.0–52.0)
Hemoglobin: 14.7 g/dL (ref 13.0–17.0)
MCH: 29.9 pg (ref 26.0–34.0)
MCHC: 32.7 g/dL (ref 30.0–36.0)
MCV: 91.4 fL (ref 80.0–100.0)
Platelets: 185 10*3/uL (ref 150–400)
RBC: 4.91 MIL/uL (ref 4.22–5.81)
RDW: 13.2 % (ref 11.5–15.5)
WBC: 8.1 10*3/uL (ref 4.0–10.5)
nRBC: 0 % (ref 0.0–0.2)

## 2017-12-30 LAB — MAGNESIUM: Magnesium: 2.1 mg/dL (ref 1.7–2.4)

## 2017-12-30 SURGERY — LEFT HEART CATH AND CORONARY ANGIOGRAPHY
Anesthesia: LOCAL

## 2017-12-30 MED ORDER — IOHEXOL 350 MG/ML SOLN
INTRAVENOUS | Status: DC | PRN
  Administered 2017-12-30: 65 mL via INTRACARDIAC

## 2017-12-30 MED ORDER — FENTANYL CITRATE (PF) 100 MCG/2ML IJ SOLN
INTRAMUSCULAR | Status: AC
  Filled 2017-12-30: qty 2

## 2017-12-30 MED ORDER — HEPARIN (PORCINE) IN NACL 1000-0.9 UT/500ML-% IV SOLN
INTRAVENOUS | Status: AC
  Filled 2017-12-30: qty 500

## 2017-12-30 MED ORDER — VERAPAMIL HCL 2.5 MG/ML IV SOLN
INTRAVENOUS | Status: DC | PRN
  Administered 2017-12-30: 10 mL via INTRA_ARTERIAL

## 2017-12-30 MED ORDER — MIDAZOLAM HCL 2 MG/2ML IJ SOLN
INTRAMUSCULAR | Status: DC | PRN
  Administered 2017-12-30: 1 mg via INTRAVENOUS

## 2017-12-30 MED ORDER — SODIUM CHLORIDE 0.9 % IV SOLN
INTRAVENOUS | Status: AC
  Administered 2017-12-30: 17:00:00 via INTRAVENOUS

## 2017-12-30 MED ORDER — LIDOCAINE HCL (PF) 1 % IJ SOLN
INTRAMUSCULAR | Status: DC | PRN
  Administered 2017-12-30: 2 mL

## 2017-12-30 MED ORDER — FENTANYL CITRATE (PF) 100 MCG/2ML IJ SOLN
INTRAMUSCULAR | Status: DC | PRN
  Administered 2017-12-30: 25 ug via INTRAVENOUS

## 2017-12-30 MED ORDER — HEPARIN SODIUM (PORCINE) 1000 UNIT/ML IJ SOLN
INTRAMUSCULAR | Status: DC | PRN
  Administered 2017-12-30: 5000 [IU] via INTRAVENOUS

## 2017-12-30 MED ORDER — VERAPAMIL HCL 2.5 MG/ML IV SOLN
INTRAVENOUS | Status: AC
  Filled 2017-12-30: qty 2

## 2017-12-30 MED ORDER — SODIUM CHLORIDE 0.9% FLUSH
3.0000 mL | Freq: Two times a day (BID) | INTRAVENOUS | Status: DC
  Administered 2017-12-30 – 2017-12-31 (×2): 3 mL via INTRAVENOUS

## 2017-12-30 MED ORDER — SODIUM CHLORIDE 0.9 % IV SOLN: 250.0000 mL | INTRAVENOUS | Status: DC | PRN

## 2017-12-30 MED ORDER — MIDAZOLAM HCL 2 MG/2ML IJ SOLN
INTRAMUSCULAR | Status: AC
  Filled 2017-12-30: qty 2

## 2017-12-30 MED ORDER — HEPARIN (PORCINE) IN NACL 1000-0.9 UT/500ML-% IV SOLN
INTRAVENOUS | Status: DC | PRN
  Administered 2017-12-30 (×2): 500 mL

## 2017-12-30 MED ORDER — APIXABAN 5 MG PO TABS
5.0000 mg | ORAL_TABLET | Freq: Two times a day (BID) | ORAL | Status: DC
  Filled 2017-12-30: qty 1

## 2017-12-30 MED ORDER — SODIUM CHLORIDE 0.9% FLUSH: 3.0000 mL | INTRAVENOUS | Status: DC | PRN

## 2017-12-30 SURGICAL SUPPLY — 10 items
CATH 5FR JL3.5 JR4 ANG PIG MP (CATHETERS) ×1 IMPLANT
DEVICE RAD COMP TR BAND LRG (VASCULAR PRODUCTS) ×1 IMPLANT
GLIDESHEATH SLEND SS 6F .021 (SHEATH) ×1 IMPLANT
GUIDEWIRE INQWIRE 1.5J.035X260 (WIRE) IMPLANT
INQWIRE 1.5J .035X260CM (WIRE) ×2
KIT HEART LEFT (KITS) ×2 IMPLANT
PACK CARDIAC CATHETERIZATION (CUSTOM PROCEDURE TRAY) ×2 IMPLANT
SYR MEDRAD MARK V 150ML (SYRINGE) ×2 IMPLANT
TRANSDUCER W/STOPCOCK (MISCELLANEOUS) ×2 IMPLANT
TUBING CIL FLEX 10 FLL-RA (TUBING) ×2 IMPLANT

## 2017-12-30 NOTE — Progress Notes (Addendum)
Progress Note  Patient Name: Eric Santos Date of Encounter: 12/30/2017  Primary Cardiologist: No primary care provider on file.  Subjective   Feeling well this morning. No recurrent palpations.   Inpatient Medications    Scheduled Meds: . aspirin EC  81 mg Oral Daily  . metoprolol tartrate  25 mg Oral BID  . rosuvastatin  10 mg Oral q1800   Continuous Infusions: . sodium chloride Stopped (12/29/17 1000)  . sodium chloride 1 mL/kg/hr (12/30/17 0942)  . heparin 1,500 Units/hr (12/30/17 0331)   PRN Meds: acetaminophen, ondansetron (ZOFRAN) IV   Vital Signs    Vitals:   12/29/17 1203 12/29/17 2121 12/30/17 0507 12/30/17 0940  BP: 106/81 (!) 129/91 (!) 144/95 (!) 125/91  Pulse: 72 70 64 66  Resp: 18 14 16 16   Temp: 98.2 F (36.8 C) (!) 97.4 F (36.3 C) (!) 97.3 F (36.3 C) 98.1 F (36.7 C)  TempSrc: Oral Oral Oral Oral  SpO2: 95% 97% 100% 96%  Weight:   118.1 kg   Height:        Intake/Output Summary (Last 24 hours) at 12/30/2017 0955 Last data filed at 12/30/2017 8916 Gross per 24 hour  Intake 1967.01 ml  Output 2 ml  Net 1965.01 ml   Filed Weights   12/28/17 1702 12/29/17 0400 12/30/17 0507  Weight: 118.4 kg 118.2 kg 118.1 kg    Telemetry    SR - Personally Reviewed  ECG    N/a - Personally Reviewed  Physical Exam   General: Well developed, well nourished, male appearing in no acute distress. Head: Normocephalic, atraumatic.  Neck: Supple without bruits, JVD. Lungs:  Resp regular and unlabored, CTA. Heart: RRR, S1, S2, no murmur; no rub. Abdomen: Soft, non-tender, non-distended with normoactive bowel sounds.  Extremities: No clubbing, cyanosis, edema. Distal pedal pulses are 2+ bilaterally. Neuro: Alert and oriented X 3. Moves all extremities spontaneously. Psych: Normal affect.  Labs    Chemistry Recent Labs  Lab 12/28/17 2148 12/29/17 0619 12/30/17 0455  NA 141 141 142  K 4.2 4.3 4.5  CL 110 110 108  CO2 24 25 27   GLUCOSE  124* 135* 110*  BUN 19 17 15   CREATININE 0.89 0.95 0.88  CALCIUM 8.8* 8.9 8.5*  GFRNONAA >60 >60 >60  GFRAA >60 >60 >60  ANIONGAP 7 6 7      Hematology Recent Labs  Lab 12/28/17 0533 12/29/17 0619 12/30/17 0455  WBC 7.8 8.9 8.1  RBC 5.23 5.54 4.91  HGB 15.4 16.3 14.7  HCT 48.0 50.3 44.9  MCV 91.8 90.8 91.4  MCH 29.4 29.4 29.9  MCHC 32.1 32.4 32.7  RDW 13.0 13.0 13.2  PLT 187 213 185    Cardiac Enzymes Recent Labs  Lab 12/28/17 0533  TROPONINI 0.66*    Recent Labs  Lab 12/27/17 2352  TROPIPOC 0.10*     BNPNo results for input(s): BNP, PROBNP in the last 168 hours.   DDimer No results for input(s): DDIMER in the last 168 hours.    Radiology    No results found.  Cardiac Studies   N/a   Patient Profile     72 y.o. male with PMH of Aflutter, Afib, HL, OSA and OA who presented with atrial tachycardia with RVR. Planned for TEE/DCCV but developed SVT this morning and then converted into SR.   Assessment & Plan    1. Afib/Flutter: He had been on [facility] in the past but requested to stop after ablation. Since recurrence this admission  has been placed back on [place] with Eliquis 5mg  BID. Planned for TEE/DCCV but had episode of SVT with conversion to SR spontaneously. Has remained in SR. Mag was low yesterday and replaced.  -- remains on IV heparin with plans for cath.   2. Abnormal EKG/Elevated troponin: Trop 0.66 on admission, could be demand ischemia. After converting to SR repeat EKG showed T wave depression in inferolateral leads with ST evaluation in aVR which was new. Resolved with EKG later in the morning. No chest pain.  -- holding Eliquis, and on IV heparin in anticipation of cardiac cath today.  2. OSA: reports using cpap at home and being compliant.   3. Obesity: Working to lose weight.   4. HL: on statin.    Signed, Reino Bellis, NP  12/30/2017, 9:55 AM  Pager # [phone]   For questions or updates, please contact [place] Please  consult [email or web address] for contact info under Cardiology/STEMI.  I have personally seen and examined this patient. I agree with the assessment and plan as outlined above.  Pt without chest pain this am. Severe chest pain when he was in SVT and his EKG was concerning post conversion to sinus. Eliquis on hold for cath. He is on IV heparin. Cardiac cath later today.   Lauree Chandler 12/30/2017 10:30 AM

## 2017-12-30 NOTE — Plan of Care (Signed)
  Problem: Activity: Goal: Risk for activity intolerance will decrease Outcome: Progressing   

## 2017-12-30 NOTE — H&P (View-Only) (Signed)
Progress Note  Patient Name: Eric Santos Date of Encounter: 12/30/2017  Primary Cardiologist: No primary care provider on file.  Subjective   Feeling well this morning. No recurrent palpations.   Inpatient Medications    Scheduled Meds: . aspirin EC  81 mg Oral Daily  . metoprolol tartrate  25 mg Oral BID  . rosuvastatin  10 mg Oral q1800   Continuous Infusions: . sodium chloride Stopped (12/29/17 1000)  . sodium chloride 1 mL/kg/hr (12/30/17 0942)  . heparin 1,500 Units/hr (12/30/17 0331)   PRN Meds: acetaminophen, ondansetron (ZOFRAN) IV   Vital Signs    Vitals:   12/29/17 1203 12/29/17 2121 12/30/17 0507 12/30/17 0940  BP: 106/81 (!) 129/91 (!) 144/95 (!) 125/91  Pulse: 72 70 64 66  Resp: 18 14 16 16   Temp: 98.2 F (36.8 C) (!) 97.4 F (36.3 C) (!) 97.3 F (36.3 C) 98.1 F (36.7 C)  TempSrc: Oral Oral Oral Oral  SpO2: 95% 97% 100% 96%  Weight:   118.1 kg   Height:        Intake/Output Summary (Last 24 hours) at 12/30/2017 0955 Last data filed at 12/30/2017 5284 Gross per 24 hour  Intake 1967.01 ml  Output 2 ml  Net 1965.01 ml   Filed Weights   12/28/17 1702 12/29/17 0400 12/30/17 0507  Weight: 118.4 kg 118.2 kg 118.1 kg    Telemetry    SR - Personally Reviewed  ECG    N/a - Personally Reviewed  Physical Exam   General: Well developed, well nourished, male appearing in no acute distress. Head: Normocephalic, atraumatic.  Neck: Supple without bruits, JVD. Lungs:  Resp regular and unlabored, CTA. Heart: RRR, S1, S2, no murmur; no rub. Abdomen: Soft, non-tender, non-distended with normoactive bowel sounds.  Extremities: No clubbing, cyanosis, edema. Distal pedal pulses are 2+ bilaterally. Neuro: Alert and oriented X 3. Moves all extremities spontaneously. Psych: Normal affect.  Labs    Chemistry Recent Labs  Lab 12/28/17 2148 12/29/17 0619 12/30/17 0455  NA 141 141 142  K 4.2 4.3 4.5  CL 110 110 108  CO2 24 25 27   GLUCOSE  124* 135* 110*  BUN 19 17 15   CREATININE 0.89 0.95 0.88  CALCIUM 8.8* 8.9 8.5*  GFRNONAA >60 >60 >60  GFRAA >60 >60 >60  ANIONGAP 7 6 7      Hematology Recent Labs  Lab 12/28/17 0533 12/29/17 0619 12/30/17 0455  WBC 7.8 8.9 8.1  RBC 5.23 5.54 4.91  HGB 15.4 16.3 14.7  HCT 48.0 50.3 44.9  MCV 91.8 90.8 91.4  MCH 29.4 29.4 29.9  MCHC 32.1 32.4 32.7  RDW 13.0 13.0 13.2  PLT 187 213 185    Cardiac Enzymes Recent Labs  Lab 12/28/17 0533  TROPONINI 0.66*    Recent Labs  Lab 12/27/17 2352  TROPIPOC 0.10*     BNPNo results for input(s): BNP, PROBNP in the last 168 hours.   DDimer No results for input(s): DDIMER in the last 168 hours.    Radiology    No results found.  Cardiac Studies   N/a   Patient Profile     72 y.o. male with PMH of Aflutter, Afib, HL, OSA and OA who presented with atrial tachycardia with RVR. Planned for TEE/DCCV but developed SVT this morning and then converted into SR.   Assessment & Plan    1. Afib/Flutter: He had been on [facility] in the past but requested to stop after ablation. Since recurrence this admission  has been placed back on [place] with Eliquis 5mg  BID. Planned for TEE/DCCV but had episode of SVT with conversion to SR spontaneously. Has remained in SR. Mag was low yesterday and replaced.  -- remains on IV heparin with plans for cath.   2. Abnormal EKG/Elevated troponin: Trop 0.66 on admission, could be demand ischemia. After converting to SR repeat EKG showed T wave depression in inferolateral leads with ST evaluation in aVR which was new. Resolved with EKG later in the morning. No chest pain.  -- holding Eliquis, and on IV heparin in anticipation of cardiac cath today.  2. OSA: reports using cpap at home and being compliant.   3. Obesity: Working to lose weight.   4. HL: on statin.    Signed, Reino Bellis, NP  12/30/2017, 9:55 AM  Pager # [phone]   For questions or updates, please contact [place] Please  consult [email or web address] for contact info under Cardiology/STEMI.  I have personally seen and examined this patient. I agree with the assessment and plan as outlined above.  Pt without chest pain this am. Severe chest pain when he was in SVT and his EKG was concerning post conversion to sinus. Eliquis on hold for cath. He is on IV heparin. Cardiac cath later today.   Lauree Chandler 12/30/2017 10:30 AM
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
## 2017-12-30 NOTE — Interval H&P Note (Signed)
Cath Lab Visit (complete for each Cath Lab visit)  Clinical Evaluation Leading to the Procedure:   ACS: Yes.    Non-ACS:    Anginal Classification: CCS IV  Anti-ischemic medical therapy: Minimal Therapy (1 class of medications)  Non-Invasive Test Results: No non-invasive testing performed  Prior CABG: No previous CABG  History and Physical Interval Note:  12/30/2017 3:39 PM  Eric Santos  has presented today for surgery, with the diagnosis of cp  The various methods of treatment have been discussed with the patient and family. After consideration of risks, benefits and other options for treatment, the patient has consented to  Procedure(s): LEFT HEART CATH AND CORONARY ANGIOGRAPHY (N/A) as a surgical intervention .  The patient's history has been reviewed, patient examined, no change in status, stable for surgery.  I have reviewed the patient's chart and labs.  Questions were answered to the patient's satisfaction.     Sherren Mocha

## 2017-12-31 ENCOUNTER — Encounter (HOSPITAL_COMMUNITY): Payer: Self-pay | Admitting: Cardiovascular Disease

## 2017-12-31 DIAGNOSIS — R7989 Other specified abnormal findings of blood chemistry: Secondary | ICD-10-CM

## 2017-12-31 DIAGNOSIS — R9431 Abnormal electrocardiogram [ECG] [EKG]: Secondary | ICD-10-CM

## 2017-12-31 LAB — CBC
HCT: 44.8 % (ref 39.0–52.0)
HEMOGLOBIN: 14.4 g/dL (ref 13.0–17.0)
MCH: 29.5 pg (ref 26.0–34.0)
MCHC: 32.1 g/dL (ref 30.0–36.0)
MCV: 91.8 fL (ref 80.0–100.0)
Platelets: 179 10*3/uL (ref 150–400)
RBC: 4.88 MIL/uL (ref 4.22–5.81)
RDW: 13.1 % (ref 11.5–15.5)
WBC: 7 10*3/uL (ref 4.0–10.5)
nRBC: 0 % (ref 0.0–0.2)

## 2017-12-31 MED ORDER — APIXABAN 5 MG PO TABS
5.0000 mg | ORAL_TABLET | Freq: Two times a day (BID) | ORAL | Status: DC
  Administered 2017-12-31: 5 mg via ORAL

## 2017-12-31 MED ORDER — METOPROLOL TARTRATE 25 MG PO TABS: 25.0000 mg | ORAL_TABLET | Freq: Two times a day (BID) | ORAL | 2 refills | Status: DC

## 2017-12-31 MED ORDER — APIXABAN 5 MG PO TABS: 5.0000 mg | ORAL_TABLET | Freq: Two times a day (BID) | ORAL | 2 refills | Status: DC

## 2017-12-31 MED FILL — ELIQUIS 5 MG TABLET: 5 | 30 days supply | Qty: 60 | Fill #0 | Status: TO

## 2017-12-31 MED FILL — METOPROLOL TARTRATE 25 MG T: 25 | 30 days supply | Qty: 60 | Fill #0 | Status: TO

## 2017-12-31 NOTE — Discharge Summary (Signed)
Discharge Summary    Patient ID: Eric Santos,  MRN: [id number], DOB/AGE: [birth year] 72 y.o.  Admit date: 12/27/2017 Discharge date: 12/31/2017  Primary Care Provider: Biagio Borg Primary Cardiologist: Dr. Johnsie Cancel   Discharge Diagnoses    Active Problems:   Atrial flutter ([place])   Hyperlipidemia   OSA (obstructive sleep apnea)   Allergies Allergies  Allergen Reactions  . Adhesive [Tape] Other (See Comments)    "Peels off skin"    Diagnostic Studies/Procedures    Cath: 12/30/17   Mid Cx to Dist Cx lesion is 30% stenosed.  Prox LAD lesion is 25% stenosed.  The left ventricular systolic function is normal.  LV end diastolic pressure is mildly elevated.  The left ventricular ejection fraction is 55-65% by visual estimate.   1. 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 Normal LV systolic function with LVEF 55-65% _____________   History of Present Illness     72 y.o. male w/ obesity, OSA, and A-fib (s/p ablation 2017) who presented with palpitations.  In brief, the patient has a long-standing history of atrial fibrillation and underwent ablation with Dr. Rayann Heman in 2017.  The patient expressed a preference to discontinue oral anticoagulation after this and thus had not been anticoagulated since this time.  Over the past several years he described occasional symptoms of tachypalpitations that were minimally bothersome to him.  He would take a as needed dose of metoprolol and these would resolve.  On the day of presentation, the patient describes a sudden onset episode of tachypalpitations.  This was more severe than his prior episodes and in fact he felt as though he was going to pass out.  He attempted to take his heart rate and blood pressure with his home machine and states that his machine would not register his heart rate because it was going so fast.  With the symptoms, the patient also reported symptoms of central chest pressure, which is  not something he had experienced before.  The total duration of his symptoms were about 2 hours before his palpitations improved and his chest pain resolved. EKG showed onset of atrial flutter. He was started on IV heparin and metoprolol and admitted for further work up.   Hospital Course     1. Afib/Flutter: Plan was for TEE/DCCV but developed an episode of SVT and converted to SR. Remained in SR throughout the rest of admission. He had been on a anti-coagulation in the past but requested to stop after atrial flutter ablation. He was started on Eliquis 5 mg po BID and tolerated without complications. Continued on metoprolol 25mg  BID with blood pressure tolerating.   2. Abnormal EKG/Elevated troponin:Trop 0.66 on admission, which was felt to be likely demand ischemia from rapid heart rate. After episode of SVT, EKG showed new TWI diffusely and ST elevation in aVR. This improved on recheck several hours later. Given this change he underwent cardiac cath 12/30/17 with mild non-obstructive CAD.  -- ASA stopped, and continued on statin.   2. OSA: reported using cpap at home and being compliant.   3. Obesity:Working to lose weight.   4. HLD: Continue statin.     SHOICHI Santos was seen by Dr. Angelena Form and determined stable for discharge home. Follow up in the office has been arranged. Medications are listed below.   _____________  Discharge Vitals Blood pressure 126/83, pulse 75, temperature 98.9 F (37.2 C), temperature source Oral, resp. rate 18, height 5\' 6"  (1.676 m), weight  117.2 kg, SpO2 95 %.  Filed Weights   12/29/17 0400 12/30/17 0507 12/31/17 0426  Weight: 118.2 kg 118.1 kg 117.2 kg    Labs & Radiologic Studies    CBC Recent Labs    12/30/17 0455 12/31/17 0547  WBC 8.1 7.0  HGB 14.7 14.4  HCT 44.9 44.8  MCV 91.4 91.8  PLT 185 578   Basic Metabolic Panel Recent Labs    12/29/17 0619 12/29/17 0933 12/30/17 0455  NA 141  --  142  K 4.3  --  4.5  CL 110  --   108  CO2 25  --  27  GLUCOSE 135*  --  110*  BUN 17  --  15  CREATININE 0.95  --  0.88  CALCIUM 8.9  --  8.5*  MG  --  1.9 2.1   Liver Function Tests No results for input(s): AST, ALT, ALKPHOS, BILITOT, PROT, ALBUMIN in the last 72 hours. No results for input(s): LIPASE, AMYLASE in the last 72 hours. Cardiac Enzymes No results for input(s): CKTOTAL, CKMB, CKMBINDEX, TROPONINI in the last 72 hours. BNP Invalid input(s): POCBNP D-Dimer No results for input(s): DDIMER in the last 72 hours. Hemoglobin A1C No results for input(s): HGBA1C in the last 72 hours. Fasting Lipid Panel No results for input(s): CHOL, HDL, LDLCALC, TRIG, CHOLHDL, LDLDIRECT in the last 72 hours. Thyroid Function Tests No results for input(s): TSH, T4TOTAL, T3FREE, THYROIDAB in the last 72 hours.  Invalid input(s): FREET3 _____________  Dg Chest 2 View  Result Date: 12/28/2017 CLINICAL DATA:  Tachycardia with chest pain and shortness of breath EXAM: CHEST - 2 VIEW COMPARISON:  August 05, 2015 FINDINGS: There is no edema or consolidation. Heart is upper normal in size with pulmonary vascularity normal. No adenopathy. There is a focal hiatal hernia. There is degenerative change in the thoracic spine. No pneumothorax. IMPRESSION: No edema or consolidation. Hiatal hernia present. Heart upper normal in size. Electronically Signed   By: Lowella Grip III M.D.   On: 12/28/2017 00:55   Disposition   Pt is being discharged home today in good condition.  Follow-up Plans & Appointments    Follow-up Information    Burtis Junes, NP Follow up on 01/13/2018.   Specialties:  Nurse Practitioner, Interventional Cardiology, Cardiology, Radiology Why:  at 11am for your follow up appt.  Contact information: [address] [place] [address] [phone]          Discharge Instructions    Call MD for:  redness, tenderness, or signs of infection (pain, swelling, redness, odor or green/yellow  discharge around incision site)   Complete by:  As directed    Diet - low sodium heart healthy   Complete by:  As directed    Discharge instructions   Complete by:  As directed    Radial Site Care Refer to this sheet in the next few weeks. These instructions provide you with information on caring for yourself after your procedure. Your caregiver may also give you more specific instructions. Your treatment has been planned according to current medical practices, but problems sometimes occur. Call your caregiver if you have any problems or questions after your procedure. HOME CARE INSTRUCTIONS You may shower the day after the procedure.Remove the bandage (dressing) and gently wash the site with plain soap and water.Gently pat the site dry.  Do not apply powder or lotion to the site.  Do not submerge the affected site in water for 3 to  5 days.  Inspect the site at least twice daily.  Do not flex or bend the affected arm for 24 hours.  No lifting over 5 pounds (2.3 kg) for 5 days after your procedure.  Do not drive home if you are discharged the same day of the procedure. Have someone else drive you.  You may drive 24 hours after the procedure unless otherwise instructed by your caregiver.  What to expect: Any bruising will usually fade within 1 to 2 weeks.  Blood that collects in the tissue (hematoma) may be painful to the touch. It should usually decrease in size and tenderness within 1 to 2 weeks.  SEEK IMMEDIATE MEDICAL CARE IF: You have unusual pain at the radial site.  You have redness, warmth, swelling, or pain at the radial site.  You have drainage (other than a small amount of blood on the dressing).  You have chills.  You have a fever or persistent symptoms for more than 72 hours.  You have a fever and your symptoms suddenly get worse.  Your arm becomes pale, cool, tingly, or numb.  You have heavy bleeding from the site. Hold pressure on the site.   Increase activity slowly    Complete by:  As directed      Discharge Medications     Medication List    STOP taking these medications   ALEVE PM 220-25 MG Tabs Generic drug:  Naproxen Sod-diphenhydrAMINE   doxycycline 100 MG capsule Commonly known as:  MONODOX     TAKE these medications   apixaban 5 MG Tabs tablet Commonly known as:  ELIQUIS Take 1 tablet (5 mg total) by mouth 2 (two) times daily.   CENTRUM SILVER 50+MEN Tabs Take 1 tablet by mouth daily.   cetirizine 10 MG tablet Commonly known as:  ZYRTEC Take 10 mg by mouth daily.   fluticasone 50 MCG/ACT nasal spray Commonly known as:  FLONASE Place 1 spray daily into both nostrils. What changed:    when to take this  reasons to take this   metoprolol tartrate 25 MG tablet Commonly known as:  LOPRESSOR Take 1 tablet (25 mg total) by mouth 2 (two) times daily.   minocycline 100 MG capsule Commonly known as:  MINOCIN,DYNACIN Take 100 mg by mouth 2 (two) times daily.   nystatin cream Commonly known as:  MYCOSTATIN Apply 1 application topically See admin instructions. Apply to affected areas two times a day as directed   oxyCODONE-acetaminophen 5-325 MG tablet Commonly known as:  PERCOCET/ROXICET Take 1 tablet by mouth every 4 (four) hours as needed for severe pain.   PROBIOTIC ACIDOPHILUS Tabs Take 1 tablet by mouth at bedtime.   rosuvastatin 10 MG tablet Commonly known as:  CRESTOR TAKE 1 TABLET BY MOUTH  DAILY   traMADol 50 MG tablet Commonly known as:  ULTRAM Take 50 mg by mouth every 6 (six) hours as needed (moderate pain or spasms). Muscle spasms   VITAMIN B-12 PO Take 1 tablet by mouth daily.       Acute coronary syndrome (MI, NSTEMI, STEMI, etc) this admission?: No.   Outstanding Labs/Studies   N/a   Duration of Discharge Encounter   Greater than 30 minutes including physician time.  Signed, Reino Bellis NP-C 12/31/2017, 10:02 AM

## 2017-12-31 NOTE — Progress Notes (Signed)
Eliquis coupon card given to patient with explanation of usage. B Laural Eiland RN,MHA,BSN [phone] 

## 2017-12-31 NOTE — Progress Notes (Signed)
Progress Note  Patient Name: Eric Santos Date of Encounter: 12/31/2017  Primary Cardiologist: Johnsie Cancel  Subjective   No chest pain or dyspnea this am  Inpatient Medications    Scheduled Meds: . apixaban  5 mg Oral BID  . aspirin EC  81 mg Oral Daily  . metoprolol tartrate  25 mg Oral BID  . rosuvastatin  10 mg Oral q1800  . sodium chloride flush  3 mL Intravenous Q12H   Continuous Infusions: . sodium chloride     PRN Meds: sodium chloride, acetaminophen, ondansetron (ZOFRAN) IV, sodium chloride flush   Vital Signs    Vitals:   12/30/17 1931 12/30/17 2010 12/30/17 2135 12/31/17 0426  BP: 112/80 120/77 113/83 104/74  Pulse: 80 82 72 72  Resp:  18  18  Temp:  98.1 F (36.7 C)  98.9 F (37.2 C)  TempSrc:  Oral  Oral  SpO2: 98% 96%  95%  Weight:    117.2 kg  Height:        Intake/Output Summary (Last 24 hours) at 12/31/2017 0820 Last data filed at 12/31/2017 0618 Gross per 24 hour  Intake 1698.02 ml  Output 1927 ml  Net -228.98 ml   Filed Weights   12/29/17 0400 12/30/17 0507 12/31/17 0426  Weight: 118.2 kg 118.1 kg 117.2 kg    Telemetry    sinus- Personally Reviewed  ECG    No am ekg- Personally Reviewed  Physical Exam    Labs    Chemistry Recent Labs  Lab 12/28/17 2148 12/29/17 0619 12/30/17 0455  NA 141 141 142  K 4.2 4.3 4.5  CL 110 110 108  CO2 24 25 27   GLUCOSE 124* 135* 110*  BUN 19 17 15   CREATININE 0.89 0.95 0.88  CALCIUM 8.8* 8.9 8.5*  GFRNONAA >60 >60 >60  GFRAA >60 >60 >60  ANIONGAP 7 6 7      Hematology Recent Labs  Lab 12/29/17 0619 12/30/17 0455 12/31/17 0547  WBC 8.9 8.1 7.0  RBC 5.54 4.91 4.88  HGB 16.3 14.7 14.4  HCT 50.3 44.9 44.8  MCV 90.8 91.4 91.8  MCH 29.4 29.9 29.5  MCHC 32.4 32.7 32.1  RDW 13.0 13.2 13.1  PLT 213 185 179    Cardiac Enzymes Recent Labs  Lab 12/28/17 0533  TROPONINI 0.66*    Recent Labs  Lab 12/27/17 2352  TROPIPOC 0.10*     BNPNo results for input(s): BNP, PROBNP  in the last 168 hours.   DDimer No results for input(s): DDIMER in the last 168 hours.    Radiology    No results found.  Cardiac Studies   N/a   Patient Profile     72 y.o. male with PMH of Aflutter, Afib, HL, OSA and OA who presented with atrial tachycardia with RVR. Planned for TEE/DCCV but developed SVT the morning of 12/29/17 and then converted to sinus. Chest pain while tachycardic and abnormal EKG post conversion. Cardiac cath 12/30/17 with mild CAD.    Assessment & Plan    1. Afib/Flutter: Sinus this am. He had been on a anti-coagulation in the past but requested to stop after atrial flutter ablation. We will restart Eliquis 5 mg po BID today.  Will continue metoprolol.   2. Abnormal EKG/Elevated troponin: Trop 0.66 on admission, likely demand ischemia from rapid heart rate. Cardiac cath 12/30/17 with mild non-obstructive CAD. Continue statin.   2. OSA: reports using cpap at home and being compliant.   3. Obesity: Working to lose  weight.   4. HLD: Continue statin.    Discharge home today. Follow up 1-2 weeks with DR. Nishan or office APP   Signed, Lauree Chandler, MD  12/31/2017, 8:20 AM

## 2018-01-01 ENCOUNTER — Telehealth: Payer: Self-pay | Admitting: Nurse Practitioner

## 2018-01-01 NOTE — Telephone Encounter (Signed)
Pt c/o medication issue:  1. Name of Medication:  Eliquis  2. How are you currently taking this medication (dosage and times per day)? 2 times a day  3. Are you having a reaction (difficulty breathing--STAT)? no  4. What is your medication issue? 30 minutes after he takes the Eliquis he get dizzy and that last for about 3 minutes

## 2018-01-01 NOTE — Telephone Encounter (Signed)
Called patient about his message. Patient stated he notice that the last two times he took eliquis 30 minutes later he had a 3 minute dizzy spell. Informed patient that he needs to take eliquis with food and drink some fluids when taking eliquis. Encouraged patient to try this and see if his symptom goes away. Patient verbalized understanding. Will forward to Dr. Johnsie Cancel for further advisement.

## 2018-01-01 NOTE — Telephone Encounter (Signed)
That's fine

## 2018-01-09 ENCOUNTER — Other Ambulatory Visit: Payer: Self-pay | Admitting: Internal Medicine

## 2018-01-12 ENCOUNTER — Other Ambulatory Visit (INDEPENDENT_AMBULATORY_CARE_PROVIDER_SITE_OTHER): Payer: Medicare Other

## 2018-01-12 DIAGNOSIS — R7302 Impaired glucose tolerance (oral): Secondary | ICD-10-CM | POA: Diagnosis not present

## 2018-01-12 DIAGNOSIS — E785 Hyperlipidemia, unspecified: Secondary | ICD-10-CM

## 2018-01-12 DIAGNOSIS — Z0000 Encounter for general adult medical examination without abnormal findings: Secondary | ICD-10-CM | POA: Diagnosis not present

## 2018-01-12 LAB — BASIC METABOLIC PANEL
BUN: 20 mg/dL (ref 6–23)
CALCIUM: 10 mg/dL (ref 8.4–10.5)
CHLORIDE: 106 meq/L (ref 96–112)
CO2: 30 mEq/L (ref 19–32)
CREATININE: 0.99 mg/dL (ref 0.40–1.50)
GFR: 79.19 mL/min (ref >60.00)
Glucose, Bld: 122 mg/dL — ABNORMAL HIGH (ref 70–99)
Potassium: 5.7 mEq/L — ABNORMAL HIGH (ref 3.5–5.1)
Sodium: 144 mEq/L (ref 135–145)

## 2018-01-12 LAB — LIPID PANEL
CHOL/HDL RATIO: 4
CHOLESTEROL: 149 mg/dL (ref 0–200)
HDL: 39.7 mg/dL (ref >39.00)
LDL CALC: 73 mg/dL (ref 0–99)
NonHDL: 109.57
TRIGLYCERIDES: 181 mg/dL — AB (ref 0.0–149.0)
VLDL: 36.2 mg/dL (ref 0.0–40.0)

## 2018-01-12 LAB — CBC WITH DIFFERENTIAL/PLATELET
BASOS ABS: 0.1 10*3/uL (ref 0.0–0.1)
Basophils Relative: 1.1 % (ref 0.0–3.0)
EOS ABS: 0.2 10*3/uL (ref 0.0–0.7)
Eosinophils Relative: 3.4 % (ref 0.0–5.0)
HEMATOCRIT: 48.2 % (ref 39.0–52.0)
HEMOGLOBIN: 16.3 g/dL (ref 13.0–17.0)
Lymphocytes Relative: 26.2 % (ref 12.0–46.0)
Lymphs Abs: 1.5 10*3/uL (ref 0.7–4.0)
MCHC: 33.9 g/dL (ref 30.0–36.0)
MCV: 91.1 fl (ref 78.0–100.0)
MONOS PCT: 11.5 % (ref 3.0–12.0)
Monocytes Absolute: 0.7 10*3/uL (ref 0.1–1.0)
Neutro Abs: 3.3 10*3/uL (ref 1.4–7.7)
Neutrophils Relative %: 57.8 % (ref 43.0–77.0)
PLATELETS: 202 10*3/uL (ref 150.0–400.0)
RBC: 5.29 Mil/uL (ref 4.22–5.81)
RDW: 13.4 % (ref 11.5–15.5)
WBC: 5.8 10*3/uL (ref 4.0–10.5)

## 2018-01-12 LAB — URINALYSIS, ROUTINE W REFLEX MICROSCOPIC
BILIRUBIN URINE: NEGATIVE
Ketones, ur: NEGATIVE
Leukocytes, UA: NEGATIVE
Nitrite: NEGATIVE
Specific Gravity, Urine: 1.025 (ref 1.000–1.030)
Total Protein, Urine: NEGATIVE
Urine Glucose: NEGATIVE
Urobilinogen, UA: 0.2 (ref 0.0–1.0)
pH: 5.5 (ref 5.0–8.0)

## 2018-01-12 LAB — HEPATIC FUNCTION PANEL
ALK PHOS: 57 U/L (ref 39–117)
ALT: 22 U/L (ref 0–53)
AST: 19 U/L (ref 0–37)
Albumin: 4.3 g/dL (ref 3.5–5.2)
BILIRUBIN DIRECT: 0.1 mg/dL (ref 0.0–0.3)
TOTAL PROTEIN: 6.4 g/dL (ref 6.0–8.3)
Total Bilirubin: 0.5 mg/dL (ref 0.2–1.2)

## 2018-01-12 LAB — TSH: TSH: 4.4 u[IU]/mL (ref 0.35–4.50)

## 2018-01-12 LAB — HEMOGLOBIN A1C: Hgb A1c MFr Bld: 6.1 % (ref 4.6–6.5)

## 2018-01-12 LAB — PSA: PSA: 1.87 ng/mL (ref 0.10–4.00)
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
## 2018-01-13 ENCOUNTER — Telehealth: Payer: Self-pay

## 2018-01-13 ENCOUNTER — Ambulatory Visit: Payer: Medicare Other | Admitting: Medical

## 2018-01-13 ENCOUNTER — Other Ambulatory Visit: Payer: Self-pay | Admitting: *Deleted

## 2018-01-13 ENCOUNTER — Encounter: Payer: Self-pay | Admitting: Medical

## 2018-01-13 VITALS — BP 140/90 | HR 68 | Ht 66.0 in | Wt 257.8 lb

## 2018-01-13 DIAGNOSIS — I480 Paroxysmal atrial fibrillation: Secondary | ICD-10-CM

## 2018-01-13 DIAGNOSIS — E875 Hyperkalemia: Secondary | ICD-10-CM | POA: Diagnosis not present

## 2018-01-13 LAB — BASIC METABOLIC PANEL
BUN/Creatinine Ratio: 21 (ref 10–24)
BUN: 17 mg/dL (ref 8–27)
CALCIUM: 9.4 mg/dL (ref 8.6–10.2)
CO2: 21 mmol/L (ref 20–29)
CREATININE: 0.8 mg/dL (ref 0.76–1.27)
Chloride: 106 mmol/L (ref 96–106)
GFR calc Af Amer: 104 mL/min/{1.73_m2} (ref >59)
GFR, EST NON AFRICAN AMERICAN: 90 mL/min/{1.73_m2} (ref >59)
Glucose: 92 mg/dL (ref 65–99)
POTASSIUM: 4.4 mmol/L (ref 3.5–5.2)
Sodium: 142 mmol/L (ref 134–144)

## 2018-01-13 MED ORDER — METOPROLOL TARTRATE 25 MG PO TABS: 12.5000 mg | ORAL_TABLET | Freq: Two times a day (BID) | ORAL | 2 refills | Status: DC

## 2018-01-13 NOTE — Patient Instructions (Signed)
We will be checking the following labs today - BMET to monitor your potassium level   Medication Instructions:    Decrease your metoprolol to 12.5mg  (1/2 tab) 2 times per day.    Testing/Procedures To Be Arranged:  N/A  Follow-Up:   See Dr. Johnsie Cancel in 4 months or sooner if any issues arise.    Other Special Instructions:   DASH Eating Plan DASH stands for "Dietary Approaches to Stop Hypertension." The DASH eating plan is a healthy eating plan that has been shown to reduce high blood pressure (hypertension). It may also reduce your risk for type 2 diabetes, heart disease, and stroke. The DASH eating plan may also help with weight loss. What are tips for following this plan? General guidelines Avoid eating more than 2,300 mg (milligrams) of salt (sodium) a day. If you have hypertension, you may need to reduce your sodium intake to 1,500 mg a day. Limit alcohol intake to no more than 1 drink a day for nonpregnant women and 2 drinks a day for men. One drink equals 12 oz of beer, 5 oz of wine, or 1 oz of hard liquor. Work with your health care provider to maintain a healthy body weight or to lose weight. Ask what an ideal weight is for you. Get at least 30 minutes of exercise that causes your heart to beat faster (aerobic exercise) most days of the week. Activities may include walking, swimming, or biking. Work with your health care provider or diet and nutrition specialist (dietitian) to adjust your eating plan to your individual calorie needs. Reading food labels Check food labels for the amount of sodium per serving. Choose foods with less than 5 percent of the Daily Value of sodium. Generally, foods with less than 300 mg of sodium per serving fit into this eating plan. To find whole grains, look for the word "whole" as the first word in the ingredient list. Shopping Buy products labeled as "low-sodium" or "no salt added." Buy fresh foods. Avoid canned foods and premade or frozen  meals. Cooking Avoid adding salt when cooking. Use salt-free seasonings or herbs instead of table salt or sea salt. Check with your health care provider or pharmacist before using salt substitutes. Do not fry foods. Cook foods using healthy methods such as baking, boiling, grilling, and broiling instead. Cook with heart-healthy oils, such as olive, canola, soybean, or sunflower oil. Meal planning  Eat a balanced diet that includes: 5 or more servings of fruits and vegetables each day. At each meal, try to fill half of your plate with fruits and vegetables. Up to 6-8 servings of whole grains each day. Less than 6 oz of lean meat, poultry, or fish each day. A 3-oz serving of meat is about the same size as a deck of cards. One egg equals 1 oz. 2 servings of low-fat dairy each day. A serving of nuts, seeds, or beans 5 times each week. Heart-healthy fats. Healthy fats called Omega-3 fatty acids are found in foods such as flaxseeds and coldwater fish, like sardines, salmon, and mackerel. Limit how much you eat of the following: Canned or prepackaged foods. Food that is high in trans fat, such as fried foods. Food that is high in saturated fat, such as fatty meat. Sweets, desserts, sugary drinks, and other foods with added sugar. Full-fat dairy products. Do not salt foods before eating. Try to eat at least 2 vegetarian meals each week. Eat more home-cooked food and less restaurant, buffet, and fast food. When  eating at a restaurant, ask that your food be prepared with less salt or no salt, if possible. What foods are recommended? The items listed may not be a complete list. Talk with your dietitian about what dietary choices are best for you. Grains Whole-grain or whole-wheat bread. Whole-grain or whole-wheat pasta. Brown rice. [place]. Bulgur. Whole-grain and low-sodium cereals. Pita bread. Low-fat, low-sodium crackers. Whole-wheat flour tortillas. Vegetables Fresh or frozen vegetables  (raw, steamed, roasted, or grilled). Low-sodium or reduced-sodium tomato and vegetable juice. Low-sodium or reduced-sodium tomato sauce and tomato paste. Low-sodium or reduced-sodium canned vegetables. Fruits All fresh, dried, or frozen fruit. Canned fruit in natural juice (without added sugar). Meat and other protein foods Skinless chicken or [place]. Ground chicken or [place]. Pork with fat trimmed off. Fish and seafood. Egg whites. Dried beans, peas, or lentils. Unsalted nuts, nut butters, and seeds. Unsalted canned beans. Lean cuts of beef with fat trimmed off. Low-sodium, lean deli meat. Dairy Low-fat (1%) or fat-free (skim) milk. Fat-free, low-fat, or reduced-fat cheeses. Nonfat, low-sodium ricotta or cottage cheese. Low-fat or nonfat yogurt. Low-fat, low-sodium cheese. Fats and oils Soft margarine without trans fats. Vegetable oil. Low-fat, reduced-fat, or light mayonnaise and salad dressings (reduced-sodium). Canola, safflower, olive, soybean, and sunflower oils. Avocado. Seasoning and other foods Herbs. Spices. Seasoning mixes without salt. Unsalted popcorn and pretzels. Fat-free sweets. What foods are not recommended? The items listed may not be a complete list. Talk with your dietitian about what dietary choices are best for you. Grains Baked goods made with fat, such as croissants, muffins, or some breads. Dry pasta or rice meal packs. Vegetables Creamed or fried vegetables. Vegetables in a cheese sauce. Regular canned vegetables (not low-sodium or reduced-sodium). Regular canned tomato sauce and paste (not low-sodium or reduced-sodium). Regular tomato and vegetable juice (not low-sodium or reduced-sodium). Angie Fava. Olives. Fruits Canned fruit in a light or heavy syrup. Fried fruit. Fruit in cream or butter sauce. Meat and other protein foods Fatty cuts of meat. Ribs. Fried meat. Berniece Salines. Sausage. Bologna and other processed lunch meats. Salami. Fatback. Hotdogs. Bratwurst. Salted nuts  and seeds. Canned beans with added salt. Canned or smoked fish. Whole eggs or egg yolks. Chicken or [place] with skin. Dairy Whole or 2% milk, cream, and half-and-half. Whole or full-fat cream cheese. Whole-fat or sweetened yogurt. Full-fat cheese. Nondairy creamers. Whipped toppings. Processed cheese and cheese spreads. Fats and oils Butter. Stick margarine. Lard. Shortening. Ghee. Bacon fat. Tropical oils, such as coconut, palm kernel, or palm oil. Seasoning and other foods Salted popcorn and pretzels. Onion salt, garlic salt, seasoned salt, table salt, and sea salt. Worcestershire sauce. Tartar sauce. Barbecue sauce. Teriyaki sauce. Soy sauce, including reduced-sodium. Steak sauce. Canned and packaged gravies. Fish sauce. Oyster sauce. Cocktail sauce. Horseradish that you find on the shelf. Ketchup. Mustard. Meat flavorings and tenderizers. Bouillon cubes. Hot sauce and Tabasco sauce. Premade or packaged marinades. Premade or packaged taco seasonings. Relishes. Regular salad dressings. Where to find more information: National Heart, Lung, and [place]: https://wilson-eaton.com/ American Heart Association: www.heart.org Summary The DASH eating plan is a healthy eating plan that has been shown to reduce high blood pressure (hypertension). It may also reduce your risk for type 2 diabetes, heart disease, and stroke. With the DASH eating plan, you should limit salt (sodium) intake to 2,300 mg a day. If you have hypertension, you may need to reduce your sodium intake to 1,500 mg a day. When on the DASH eating plan, aim to eat more fresh fruits  and vegetables, whole grains, lean proteins, low-fat dairy, and heart-healthy fats. Work with your health care provider or diet and nutrition specialist (dietitian) to adjust your eating plan to your individual calorie needs. This information is not intended to replace advice given to you by your health care provider. Make sure you discuss any questions you have with  your health care provider. Document Released: 02/07/2011 Document Revised: 02/12/2016 Document Reviewed: 02/12/2016 Elsevier Interactive Patient Education  [employer].     If you need a refill on your cardiac medications before your next appointment, please call your pharmacy.   Keep a log of your blood pressure readings and notify the office if you have BP persistently >140/90.    Call the [place] office at [phone] if you have any questions, problems or concerns.

## 2018-01-13 NOTE — Telephone Encounter (Signed)
-----   Message from Biagio Borg, MD sent at 01/12/2018  7:36 PM EST ----- Left message on MyChart, pt to cont same tx except  The test results show that your current treatment is OK, except there is blood in the urine.  You have had several kidney stones in the past to account for this, but I see on the record from the last visit we know about in 2017, that it was recommended for you to return in 2018 for Urine testing, KUB xray and kidney ultrasound and office visit.  I will ask the staff to call to see if you have been able to do this, as you may need a follow up appt now at any rate if not already scheduled.   Shirron to please ask pt if he has been able to f/u with Urology since 2017, or do I need to refer him back?  thanks

## 2018-01-13 NOTE — Telephone Encounter (Signed)
LMTCB

## 2018-01-13 NOTE — Telephone Encounter (Signed)
Called pt, line was busy.   CRM created

## 2018-01-13 NOTE — Telephone Encounter (Signed)
-----   Message from Abigail Butts, PA-C sent at 01/13/2018  4:22 PM EST ----- Please notify the patient that his blood work is normal. His potassium level was in a normal range - its possible yesterday's results were an error. He should continue his current medications as discussed at his visit 01/13/18. Thank you!

## 2018-01-16 ENCOUNTER — Ambulatory Visit: Payer: Medicare Other | Admitting: Internal Medicine

## 2018-01-22 ENCOUNTER — Encounter: Payer: Self-pay | Admitting: Internal Medicine

## 2018-01-22 ENCOUNTER — Ambulatory Visit: Payer: Medicare Other | Admitting: Internal Medicine

## 2018-01-22 VITALS — BP 124/78 | HR 73 | Temp 98.4°F | Ht 66.0 in | Wt 257.0 lb

## 2018-01-22 DIAGNOSIS — J309 Allergic rhinitis, unspecified: Secondary | ICD-10-CM

## 2018-01-22 DIAGNOSIS — G4733 Obstructive sleep apnea (adult) (pediatric): Secondary | ICD-10-CM | POA: Diagnosis not present

## 2018-01-22 DIAGNOSIS — M25562 Pain in left knee: Secondary | ICD-10-CM

## 2018-01-22 DIAGNOSIS — Z23 Encounter for immunization: Secondary | ICD-10-CM

## 2018-01-22 DIAGNOSIS — R7302 Impaired glucose tolerance (oral): Secondary | ICD-10-CM | POA: Diagnosis not present

## 2018-01-22 DIAGNOSIS — Z0000 Encounter for general adult medical examination without abnormal findings: Secondary | ICD-10-CM

## 2018-01-22 DIAGNOSIS — R5383 Other fatigue: Secondary | ICD-10-CM

## 2018-01-22 DIAGNOSIS — E785 Hyperlipidemia, unspecified: Secondary | ICD-10-CM

## 2018-01-22 MED ORDER — DICLOFENAC SODIUM 1 % TD GEL: 2.0000 g | Freq: Four times a day (QID) | TRANSDERMAL | 5 refills | Status: AC | PRN

## 2018-01-22 MED ORDER — APIXABAN 5 MG PO TABS: 5.0000 mg | ORAL_TABLET | Freq: Two times a day (BID) | ORAL | 3 refills | Status: DC

## 2018-01-22 MED ORDER — OXYCODONE-ACETAMINOPHEN 5-325 MG PO TABS: 1.0000 | ORAL_TABLET | ORAL | 0 refills | Status: DC | PRN

## 2018-01-22 MED ORDER — ROSUVASTATIN CALCIUM 10 MG PO TABS: 10.0000 mg | ORAL_TABLET | Freq: Every day | ORAL | 3 refills | Status: DC

## 2018-01-22 MED ORDER — METOPROLOL TARTRATE 25 MG PO TABS: 12.5000 mg | ORAL_TABLET | Freq: Two times a day (BID) | ORAL | 3 refills | Status: DC

## 2018-01-22 MED ORDER — TRIAMCINOLONE ACETONIDE 55 MCG/ACT NA AERO: 2.0000 | INHALATION_SPRAY | Freq: Every day | NASAL | 3 refills | Status: DC

## 2018-01-22 NOTE — Assessment & Plan Note (Signed)
To change flonase to nasacort for less risk for nosebleeds
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
## 2018-01-22 NOTE — Assessment & Plan Note (Signed)
stable overall by history and exam, recent data reviewed with pt, and pt to continue medical treatment as before,  to f/u any worsening symptoms or concerns, cont Bipap

## 2018-01-22 NOTE — Assessment & Plan Note (Addendum)
stable overall by history and exam, recent data reviewed with pt, and pt to continue medical treatment as before,  to f/u any worsening symptoms or concerns  Note:  Total time for pt hx, exam, review of record with pt in the room, determination of diagnoses and plan for further eval and tx is > 40 min, with over 50% spent in coordination and counseling of patient including the differential dx, tx, further evaluation and other management of fatigue, hyperglycemia, osa, HTN, allergic rhinitis, and left knee pain

## 2018-01-22 NOTE — Patient Instructions (Signed)
Please take all new medication as prescribed - the voltaren gel topical for the left knee pain  You had the Tdap tetanus shot today  Please continue all other medications as before, and refills have been done if requested.  Please have the pharmacy call with any other refills you may need.  Please continue your efforts at being more active, low cholesterol diet, and weight control..  Please keep your appointments with your specialists as you may have planned  No further lab work needed today  Please return in 6 months, or sooner if needed, with Lab testing done 3-5 days before

## 2018-01-22 NOTE — Assessment & Plan Note (Signed)
stable overall by history and exam, recent data reviewed with pt, and pt to continue medical treatment as before,  to f/u any worsening symptoms or concerns  

## 2018-01-22 NOTE — Assessment & Plan Note (Signed)
Etiology unclear, Exam otherwise benign, to check labs as documented, follow with expectant management  

## 2018-01-22 NOTE — Assessment & Plan Note (Signed)
For voltaren gel topical prn

## 2018-02-16 ENCOUNTER — Other Ambulatory Visit: Payer: Self-pay | Admitting: Cardiology

## 2018-03-02 ENCOUNTER — Telehealth: Payer: Self-pay | Admitting: Cardiovascular Disease

## 2018-03-02 NOTE — Telephone Encounter (Signed)
Called patient back about his message. Patient stated he has been having bleeding in his urine for several months now, but has gotten worse since 02/25/18. Patient stated he has an appointment with urology on Thursday. Patient was wanting to know if he could stop eliquis. Patient stated he has not have any episodes of A. Fib since October. Patient stated he can feel when he is in A. Fib. Consulted Dr. Johnsie Cancel and he recommend patient to hold eliquis until his sees Urologist on Thursday. Patient verbalized understanding.

## 2018-03-02 NOTE — Telephone Encounter (Signed)
New Message   Pt c/o medication issue:  1. Name of Medication: (ELIQUIS) 5 MG    2. How are you currently taking this medication (dosage and times per day)? 2x daily  3. Are you having a reaction (difficulty breathing--STAT)? No  4. What is your medication issue? Pt is having bleeding in Urine and is concerned its from this medication

## 2018-03-06 ENCOUNTER — Telehealth: Payer: Self-pay | Admitting: Cardiovascular Disease

## 2018-03-06 NOTE — Telephone Encounter (Signed)
Patient saw urology yesterday. Patient wife stated urology would leave it up to Dr. Johnsie Cancel to decide if patient needs to go on a lower dose of eliquis, keep on the same dose, or try something that is not as strong. Patient's wife stated Patient's urine did clear up and had no blood in it after stopping eliquis. Urology is having patient get a CT on 03/18/18 and follow up with them on 04/08/18. Will forward to Dr. Johnsie Cancel for advisement about going back on eliquis or something else.

## 2018-03-06 NOTE — Telephone Encounter (Signed)
New message     Pt c/o medication issue:  1. Name of Medication:   apixaban (ELIQUIS) 5 MG TABS tablet     2. How are you currently taking this medication (dosage and times per day)? 2x a day   3. Are you having a reaction (difficulty breathing--STAT)? no  4. What is your medication issue? Pt seen urology  and was told to stop taking due to blood in urine .pt wants to know what to do  And how often should he take it . Please call

## 2018-03-09 MED ORDER — APIXABAN 2.5 MG PO TABS: 2.5000 mg | ORAL_TABLET | Freq: Two times a day (BID) | ORAL | 3 refills | Status: DC

## 2018-03-09 NOTE — Telephone Encounter (Signed)
Called patient with Dr. Kyla Balzarine recommendations. Patient will see if he can tolerated Elliquis 2.5 mg by mouth twice daily. Sent prescription to patient's pharmacy of choice.

## 2018-03-09 NOTE — Telephone Encounter (Signed)
See if he tolerates 2.5 bid of eliquis

## 2018-03-11 ENCOUNTER — Telehealth: Payer: Self-pay | Admitting: Cardiovascular Disease

## 2018-03-11 NOTE — Telephone Encounter (Signed)
Called patient back about his message. Patient stated he started back on eliquis 2.5  BID on Monday evening, lower dose as Dr. Johnsie Cancel suggested. Patient stated he was fine until today, he said his urine is back to having bright red blood with no sediment. Patient stated his CT for urology is on the 03/18/18. Patient wants to know what he needs to do about taking his eliquis. Will forward to Dr. Johnsie Cancel for advisement.

## 2018-03-11 NOTE — Telephone Encounter (Signed)
New Message   Pt c/o medication issue:  1. Name of Medication: Eliquis  2. How are you currently taking this medication (dosage and times per day)? Take 1 tablet (2.5 mg total) by mouth 2 (two) times daily  3. Are you having a reaction (difficulty breathing--STAT)? yes  4. What is your medication issue? Pt states he is having blood in his urine. Please call

## 2018-03-11 NOTE — Telephone Encounter (Signed)
Called patient and told him to stop eliquis for now if he has hematuria recurring. Patient verbalized understanding.

## 2018-03-11 NOTE — Telephone Encounter (Signed)
If his hematuria has recurred would stop for now

## 2018-04-14 ENCOUNTER — Other Ambulatory Visit: Payer: Self-pay | Admitting: Cardiovascular Disease

## 2018-04-14 MED ORDER — METOPROLOL TARTRATE 25 MG PO TABS: 12.5000 mg | ORAL_TABLET | Freq: Two times a day (BID) | ORAL | 3 refills | Status: DC

## 2018-04-14 NOTE — Telephone Encounter (Signed)
Pt's medication was sent to pt's pharmacy as requested. Confirmation received.  °

## 2018-05-06 ENCOUNTER — Encounter: Payer: Self-pay | Admitting: Cardiovascular Disease
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
## 2018-05-21 ENCOUNTER — Ambulatory Visit: Payer: Medicare Other | Admitting: Cardiovascular Disease

## 2018-07-16 ENCOUNTER — Telehealth: Payer: Self-pay | Admitting: Emergency Medicine
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
## 2018-07-16 NOTE — Telephone Encounter (Signed)
Pt called in inquire about Wellness visit on Monday 18th, if this would be in office or via phone. Please contact pt and let him know.

## 2018-07-17 ENCOUNTER — Other Ambulatory Visit: Payer: Self-pay

## 2018-07-17 ENCOUNTER — Ambulatory Visit (INDEPENDENT_AMBULATORY_CARE_PROVIDER_SITE_OTHER): Payer: Medicare Other | Admitting: *Deleted

## 2018-07-17 DIAGNOSIS — Z0000 Encounter for general adult medical examination without abnormal findings: Secondary | ICD-10-CM | POA: Diagnosis not present

## 2018-07-20 ENCOUNTER — Ambulatory Visit: Payer: Medicare Other

## 2018-07-20 ENCOUNTER — Other Ambulatory Visit (INDEPENDENT_AMBULATORY_CARE_PROVIDER_SITE_OTHER): Payer: Medicare Other

## 2018-07-20 DIAGNOSIS — Z0000 Encounter for general adult medical examination without abnormal findings: Secondary | ICD-10-CM | POA: Diagnosis not present

## 2018-07-20 DIAGNOSIS — R7302 Impaired glucose tolerance (oral): Secondary | ICD-10-CM | POA: Diagnosis not present

## 2018-07-20 DIAGNOSIS — E785 Hyperlipidemia, unspecified: Secondary | ICD-10-CM | POA: Diagnosis not present

## 2018-07-20 LAB — PSA: PSA: 1.13 ng/mL (ref 0.10–4.00)

## 2018-07-20 LAB — CBC WITH DIFFERENTIAL/PLATELET
Basophils Absolute: 0 10*3/uL (ref 0.0–0.1)
Basophils Relative: 0.6 % (ref 0.0–3.0)
Eosinophils Absolute: 0.2 10*3/uL (ref 0.0–0.7)
Eosinophils Relative: 3.4 % (ref 0.0–5.0)
HCT: 46.7 % (ref 39.0–52.0)
Hemoglobin: 16 g/dL (ref 13.0–17.0)
Lymphocytes Relative: 31.7 % (ref 12.0–46.0)
Lymphs Abs: 1.7 10*3/uL (ref 0.7–4.0)
MCHC: 34.2 g/dL (ref 30.0–36.0)
MCV: 91.7 fl (ref 78.0–100.0)
Monocytes Absolute: 0.7 10*3/uL (ref 0.1–1.0)
Monocytes Relative: 13.6 % — ABNORMAL HIGH (ref 3.0–12.0)
Neutro Abs: 2.7 10*3/uL (ref 1.4–7.7)
Neutrophils Relative %: 50.7 % (ref 43.0–77.0)
Platelets: 167 10*3/uL (ref 150.0–400.0)
RBC: 5.09 Mil/uL (ref 4.22–5.81)
RDW: 13.5 % (ref 11.5–15.5)
WBC: 5.2 10*3/uL (ref 4.0–10.5)

## 2018-07-20 LAB — MICROALBUMIN / CREATININE URINE RATIO
Creatinine,U: 122.2 mg/dL
Microalb Creat Ratio: 0.7 mg/g (ref 0.0–30.0)
Microalb, Ur: 0.9 mg/dL (ref 0.0–1.9)

## 2018-07-20 LAB — URINALYSIS, ROUTINE W REFLEX MICROSCOPIC
Bilirubin Urine: NEGATIVE
Hgb urine dipstick: NEGATIVE
Ketones, ur: NEGATIVE
Leukocytes,Ua: NEGATIVE
Nitrite: NEGATIVE
RBC / HPF: NONE SEEN (ref 0–?)
Specific Gravity, Urine: 1.025 (ref 1.000–1.030)
Total Protein, Urine: NEGATIVE
Urine Glucose: NEGATIVE
Urobilinogen, UA: 1 (ref 0.0–1.0)
pH: 6 (ref 5.0–8.0)

## 2018-07-20 LAB — BASIC METABOLIC PANEL
BUN: 14 mg/dL (ref 6–23)
CO2: 29 mEq/L (ref 19–32)
Calcium: 9 mg/dL (ref 8.4–10.5)
Chloride: 107 mEq/L (ref 96–112)
Creatinine, Ser: 0.73 mg/dL (ref 0.40–1.50)
GFR: 105.74 mL/min (ref >60.00)
Glucose, Bld: 120 mg/dL — ABNORMAL HIGH (ref 70–99)
Potassium: 5.1 mEq/L (ref 3.5–5.1)
Sodium: 144 mEq/L (ref 135–145)

## 2018-07-20 LAB — HEPATIC FUNCTION PANEL
ALT: 15 U/L (ref 0–53)
AST: 18 U/L (ref 0–37)
Albumin: 4.1 g/dL (ref 3.5–5.2)
Alkaline Phosphatase: 51 U/L (ref 39–117)
Bilirubin, Direct: 0.1 mg/dL (ref 0.0–0.3)
Total Bilirubin: 0.7 mg/dL (ref 0.2–1.2)
Total Protein: 6.1 g/dL (ref 6.0–8.3)

## 2018-07-20 LAB — LIPID PANEL
Cholesterol: 131 mg/dL (ref 0–200)
HDL: 35.9 mg/dL — ABNORMAL LOW (ref >39.00)
LDL Cholesterol: 58 mg/dL (ref 0–99)
NonHDL: 94.83
Total CHOL/HDL Ratio: 4
Triglycerides: 182 mg/dL — ABNORMAL HIGH (ref 0.0–149.0)
VLDL: 36.4 mg/dL (ref 0.0–40.0)

## 2018-07-20 LAB — HEMOGLOBIN A1C: Hgb A1c MFr Bld: 6.4 % (ref 4.6–6.5)

## 2018-07-20 LAB — TSH: TSH: 2.26 u[IU]/mL (ref 0.35–4.50)

## 2018-07-23 ENCOUNTER — Ambulatory Visit (INDEPENDENT_AMBULATORY_CARE_PROVIDER_SITE_OTHER): Payer: Medicare Other | Admitting: Internal Medicine

## 2018-07-23 ENCOUNTER — Encounter: Payer: Self-pay | Admitting: Internal Medicine

## 2018-07-23 ENCOUNTER — Other Ambulatory Visit: Payer: Self-pay

## 2018-07-23 VITALS — BP 124/82 | HR 95 | Temp 98.2°F | Ht 66.0 in | Wt 256.0 lb

## 2018-07-23 DIAGNOSIS — Z0000 Encounter for general adult medical examination without abnormal findings: Secondary | ICD-10-CM

## 2018-07-23 DIAGNOSIS — R7302 Impaired glucose tolerance (oral): Secondary | ICD-10-CM | POA: Diagnosis not present

## 2018-07-23 MED ORDER — MINOCYCLINE HCL 100 MG PO CAPS: 100.0000 mg | ORAL_CAPSULE | Freq: Two times a day (BID) | ORAL | 3 refills | Status: DC

## 2018-07-23 MED ORDER — TRAMADOL HCL 50 MG PO TABS: 50.0000 mg | ORAL_TABLET | Freq: Four times a day (QID) | ORAL | 2 refills | Status: DC | PRN
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
## 2018-07-23 NOTE — Patient Instructions (Signed)
Please continue all other medications as before, and refills have been done if requested.  Please have the pharmacy call with any other refills you may need.  Please continue your efforts at being more active, low cholesterol diet, and weight control.  You are otherwise up to date with prevention measures today.  Please keep your appointments with your specialists as you may have planned  Please return in 6 months, or sooner if needed, with Lab testing done 3-5 days before  

## 2018-07-25 ENCOUNTER — Encounter: Payer: Self-pay | Admitting: Internal Medicine

## 2018-07-25 NOTE — Assessment & Plan Note (Signed)
stable overall by history and exam, recent data reviewed with pt, and pt to continue medical treatment as before,  to f/u any worsening symptoms or concerns  

## 2018-07-25 NOTE — Assessment & Plan Note (Signed)

## 2018-08-31 ENCOUNTER — Telehealth: Payer: Self-pay | Admitting: Cardiovascular Disease

## 2018-08-31 DIAGNOSIS — I480 Paroxysmal atrial fibrillation: Secondary | ICD-10-CM

## 2018-08-31 NOTE — Telephone Encounter (Signed)
No message needed °

## 2018-08-31 NOTE — Telephone Encounter (Addendum)
New Message            Patient is calling in today to speak to someone about irregular heart beats,Patient is thinking he may need to change medication, not sure though. Pls advise. Thx again [employer]

## 2018-08-31 NOTE — Telephone Encounter (Signed)
Will route this communication back to the operator, to obtain more clarity of message taken and provided to triage on this pt.

## 2018-08-31 NOTE — Telephone Encounter (Signed)
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 However, he has had 4 episodes that occurred while he was getting up in the morning and a few while just sitting still. He also feels weak, SOB and headache during the fluttering. He said the other day his HR was 152 and BP: 115/87. He stated his events can last awhile, yesterday he felt the flutter for around 6 hours. He was taking tramadol and believes this could be part of the problem. However, he did not take any tramadol yesterday when he had the 6 hour event. He was also concerned when he rests his hands in his lap they go numb. His hands do not go numb when he rests them at his side.

## 2018-09-01 ENCOUNTER — Telehealth: Payer: Self-pay | Admitting: Radiology

## 2018-09-01 NOTE — Telephone Encounter (Signed)
Enrolled patient for a 14 day Zio monitor to be mailed. Brief instructions were gone over with the patient and he knows to expect the monitor to arrive in 3-4 days.  

## 2018-09-01 NOTE — Telephone Encounter (Signed)
Spoke with the patient's wife, she accepted having a monitor and an appointment with Orson Eva, NP on 09/03/18. She had no further questions.

## 2018-09-01 NOTE — Telephone Encounter (Signed)
He is on eliquis som protected from stroke Has had afib ablation may have recurrent PAF Get hime 2 week monitor and have him f/u with afib clinic or whoever did his ablation Don't think his arm symptoms have anything to do with heart

## 2018-09-03 ENCOUNTER — Other Ambulatory Visit: Payer: Self-pay

## 2018-09-03 ENCOUNTER — Ambulatory Visit (HOSPITAL_COMMUNITY)
Admission: RE | Admit: 2018-09-03 | Discharge: 2018-09-03 | Disposition: A | Discharge status: Home / Self Care | Payer: Medicare Other | Source: Ambulatory Visit | Attending: Nurse Practitioner | Admitting: Nurse Practitioner

## 2018-09-03 ENCOUNTER — Encounter (HOSPITAL_COMMUNITY): Payer: Self-pay | Admitting: Nurse Practitioner

## 2018-09-03 VITALS — BP 132/82 | HR 72 | Ht 66.0 in | Wt 261.0 lb

## 2018-09-03 DIAGNOSIS — Z8249 Family history of ischemic heart disease and other diseases of the circulatory system: Secondary | ICD-10-CM | POA: Diagnosis not present

## 2018-09-03 DIAGNOSIS — R002 Palpitations: Secondary | ICD-10-CM

## 2018-09-03 DIAGNOSIS — Z7951 Long term (current) use of inhaled steroids: Secondary | ICD-10-CM | POA: Diagnosis not present

## 2018-09-03 DIAGNOSIS — E119 Type 2 diabetes mellitus without complications: Secondary | ICD-10-CM | POA: Insufficient documentation

## 2018-09-03 DIAGNOSIS — Z8711 Personal history of peptic ulcer disease: Secondary | ICD-10-CM | POA: Diagnosis not present

## 2018-09-03 DIAGNOSIS — Z87442 Personal history of urinary calculi: Secondary | ICD-10-CM | POA: Insufficient documentation

## 2018-09-03 DIAGNOSIS — Z823 Family history of stroke: Secondary | ICD-10-CM | POA: Diagnosis not present

## 2018-09-03 DIAGNOSIS — Z96651 Presence of right artificial knee joint: Secondary | ICD-10-CM | POA: Diagnosis not present

## 2018-09-03 DIAGNOSIS — Z7901 Long term (current) use of anticoagulants: Secondary | ICD-10-CM | POA: Diagnosis not present

## 2018-09-03 DIAGNOSIS — I4892 Unspecified atrial flutter: Secondary | ICD-10-CM | POA: Diagnosis present

## 2018-09-03 DIAGNOSIS — Z79899 Other long term (current) drug therapy: Secondary | ICD-10-CM | POA: Insufficient documentation

## 2018-09-03 DIAGNOSIS — G4733 Obstructive sleep apnea (adult) (pediatric): Secondary | ICD-10-CM | POA: Diagnosis not present

## 2018-09-03 DIAGNOSIS — Z85828 Personal history of other malignant neoplasm of skin: Secondary | ICD-10-CM | POA: Diagnosis not present

## 2018-09-03 MED ORDER — METOPROLOL TARTRATE 25 MG PO TABS: 25.0000 mg | ORAL_TABLET | Freq: Two times a day (BID) | ORAL | 2 refills | Status: DC

## 2018-09-03 MED ORDER — APIXABAN 5 MG PO TABS: 5.0000 mg | ORAL_TABLET | Freq: Two times a day (BID) | ORAL | 3 refills | Status: DC

## 2018-09-03 NOTE — Patient Instructions (Signed)
Increase metoprolol to 25mg  twice a day  Increase eliquis to 5mg  twice a day
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
## 2018-09-07 ENCOUNTER — Other Ambulatory Visit (INDEPENDENT_AMBULATORY_CARE_PROVIDER_SITE_OTHER): Payer: Medicare Other

## 2018-09-07 DIAGNOSIS — I480 Paroxysmal atrial fibrillation: Secondary | ICD-10-CM | POA: Diagnosis not present

## 2018-09-16 ENCOUNTER — Other Ambulatory Visit: Payer: Self-pay

## 2018-09-16 ENCOUNTER — Ambulatory Visit (HOSPITAL_COMMUNITY)
Admission: RE | Admit: 2018-09-16 | Discharge: 2018-09-16 | Disposition: A | Discharge status: Home / Self Care | Payer: Medicare Other | Source: Ambulatory Visit | Attending: Nurse Practitioner | Admitting: Nurse Practitioner

## 2018-09-16 ENCOUNTER — Encounter (HOSPITAL_COMMUNITY): Payer: Self-pay | Admitting: Nurse Practitioner

## 2018-09-16 VITALS — BP 108/70 | HR 67 | Ht 66.0 in | Wt 257.0 lb

## 2018-09-16 DIAGNOSIS — I4892 Unspecified atrial flutter: Secondary | ICD-10-CM | POA: Diagnosis not present

## 2018-09-16 DIAGNOSIS — Z7901 Long term (current) use of anticoagulants: Secondary | ICD-10-CM | POA: Diagnosis not present

## 2018-09-16 DIAGNOSIS — M1610 Unilateral primary osteoarthritis, unspecified hip: Secondary | ICD-10-CM | POA: Insufficient documentation

## 2018-09-16 DIAGNOSIS — Z888 Allergy status to other drugs, medicaments and biological substances status: Secondary | ICD-10-CM | POA: Diagnosis not present

## 2018-09-16 DIAGNOSIS — Z96651 Presence of right artificial knee joint: Secondary | ICD-10-CM | POA: Diagnosis not present

## 2018-09-16 DIAGNOSIS — Z833 Family history of diabetes mellitus: Secondary | ICD-10-CM | POA: Insufficient documentation

## 2018-09-16 DIAGNOSIS — R7302 Impaired glucose tolerance (oral): Secondary | ICD-10-CM | POA: Diagnosis present

## 2018-09-16 DIAGNOSIS — E785 Hyperlipidemia, unspecified: Secondary | ICD-10-CM | POA: Diagnosis not present

## 2018-09-16 DIAGNOSIS — I480 Paroxysmal atrial fibrillation: Secondary | ICD-10-CM | POA: Diagnosis not present

## 2018-09-16 DIAGNOSIS — N529 Male erectile dysfunction, unspecified: Secondary | ICD-10-CM | POA: Diagnosis not present

## 2018-09-16 DIAGNOSIS — Z9049 Acquired absence of other specified parts of digestive tract: Secondary | ICD-10-CM | POA: Insufficient documentation

## 2018-09-16 DIAGNOSIS — Z8249 Family history of ischemic heart disease and other diseases of the circulatory system: Secondary | ICD-10-CM | POA: Insufficient documentation

## 2018-09-16 DIAGNOSIS — E119 Type 2 diabetes mellitus without complications: Secondary | ICD-10-CM | POA: Insufficient documentation

## 2018-09-16 DIAGNOSIS — G4733 Obstructive sleep apnea (adult) (pediatric): Secondary | ICD-10-CM | POA: Diagnosis not present

## 2018-09-16 DIAGNOSIS — Z85828 Personal history of other malignant neoplasm of skin: Secondary | ICD-10-CM | POA: Diagnosis not present

## 2018-09-16 DIAGNOSIS — M47812 Spondylosis without myelopathy or radiculopathy, cervical region: Secondary | ICD-10-CM | POA: Diagnosis not present

## 2018-09-16 DIAGNOSIS — Z79899 Other long term (current) drug therapy: Secondary | ICD-10-CM | POA: Insufficient documentation

## 2018-09-16 MED ORDER — METOPROLOL TARTRATE 25 MG PO TABS: 12.5000 mg | ORAL_TABLET | Freq: Two times a day (BID) | ORAL | 2 refills | Status: DC
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
## 2018-10-01 ENCOUNTER — Other Ambulatory Visit: Payer: Self-pay
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
## 2018-11-04 ENCOUNTER — Ambulatory Visit (INDEPENDENT_AMBULATORY_CARE_PROVIDER_SITE_OTHER): Payer: Medicare Other | Admitting: Cardiovascular Disease

## 2018-11-04 ENCOUNTER — Encounter: Payer: Self-pay | Admitting: Cardiovascular Disease

## 2018-11-04 ENCOUNTER — Other Ambulatory Visit: Payer: Self-pay

## 2018-11-04 VITALS — BP 110/70 | HR 65 | Ht 66.0 in | Wt 258.8 lb

## 2018-11-04 DIAGNOSIS — I480 Paroxysmal atrial fibrillation: Secondary | ICD-10-CM

## 2018-11-04 MED ORDER — FLECAINIDE ACETATE 150 MG PO TABS: ORAL_TABLET | ORAL | 1 refills | Status: DC

## 2018-11-04 NOTE — Patient Instructions (Signed)
Your physician has recommended you make the following change in your medication: FLECAINIDE 300 MG AS NEEDED FOR AFIB  Your physician wants you to follow-up in: [place] will receive a reminder letter in the mail two months in advance. If you don't receive a letter, please call our office to schedule the follow-up appointment.

## 2018-12-23 ENCOUNTER — Other Ambulatory Visit: Payer: Self-pay | Admitting: Internal Medicine

## 2018-12-24 ENCOUNTER — Other Ambulatory Visit: Payer: Self-pay | Admitting: Cardiovascular Disease

## 2018-12-25 MED ORDER — METOPROLOL TARTRATE 25 MG PO TABS: 12.5000 mg | ORAL_TABLET | Freq: Two times a day (BID) | ORAL | 3 refills | Status: DC

## 2019-01-19 ENCOUNTER — Emergency Department (HOSPITAL_COMMUNITY): Payer: Medicare Other

## 2019-01-19 ENCOUNTER — Other Ambulatory Visit: Payer: Self-pay

## 2019-01-19 ENCOUNTER — Inpatient Hospital Stay (HOSPITAL_COMMUNITY)
Admission: EM | Admit: 2019-01-19 | Discharge: 2019-01-22 | DRG: 467 | Disposition: A | Discharge status: Home Health | Payer: Medicare Other | Attending: Orthopedic Surgery | Admitting: Orthopedic Surgery

## 2019-01-19 ENCOUNTER — Encounter (HOSPITAL_COMMUNITY): Payer: Self-pay

## 2019-01-19 DIAGNOSIS — I4891 Unspecified atrial fibrillation: Secondary | ICD-10-CM | POA: Diagnosis not present

## 2019-01-19 DIAGNOSIS — Z96651 Presence of right artificial knee joint: Secondary | ICD-10-CM | POA: Diagnosis not present

## 2019-01-19 DIAGNOSIS — T8453XA Infection and inflammatory reaction due to internal right knee prosthesis, initial encounter: Secondary | ICD-10-CM | POA: Diagnosis present

## 2019-01-19 DIAGNOSIS — M009 Pyogenic arthritis, unspecified: Secondary | ICD-10-CM | POA: Diagnosis present

## 2019-01-19 DIAGNOSIS — I471 Supraventricular tachycardia: Secondary | ICD-10-CM | POA: Diagnosis not present

## 2019-01-19 DIAGNOSIS — Z6835 Body mass index (BMI) 35.0-35.9, adult: Secondary | ICD-10-CM | POA: Diagnosis not present

## 2019-01-19 DIAGNOSIS — E119 Type 2 diabetes mellitus without complications: Secondary | ICD-10-CM | POA: Diagnosis present

## 2019-01-19 DIAGNOSIS — B957 Other staphylococcus as the cause of diseases classified elsewhere: Secondary | ICD-10-CM | POA: Diagnosis present

## 2019-01-19 DIAGNOSIS — Z833 Family history of diabetes mellitus: Secondary | ICD-10-CM | POA: Diagnosis not present

## 2019-01-19 DIAGNOSIS — Z8614 Personal history of Methicillin resistant Staphylococcus aureus infection: Secondary | ICD-10-CM | POA: Diagnosis not present

## 2019-01-19 DIAGNOSIS — K219 Gastro-esophageal reflux disease without esophagitis: Secondary | ICD-10-CM | POA: Diagnosis present

## 2019-01-19 DIAGNOSIS — Z823 Family history of stroke: Secondary | ICD-10-CM | POA: Diagnosis not present

## 2019-01-19 DIAGNOSIS — Z7901 Long term (current) use of anticoagulants: Secondary | ICD-10-CM | POA: Diagnosis not present

## 2019-01-19 DIAGNOSIS — Z8249 Family history of ischemic heart disease and other diseases of the circulatory system: Secondary | ICD-10-CM

## 2019-01-19 DIAGNOSIS — I480 Paroxysmal atrial fibrillation: Secondary | ICD-10-CM | POA: Diagnosis present

## 2019-01-19 DIAGNOSIS — T8453XD Infection and inflammatory reaction due to internal right knee prosthesis, subsequent encounter: Secondary | ICD-10-CM

## 2019-01-19 DIAGNOSIS — Z20828 Contact with and (suspected) exposure to other viral communicable diseases: Secondary | ICD-10-CM | POA: Diagnosis present

## 2019-01-19 DIAGNOSIS — L814 Other melanin hyperpigmentation: Secondary | ICD-10-CM | POA: Diagnosis not present

## 2019-01-19 DIAGNOSIS — Z91048 Other nonmedicinal substance allergy status: Secondary | ICD-10-CM | POA: Diagnosis not present

## 2019-01-19 DIAGNOSIS — Y831 Surgical operation with implant of artificial internal device as the cause of abnormal reaction of the patient, or of later complication, without mention of misadventure at the time of the procedure: Secondary | ICD-10-CM | POA: Diagnosis present

## 2019-01-19 DIAGNOSIS — D62 Acute posthemorrhagic anemia: Secondary | ICD-10-CM | POA: Diagnosis not present

## 2019-01-19 DIAGNOSIS — Z792 Long term (current) use of antibiotics: Secondary | ICD-10-CM | POA: Diagnosis not present

## 2019-01-19 DIAGNOSIS — G4733 Obstructive sleep apnea (adult) (pediatric): Secondary | ICD-10-CM | POA: Diagnosis present

## 2019-01-19 DIAGNOSIS — B9562 Methicillin resistant Staphylococcus aureus infection as the cause of diseases classified elsewhere: Secondary | ICD-10-CM | POA: Diagnosis not present

## 2019-01-19 DIAGNOSIS — M00061 Staphylococcal arthritis, right knee: Secondary | ICD-10-CM | POA: Diagnosis not present

## 2019-01-19 LAB — BASIC METABOLIC PANEL
Anion gap: 10 (ref 5–15)
BUN: 12 mg/dL (ref 8–23)
CO2: 26 mmol/L (ref 22–32)
Calcium: 9.1 mg/dL (ref 8.9–10.3)
Chloride: 102 mmol/L (ref 98–111)
Creatinine, Ser: 0.85 mg/dL (ref 0.61–1.24)
GFR calc Af Amer: >60 mL/min (ref >60)
GFR calc non Af Amer: >60 mL/min (ref >60)
Glucose, Bld: 126 mg/dL — ABNORMAL HIGH (ref 70–99)
Potassium: 4.3 mmol/L (ref 3.5–5.1)
Sodium: 138 mmol/L (ref 135–145)

## 2019-01-19 LAB — CBC WITH DIFFERENTIAL/PLATELET
Abs Immature Granulocytes: 0.04 10*3/uL (ref 0.00–0.07)
Basophils Absolute: 0 10*3/uL (ref 0.0–0.1)
Basophils Relative: 0 %
Eosinophils Absolute: 0 10*3/uL (ref 0.0–0.5)
Eosinophils Relative: 0 %
HCT: 49.5 % (ref 39.0–52.0)
Hemoglobin: 16.1 g/dL (ref 13.0–17.0)
Immature Granulocytes: 0 %
Lymphocytes Relative: 10 %
Lymphs Abs: 1.4 10*3/uL (ref 0.7–4.0)
MCH: 31.1 pg (ref 26.0–34.0)
MCHC: 32.5 g/dL (ref 30.0–36.0)
MCV: 95.6 fL (ref 80.0–100.0)
Monocytes Absolute: 2 10*3/uL — ABNORMAL HIGH (ref 0.1–1.0)
Monocytes Relative: 14 %
Neutro Abs: 10.8 10*3/uL — ABNORMAL HIGH (ref 1.7–7.7)
Neutrophils Relative %: 76 %
Platelets: 183 10*3/uL (ref 150–400)
RBC: 5.18 MIL/uL (ref 4.22–5.81)
RDW: 13 % (ref 11.5–15.5)
WBC: 14.3 10*3/uL — ABNORMAL HIGH (ref 4.0–10.5)
nRBC: 0 % (ref 0.0–0.2)

## 2019-01-19 LAB — SYNOVIAL CELL COUNT + DIFF, W/ CRYSTALS
Crystals, Fluid: NONE SEEN
Eosinophils-Synovial: 0 % (ref 0–1)
Lymphocytes-Synovial Fld: 17 % (ref 0–20)
Monocyte-Macrophage-Synovial Fluid: 4 % — ABNORMAL LOW (ref 50–90)
Neutrophil, Synovial: 79 % — ABNORMAL HIGH (ref 0–25)
WBC, Synovial: 16590 /mm3 — ABNORMAL HIGH (ref 0–200)

## 2019-01-19 LAB — LACTIC ACID, PLASMA: Lactic Acid, Venous: 1.6 mmol/L (ref 0.5–1.9)

## 2019-01-19 MED ORDER — METHOCARBAMOL 1000 MG/10ML IJ SOLN
500.0000 mg | Freq: Four times a day (QID) | INTRAVENOUS | Status: DC | PRN
  Filled 2019-01-19: qty 5

## 2019-01-19 MED ORDER — ZOLPIDEM TARTRATE 5 MG PO TABS: 5.0000 mg | ORAL_TABLET | Freq: Every evening | ORAL | Status: DC | PRN

## 2019-01-19 MED ORDER — ROSUVASTATIN CALCIUM 10 MG PO TABS
10.0000 mg | ORAL_TABLET | Freq: Every day | ORAL | Status: DC
  Administered 2019-01-21 – 2019-01-22 (×2): 10 mg via ORAL
  Filled 2019-01-19 (×2): qty 1

## 2019-01-19 MED ORDER — ACETAMINOPHEN 325 MG PO TABS
325.0000 mg | ORAL_TABLET | Freq: Four times a day (QID) | ORAL | Status: DC | PRN
  Administered 2019-01-21 (×2): 650 mg via ORAL
  Filled 2019-01-19 (×2): qty 2

## 2019-01-19 MED ORDER — LIDOCAINE HCL (PF) 1 % IJ SOLN
30.0000 mL | Freq: Once | INTRAMUSCULAR | Status: AC
  Administered 2019-01-19: 30 mL
  Filled 2019-01-19: qty 30

## 2019-01-19 MED ORDER — ONDANSETRON HCL 4 MG PO TABS: 4.0000 mg | ORAL_TABLET | Freq: Four times a day (QID) | ORAL | Status: DC | PRN

## 2019-01-19 MED ORDER — ONDANSETRON HCL 4 MG/2ML IJ SOLN: 4.0000 mg | Freq: Four times a day (QID) | INTRAMUSCULAR | Status: DC | PRN

## 2019-01-19 MED ORDER — SODIUM CHLORIDE 0.9 % IV SOLN
INTRAVENOUS | Status: DC
  Administered 2019-01-20 – 2019-01-21 (×2): via INTRAVENOUS

## 2019-01-19 MED ORDER — TAMSULOSIN HCL 0.4 MG PO CAPS
0.4000 mg | ORAL_CAPSULE | Freq: Every day | ORAL | Status: DC
  Administered 2019-01-21 – 2019-01-22 (×2): 0.4 mg via ORAL
  Filled 2019-01-19 (×3): qty 1

## 2019-01-19 MED ORDER — HYDROMORPHONE HCL 1 MG/ML IJ SOLN: 0.5000 mg | INTRAMUSCULAR | Status: DC | PRN

## 2019-01-19 MED ORDER — METOPROLOL TARTRATE 12.5 MG HALF TABLET
12.5000 mg | ORAL_TABLET | Freq: Two times a day (BID) | ORAL | Status: DC
  Administered 2019-01-20 – 2019-01-22 (×4): 12.5 mg via ORAL
  Filled 2019-01-19 (×3): qty 1

## 2019-01-19 MED ORDER — VANCOMYCIN HCL 10 G IV SOLR
2000.0000 mg | Freq: Once | INTRAVENOUS | Status: AC
  Administered 2019-01-19: 2000 mg via INTRAVENOUS
  Filled 2019-01-19: qty 2000

## 2019-01-19 MED ORDER — METHOCARBAMOL 500 MG PO TABS: 500.0000 mg | ORAL_TABLET | Freq: Four times a day (QID) | ORAL | Status: DC | PRN

## 2019-01-19 MED ORDER — OXYCODONE HCL 5 MG PO TABS
5.0000 mg | ORAL_TABLET | ORAL | Status: DC | PRN
  Administered 2019-01-20 (×2): 5 mg via ORAL
  Administered 2019-01-22: 10 mg via ORAL
  Filled 2019-01-19: qty 2
  Filled 2019-01-19 (×2): qty 1

## 2019-01-19 NOTE — ED Triage Notes (Signed)
Pt arrives POV from home with complaints of right knee pain since yesterday. Pt denies injury or trauma. Pt has hx of knee replacement in that knee. Pt reports swelling in the knee. Pt reports he is unable to bear weight on the knee. Pt reports elevated temp of 101.0 last night. Pt is afebrile in triage.
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
## 2019-01-19 NOTE — Progress Notes (Signed)
A consult was received from an ED physician for vancomycin per pharmacy dosing.  The patient's profile has been reviewed for ht/wt/allergies/indication/available labs.   A one time order has been placed for Vancomycin 2gm iv x1.  Further antibiotics/pharmacy consults should be ordered by admitting physician if indicated.                       Thank you, Nani Skillern Crowford 01/19/2019  9:21 PM

## 2019-01-19 NOTE — ED Notes (Signed)
120 mL synovial fluid aspirated from right knee
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
## 2019-01-20 ENCOUNTER — Inpatient Hospital Stay (HOSPITAL_COMMUNITY): Payer: Medicare Other | Admitting: Anesthesiology

## 2019-01-20 ENCOUNTER — Encounter (HOSPITAL_COMMUNITY): Payer: Self-pay | Admitting: *Deleted

## 2019-01-20 ENCOUNTER — Encounter (HOSPITAL_COMMUNITY)
Admission: EM | Disposition: A | Discharge status: Home Health | Payer: Self-pay | Source: Home / Self Care | Attending: Orthopedic Surgery

## 2019-01-20 DIAGNOSIS — T8453XA Infection and inflammatory reaction due to internal right knee prosthesis, initial encounter: Secondary | ICD-10-CM

## 2019-01-20 HISTORY — PX: TOTAL KNEE REVISION: SHX996

## 2019-01-20 LAB — CBC
HCT: 44 % (ref 39.0–52.0)
Hemoglobin: 14.3 g/dL (ref 13.0–17.0)
MCH: 31 pg (ref 26.0–34.0)
MCHC: 32.5 g/dL (ref 30.0–36.0)
MCV: 95.4 fL (ref 80.0–100.0)
Platelets: 165 10*3/uL (ref 150–400)
RBC: 4.61 MIL/uL (ref 4.22–5.81)
RDW: 13 % (ref 11.5–15.5)
WBC: 12.1 10*3/uL — ABNORMAL HIGH (ref 4.0–10.5)
nRBC: 0 % (ref 0.0–0.2)

## 2019-01-20 LAB — C-REACTIVE PROTEIN: CRP: 18.4 mg/dL — ABNORMAL HIGH (ref <1.0)

## 2019-01-20 LAB — SARS CORONAVIRUS 2 (TAT 6-24 HRS): SARS Coronavirus 2: NEGATIVE

## 2019-01-20 LAB — GRAM STAIN: Special Requests: NORMAL

## 2019-01-20 LAB — SEDIMENTATION RATE: Sed Rate: 12 mm/hr (ref 0–16)

## 2019-01-20 LAB — SURGICAL PCR SCREEN
MRSA, PCR: NEGATIVE
Staphylococcus aureus: NEGATIVE

## 2019-01-20 LAB — GLUCOSE, CAPILLARY: Glucose-Capillary: 135 mg/dL — ABNORMAL HIGH (ref 70–99)

## 2019-01-20 SURGERY — TOTAL KNEE REVISION
Anesthesia: General | Site: Knee | Laterality: Right

## 2019-01-20 MED ORDER — SODIUM CHLORIDE 0.9 % IR SOLN
Status: DC | PRN
  Administered 2019-01-20: 1000 mL

## 2019-01-20 MED ORDER — SODIUM CHLORIDE (PF) 0.9 % IJ SOLN
INTRAMUSCULAR | Status: DC | PRN
  Administered 2019-01-20: 50 mL

## 2019-01-20 MED ORDER — MIDAZOLAM HCL 2 MG/2ML IJ SOLN
1.0000 mg | INTRAMUSCULAR | Status: DC
  Filled 2019-01-20: qty 2

## 2019-01-20 MED ORDER — METOCLOPRAMIDE HCL 5 MG PO TABS: 5.0000 mg | ORAL_TABLET | Freq: Three times a day (TID) | ORAL | Status: DC | PRN

## 2019-01-20 MED ORDER — HYDROMORPHONE HCL 1 MG/ML IJ SOLN: 0.5000 mg | INTRAMUSCULAR | Status: DC | PRN

## 2019-01-20 MED ORDER — KCL IN DEXTROSE-NACL 20-5-0.45 MEQ/L-%-% IV SOLN
INTRAVENOUS | Status: DC
  Administered 2019-01-20 – 2019-01-21 (×2): via INTRAVENOUS
  Filled 2019-01-20 (×2): qty 1000

## 2019-01-20 MED ORDER — CHLORHEXIDINE GLUCONATE CLOTH 2 % EX PADS
6.0000 | MEDICATED_PAD | Freq: Every day | CUTANEOUS | Status: DC
  Administered 2019-01-20 – 2019-01-22 (×3): 6 via TOPICAL

## 2019-01-20 MED ORDER — VANCOMYCIN HCL 1000 MG IV SOLR
INTRAVENOUS | Status: AC
  Filled 2019-01-20: qty 1000

## 2019-01-20 MED ORDER — GABAPENTIN 100 MG PO CAPS
100.0000 mg | ORAL_CAPSULE | Freq: Three times a day (TID) | ORAL | Status: DC
  Administered 2019-01-20 – 2019-01-22 (×7): 100 mg via ORAL
  Filled 2019-01-20 (×7): qty 1

## 2019-01-20 MED ORDER — FENTANYL CITRATE (PF) 100 MCG/2ML IJ SOLN
25.0000 ug | INTRAMUSCULAR | Status: DC | PRN
  Administered 2019-01-20 (×2): 50 ug via INTRAVENOUS

## 2019-01-20 MED ORDER — ONDANSETRON HCL 4 MG/2ML IJ SOLN: 4.0000 mg | Freq: Four times a day (QID) | INTRAMUSCULAR | Status: DC | PRN

## 2019-01-20 MED ORDER — OXYCODONE HCL 5 MG PO TABS: 5.0000 mg | ORAL_TABLET | Freq: Once | ORAL | Status: DC | PRN

## 2019-01-20 MED ORDER — TRANEXAMIC ACID-NACL 1000-0.7 MG/100ML-% IV SOLN
1000.0000 mg | INTRAVENOUS | Status: AC
  Administered 2019-01-20: 1000 mg via INTRAVENOUS
  Filled 2019-01-20: qty 100

## 2019-01-20 MED ORDER — PROPOFOL 10 MG/ML IV BOLUS
INTRAVENOUS | Status: AC
  Filled 2019-01-20: qty 20

## 2019-01-20 MED ORDER — ONDANSETRON HCL 4 MG PO TABS: 4.0000 mg | ORAL_TABLET | Freq: Four times a day (QID) | ORAL | Status: DC | PRN

## 2019-01-20 MED ORDER — METOPROLOL TARTRATE 12.5 MG HALF TABLET
12.5000 mg | ORAL_TABLET | Freq: Once | ORAL | Status: AC
  Administered 2019-01-21: 12.5 mg via ORAL
  Filled 2019-01-20 (×2): qty 1

## 2019-01-20 MED ORDER — TRANEXAMIC ACID-NACL 1000-0.7 MG/100ML-% IV SOLN
INTRAVENOUS | Status: AC
  Filled 2019-01-20: qty 100

## 2019-01-20 MED ORDER — TIZANIDINE HCL 2 MG PO TABS: 2.0000 mg | ORAL_TABLET | Freq: Four times a day (QID) | ORAL | 0 refills | Status: DC | PRN

## 2019-01-20 MED ORDER — SODIUM CHLORIDE (PF) 0.9 % IJ SOLN
INTRAMUSCULAR | Status: AC
  Filled 2019-01-20: qty 50

## 2019-01-20 MED ORDER — FENTANYL CITRATE (PF) 250 MCG/5ML IJ SOLN
INTRAMUSCULAR | Status: DC | PRN
  Administered 2019-01-20: 25 ug via INTRAVENOUS
  Administered 2019-01-20 (×2): 50 ug via INTRAVENOUS
  Administered 2019-01-20 (×2): 25 ug via INTRAVENOUS

## 2019-01-20 MED ORDER — ACETAMINOPHEN 325 MG PO TABS: 325.0000 mg | ORAL_TABLET | Freq: Four times a day (QID) | ORAL | Status: DC | PRN

## 2019-01-20 MED ORDER — VANCOMYCIN HCL 1000 MG IV SOLR
INTRAVENOUS | Status: DC | PRN
  Administered 2019-01-20: 1000 mg

## 2019-01-20 MED ORDER — METHOCARBAMOL 500 MG IVPB - SIMPLE MED
500.0000 mg | Freq: Four times a day (QID) | INTRAVENOUS | Status: DC | PRN
  Filled 2019-01-20: qty 50

## 2019-01-20 MED ORDER — FLEET ENEMA 7-19 GM/118ML RE ENEM: 1.0000 | ENEMA | Freq: Once | RECTAL | Status: DC | PRN

## 2019-01-20 MED ORDER — BUPIVACAINE LIPOSOME 1.3 % IJ SUSP
20.0000 mL | Freq: Once | INTRAMUSCULAR | Status: AC
  Administered 2019-01-20: 20 mL
  Filled 2019-01-20: qty 20

## 2019-01-20 MED ORDER — FENTANYL CITRATE (PF) 100 MCG/2ML IJ SOLN
50.0000 ug | INTRAMUSCULAR | Status: DC
  Filled 2019-01-20: qty 2

## 2019-01-20 MED ORDER — ONDANSETRON HCL 4 MG/2ML IJ SOLN
INTRAMUSCULAR | Status: DC | PRN
  Administered 2019-01-20: 4 mg via INTRAVENOUS

## 2019-01-20 MED ORDER — BUPIVACAINE-EPINEPHRINE 0.25% -1:200000 IJ SOLN
INTRAMUSCULAR | Status: AC
  Filled 2019-01-20: qty 1

## 2019-01-20 MED ORDER — DOCUSATE SODIUM 100 MG PO CAPS
100.0000 mg | ORAL_CAPSULE | Freq: Two times a day (BID) | ORAL | Status: DC
  Administered 2019-01-20 – 2019-01-22 (×4): 100 mg via ORAL
  Filled 2019-01-20 (×5): qty 1

## 2019-01-20 MED ORDER — FENTANYL CITRATE (PF) 100 MCG/2ML IJ SOLN
INTRAMUSCULAR | Status: AC
  Filled 2019-01-20: qty 2

## 2019-01-20 MED ORDER — HYDROGEN PEROXIDE 3 % EX SOLN
CUTANEOUS | Status: AC
  Filled 2019-01-20: qty 473

## 2019-01-20 MED ORDER — OXYCODONE-ACETAMINOPHEN 5-325 MG PO TABS: 1.0000 | ORAL_TABLET | ORAL | 0 refills | Status: DC | PRN

## 2019-01-20 MED ORDER — METHOCARBAMOL 500 MG IVPB - SIMPLE MED
INTRAVENOUS | Status: AC
  Filled 2019-01-20: qty 50

## 2019-01-20 MED ORDER — ALUM & MAG HYDROXIDE-SIMETH 200-200-20 MG/5ML PO SUSP: 30.0000 mL | ORAL | Status: DC | PRN

## 2019-01-20 MED ORDER — OXYCODONE HCL 5 MG PO TABS: 5.0000 mg | ORAL_TABLET | ORAL | Status: DC | PRN

## 2019-01-20 MED ORDER — PANTOPRAZOLE SODIUM 40 MG PO TBEC
40.0000 mg | DELAYED_RELEASE_TABLET | Freq: Every day | ORAL | Status: DC
  Administered 2019-01-20 – 2019-01-22 (×3): 40 mg via ORAL
  Filled 2019-01-20 (×3): qty 1

## 2019-01-20 MED ORDER — TOBRAMYCIN SULFATE 1.2 G IJ SOLR
INTRAMUSCULAR | Status: DC | PRN
  Administered 2019-01-20: 1.2 g

## 2019-01-20 MED ORDER — ONDANSETRON HCL 4 MG/2ML IJ SOLN: 4.0000 mg | Freq: Once | INTRAMUSCULAR | Status: DC | PRN

## 2019-01-20 MED ORDER — TRANEXAMIC ACID-NACL 1000-0.7 MG/100ML-% IV SOLN: 1000.0000 mg | INTRAVENOUS | Status: DC

## 2019-01-20 MED ORDER — PHENOL 1.4 % MT LIQD: 1.0000 | OROMUCOSAL | Status: DC | PRN

## 2019-01-20 MED ORDER — MENTHOL 3 MG MT LOZG: 1.0000 | LOZENGE | OROMUCOSAL | Status: DC | PRN

## 2019-01-20 MED ORDER — BISACODYL 5 MG PO TBEC: 5.0000 mg | DELAYED_RELEASE_TABLET | Freq: Every day | ORAL | Status: DC | PRN

## 2019-01-20 MED ORDER — APIXABAN 2.5 MG PO TABS: 2.5000 mg | ORAL_TABLET | Freq: Two times a day (BID) | ORAL | 0 refills | Status: DC

## 2019-01-20 MED ORDER — DIPHENHYDRAMINE HCL 12.5 MG/5ML PO ELIX: 12.5000 mg | ORAL_SOLUTION | ORAL | Status: DC | PRN

## 2019-01-20 MED ORDER — LACTATED RINGERS IV SOLN
INTRAVENOUS | Status: DC
  Administered 2019-01-20 (×2): via INTRAVENOUS

## 2019-01-20 MED ORDER — METHOCARBAMOL 500 MG PO TABS: 500.0000 mg | ORAL_TABLET | Freq: Four times a day (QID) | ORAL | Status: DC | PRN

## 2019-01-20 MED ORDER — TOBRAMYCIN SULFATE 1.2 G IJ SOLR
INTRAMUSCULAR | Status: AC
  Filled 2019-01-20: qty 1.2

## 2019-01-20 MED ORDER — CELECOXIB 200 MG PO CAPS
200.0000 mg | ORAL_CAPSULE | Freq: Two times a day (BID) | ORAL | Status: DC
  Administered 2019-01-20 – 2019-01-22 (×4): 200 mg via ORAL
  Filled 2019-01-20 (×4): qty 1

## 2019-01-20 MED ORDER — TRANEXAMIC ACID-NACL 1000-0.7 MG/100ML-% IV SOLN
1000.0000 mg | Freq: Once | INTRAVENOUS | Status: AC
  Administered 2019-01-20: 1000 mg via INTRAVENOUS
  Filled 2019-01-20: qty 100

## 2019-01-20 MED ORDER — ROPIVACAINE HCL 7.5 MG/ML IJ SOLN
INTRAMUSCULAR | Status: DC | PRN
  Administered 2019-01-20: 20 mL via PERINEURAL

## 2019-01-20 MED ORDER — MIDAZOLAM HCL 2 MG/2ML IJ SOLN
INTRAMUSCULAR | Status: AC
  Filled 2019-01-20: qty 2

## 2019-01-20 MED ORDER — OXYCODONE HCL 5 MG/5ML PO SOLN: 5.0000 mg | Freq: Once | ORAL | Status: DC | PRN

## 2019-01-20 MED ORDER — APIXABAN 2.5 MG PO TABS
2.5000 mg | ORAL_TABLET | Freq: Two times a day (BID) | ORAL | Status: DC
  Administered 2019-01-21 – 2019-01-22 (×3): 2.5 mg via ORAL
  Filled 2019-01-20 (×3): qty 1

## 2019-01-20 MED ORDER — FENTANYL CITRATE (PF) 250 MCG/5ML IJ SOLN
INTRAMUSCULAR | Status: AC
  Filled 2019-01-20: qty 5

## 2019-01-20 MED ORDER — VANCOMYCIN HCL 10 G IV SOLR
1750.0000 mg | INTRAVENOUS | Status: DC
  Administered 2019-01-20 – 2019-01-21 (×2): 1750 mg via INTRAVENOUS
  Filled 2019-01-20 (×2): qty 1750

## 2019-01-20 MED ORDER — SODIUM CHLORIDE 0.9 % IR SOLN
Status: DC | PRN
  Administered 2019-01-20: 3000 mL

## 2019-01-20 MED ORDER — BUPIVACAINE-EPINEPHRINE 0.25% -1:200000 IJ SOLN
INTRAMUSCULAR | Status: DC | PRN
  Administered 2019-01-20: 30 mL

## 2019-01-20 MED ORDER — TRANEXAMIC ACID 1000 MG/10ML IV SOLN
2000.0000 mg | Freq: Once | INTRAVENOUS | Status: AC
  Administered 2019-01-20: 2000 mg via TOPICAL
  Filled 2019-01-20: qty 20

## 2019-01-20 MED ORDER — HYDROGEN PEROXIDE 3 % EX SOLN
CUTANEOUS | Status: DC | PRN
  Administered 2019-01-20: 1

## 2019-01-20 MED ORDER — POLYETHYLENE GLYCOL 3350 17 G PO PACK
17.0000 g | PACK | Freq: Every day | ORAL | Status: DC | PRN
  Administered 2019-01-22: 17 g via ORAL
  Filled 2019-01-20: qty 1

## 2019-01-20 MED ORDER — PROPOFOL 10 MG/ML IV BOLUS
INTRAVENOUS | Status: DC | PRN
  Administered 2019-01-20: 130 mg via INTRAVENOUS

## 2019-01-20 MED ORDER — LIDOCAINE HCL (CARDIAC) PF 100 MG/5ML IV SOSY
PREFILLED_SYRINGE | INTRAVENOUS | Status: DC | PRN
  Administered 2019-01-20: 60 mg via INTRAVENOUS

## 2019-01-20 MED ORDER — ONDANSETRON HCL 4 MG/2ML IJ SOLN
INTRAMUSCULAR | Status: AC
  Filled 2019-01-20: qty 2

## 2019-01-20 MED ORDER — METOCLOPRAMIDE HCL 5 MG/ML IJ SOLN: 5.0000 mg | Freq: Three times a day (TID) | INTRAMUSCULAR | Status: DC | PRN

## 2019-01-20 SURGICAL SUPPLY — 64 items
BAG DECANTER FOR FLEXI CONT (MISCELLANEOUS) ×2 IMPLANT
BAG SPEC THK2 15X12 ZIP CLS (MISCELLANEOUS) ×1
BAG ZIPLOCK 12X15 (MISCELLANEOUS) ×2 IMPLANT
BLADE OSCILLATING/SAGITTAL (BLADE) ×2
BLADE SAG 18X100X1.27 (BLADE) ×1 IMPLANT
BLADE SAW SAG 90X13X1.27 (BLADE) ×1 IMPLANT
BLADE SAW SGTL 81X20 HD (BLADE) ×2 IMPLANT
BLADE SURG SZ10 CARB STEEL (BLADE) ×2 IMPLANT
BLADE SW THK.38XMED LNG THN (BLADE) ×1 IMPLANT
BNDG CMPR MED 10X6 ELC LF (GAUZE/BANDAGES/DRESSINGS) ×1
BNDG ELASTIC 6X10 VLCR STRL LF (GAUZE/BANDAGES/DRESSINGS) ×3 IMPLANT
BOWL SMART MIX CTS (DISPOSABLE) ×1 IMPLANT
BRUSH FEMORAL CANAL (MISCELLANEOUS) ×1 IMPLANT
BUR OVAL CARBIDE 4.0 (BURR) IMPLANT
CEMENT HV SMART SET (Cement) ×3 IMPLANT
COVER SURGICAL LIGHT HANDLE (MISCELLANEOUS) ×2 IMPLANT
COVER WAND RF STERILE (DRAPES) IMPLANT
CUFF TOURN SGL QUICK 34 (TOURNIQUET CUFF) ×2
CUFF TRNQT CYL 34X4.125X (TOURNIQUET CUFF) ×1 IMPLANT
DECANTER SPIKE VIAL GLASS SM (MISCELLANEOUS) IMPLANT
DRAPE ORTHO SPLIT 77X108 STRL (DRAPES) ×4
DRAPE SURG ORHT 6 SPLT 77X108 (DRAPES) ×1 IMPLANT
DRAPE U-SHAPE 47X51 STRL (DRAPES) ×2 IMPLANT
DRESSING AQUACEL AG SP 3.5X10 (GAUZE/BANDAGES/DRESSINGS) IMPLANT
DRSG AQUACEL AG ADV 3.5X14 (GAUZE/BANDAGES/DRESSINGS) ×1 IMPLANT
DRSG AQUACEL AG SP 3.5X10 (GAUZE/BANDAGES/DRESSINGS)
DURAPREP 26ML APPLICATOR (WOUND CARE) ×2 IMPLANT
ELECT REM PT RETURN 15FT ADLT (MISCELLANEOUS) ×2 IMPLANT
EVACUATOR 1/8 PVC DRAIN (DRAIN) ×1 IMPLANT
GAUZE SPONGE 4X4 12PLY STRL (GAUZE/BANDAGES/DRESSINGS) ×1 IMPLANT
GLOVE BIO SURGEON STRL SZ7.5 (GLOVE) ×2 IMPLANT
GLOVE BIO SURGEON STRL SZ8.5 (GLOVE) ×2 IMPLANT
GLOVE BIOGEL PI IND STRL 8 (GLOVE) ×1 IMPLANT
GLOVE BIOGEL PI INDICATOR 8 (GLOVE) ×1
GOWN STRL REUS W/TWL XL LVL3 (GOWN DISPOSABLE) ×4 IMPLANT
HANDPIECE INTERPULSE COAX TIP (DISPOSABLE) ×2
HOOD PEEL AWAY FLYTE STAYCOOL (MISCELLANEOUS) ×6 IMPLANT
IMMOBILIZER KNEE 20 (SOFTGOODS) ×2
IMMOBILIZER KNEE 20 THIGH 36 (SOFTGOODS) ×1 IMPLANT
INSERT TIBIAL KNEE SZ5 25MM (Insert) ×1 IMPLANT
JET LAVAGE IRRISEPT WOUND (IRRIGATION / IRRIGATOR) ×2
KIT TURNOVER KIT A (KITS) ×1 IMPLANT
LAVAGE JET IRRISEPT WOUND (IRRIGATION / IRRIGATOR) IMPLANT
NDL HYPO 21X1.5 SAFETY (NEEDLE) ×2 IMPLANT
NEEDLE HYPO 21X1.5 SAFETY (NEEDLE) ×4 IMPLANT
NS IRRIG 1000ML POUR BTL (IV SOLUTION) ×2 IMPLANT
PACK TOTAL KNEE CUSTOM (KITS) ×2 IMPLANT
PROTECTOR NERVE ULNAR (MISCELLANEOUS) ×1 IMPLANT
SET HNDPC FAN SPRY TIP SCT (DISPOSABLE) ×1 IMPLANT
SPONGE LAP 18X18 RF (DISPOSABLE) ×1 IMPLANT
STAPLER VISISTAT 35W (STAPLE) IMPLANT
SUT VIC AB 1 CTX 36 (SUTURE) ×2
SUT VIC AB 1 CTX36XBRD ANBCTR (SUTURE) ×1 IMPLANT
SUT VIC AB 2-0 CT1 27 (SUTURE) ×2
SUT VIC AB 2-0 CT1 TAPERPNT 27 (SUTURE) ×1 IMPLANT
SUT VIC AB 3-0 CT1 27 (SUTURE) ×2
SUT VIC AB 3-0 CT1 TAPERPNT 27 (SUTURE) ×1 IMPLANT
SWAB COLLECTION DEVICE MRSA (MISCELLANEOUS) IMPLANT
SWAB CULTURE ESWAB REG 1ML (MISCELLANEOUS) IMPLANT
SYR CONTROL 10ML LL (SYRINGE) ×4 IMPLANT
TRAY FOLEY MTR SLVR 16FR STAT (SET/KITS/TRAYS/PACK) ×2 IMPLANT
WATER STERILE IRR 1000ML POUR (IV SOLUTION) ×4 IMPLANT
WRAP KNEE MAXI GEL POST OP (GAUZE/BANDAGES/DRESSINGS) ×2 IMPLANT
YANKAUER SUCT BULB TIP 10FT TU (MISCELLANEOUS) ×2 IMPLANT

## 2019-01-20 NOTE — Evaluation (Signed)
Physical Therapy Evaluation Patient Details Name: Eric Santos MRN: [id number] DOB: Dec 03, 1945 Today's Date: 01/20/2019   History of Present Illness  Patient is 73 y.o. male s/p radical irrigation and debridement of infected total knee arthroplasty with PMH significant for DM, OA, HLD, A-fib, and OSA.    Clinical Impression  Eric Santos is a 73 y.o. male POD 0 s/p I&D of infected Rt TKA. Patient reports independence with mobility at baseline. Patient is now limited by functional impairments (see PT problem list below) and requires min assist/guard for transfers and gait with RW. Patient was able to ambulate ~90 feet with RW and min guard with cues for safe hand placement. Patient instructed in exercise to facilitate ROM and circulation. Patient will benefit from continued skilled PT interventions to address impairments and progress towards PLOF. Acute PT will follow to progress mobility and stair training in preparation for safe discharge home.    Follow Up Recommendations Follow surgeon's recommendation for DC plan and follow-up therapies    Equipment Recommendations  None recommended by PT    Recommendations for Other Services       Precautions / Restrictions Precautions Precautions: Fall Restrictions Weight Bearing Restrictions: No RLE Weight Bearing: Weight bearing as tolerated      Mobility  Bed Mobility Overal bed mobility: Needs Assistance Bed Mobility: Supine to Sit     Supine to sit: HOB elevated;Min assist     General bed mobility comments: no cues required for sequencing, pt using be rails and min assist to raise trunk  Transfers Overall transfer level: Needs assistance Equipment used: Rolling walker (2 wheeled) Transfers: Sit to/from Stand Sit to Stand: Min guard;From elevated surface         General transfer comment: cues for safe hand placement and technique with RW, no assist required to initiate power up  Ambulation/Gait Ambulation/Gait  assistance: Min guard Gait Distance (Feet): 90 Feet Assistive device: Rolling walker (2 wheeled) Gait Pattern/deviations: Step-through pattern;Decreased weight shift to right Gait velocity: WNL's   General Gait Details: ces for safe hand placement on RW requried intermittently, pt maintained safe proximity within RW, no overt LOB noted throughout  Stairs            Wheelchair Mobility    Modified Rankin (Stroke Patients Only)       Balance Overall balance assessment: Needs assistance Sitting-balance support: Feet supported;No upper extremity supported Sitting balance-Leahy Scale: Good     Standing balance support: During functional activity;Bilateral upper extremity supported Standing balance-Leahy Scale: Fair                Pertinent Vitals/Pain Pain Assessment: No/denies pain    Home Living Family/patient expects to be discharged to:: Private residence Living Arrangements: Spouse/significant other Available Help at Discharge: Family;Available 24 hours/day Type of Home: House Home Access: Stairs to enter;Ramped entrance Entrance Stairs-Rails: None Entrance Stairs-Number of Steps: 1 step Home Layout: One level Home Equipment: Wheelchair - power;Grab bars - toilet;Shower seat - built in;Walker - 2 wheels;Cane - single point Additional Comments: 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    Prior Function Level of Independence: Independent               Hand Dominance   Dominant Hand: Right    Extremity/Trunk Assessment   Upper Extremity Assessment Upper Extremity Assessment: Overall WFL for tasks assessed    Lower Extremity Assessment Lower Extremity Assessment: Overall WFL for tasks assessed;RLE deficits/detail  RLE Deficits / Details: good quad activation, no extensor lag with SLR, 4/5 for quad strength with MMT RLE Sensation: WNL RLE Coordination: WNL    Cervical / Trunk Assessment Cervical /  Trunk Assessment: Normal  Communication   Communication: No difficulties  Cognition Arousal/Alertness: Awake/alert Behavior During Therapy: WFL for tasks assessed/performed Overall Cognitive Status: Within Functional Limits for tasks assessed               General Comments      Exercises Total Joint Exercises Ankle Circles/Pumps: AROM;15 reps;Seated;Both Quad Sets: AROM;10 reps;Seated;Right Heel Slides: AROM;10 reps;Seated;Right   Assessment/Plan    PT Assessment Patient needs continued PT services  PT Problem List Decreased strength;Decreased balance;Decreased mobility;Decreased range of motion;Decreased activity tolerance;Decreased knowledge of use of DME       PT Treatment Interventions DME instruction;Functional mobility training;Therapeutic activities;Gait training;Stair training;Therapeutic exercise;Balance training;Patient/family education;Modalities    PT Goals (Current goals can be found in the Care Plan section)  Acute Rehab PT Goals Patient Stated Goal: return home and get back to walking independent PT Goal Formulation: With patient Time For Goal Achievement: 01/27/19 Potential to Achieve Goals: Good    Frequency 7X/week    AM-PAC PT "6 Clicks" Mobility  Outcome Measure Help needed turning from your back to your side while in a flat bed without using bedrails?: A Little Help needed moving from lying on your back to sitting on the side of a flat bed without using bedrails?: A Little Help needed moving to and from a bed to a chair (including a wheelchair)?: A Little Help needed standing up from a chair using your arms (e.g., wheelchair or bedside chair)?: A Little Help needed to walk in hospital room?: A Little Help needed climbing 3-5 steps with a railing? : A Little 6 Click Score: 18    End of Session Equipment Utilized During Treatment: Gait belt Activity Tolerance: Patient tolerated treatment well Patient left: with call bell/phone within reach;in  chair;with family/visitor present;with chair alarm set Nurse Communication: Mobility status PT Visit Diagnosis: Muscle weakness (generalized) (M62.81);Difficulty in walking, not elsewhere classified (R26.2)    Time: [id number] PT Time Calculation (min) (ACUTE ONLY): 22 min   Charges:   PT Evaluation $PT Eval Low Complexity: 1 Low          Kipp Brood, PT, DPT Physical Therapist with [facility]  01/20/2019 5:05 PM

## 2019-01-20 NOTE — Plan of Care (Signed)
Hospitalist Plan of [facility] initially called for admission for Eric Santos for concern for septic right knee with hardware after joint aspiration in the ED. After discussion with Orthopedic Surgery, the decision was made to admit to Orthopedic Surgery service rather than Hospital Medicine. Orders placed by [facility] including admission order have been subsequently discontinued.  Bennie Pierini, MD 01/20/19 12:26 AM

## 2019-01-20 NOTE — Op Note (Addendum)
DATE OF PROCEDURE: 01/20/2019 PREOPERATIVE DIAGNOSIS: Recurrent infection revision right total knee arthroplasty Estimated body mass index is 35.23 kg/(m^2) as calculated from the following:  Height as of this encounter: 6' 2.5" (1.892 m).  Weight as of this encounter: 126.1 kg (278 lb).  POSTOPERATIVE DIAGNOSIS: Same PROCEDURE: Radical irrigation and debridement of infected total knee arthroplasty with revision of DePuy tibial component TC 3 RP number 525 mm bearing 6 6  SURGEON: Hallis Meditz J  ASSISTANT: Eric K. Barton Dubois (present throughout entire procedure and necessary for timely completion of the procedure)  ANESTHESIA: General LMA  Continue to DRAINS: foley, 2 medium hemovac in knee joint,  TOURNIQUET TIME: 15 min  COMPLICATIONS: None SPECIMENS: Infected tissue for Gram stain and culture.   INDICATIONS FOR PROCEDURE: 73 year old man who had a primary right total knee arthroplasty in 1998 developed a spontaneous MRSA infection in 2008, underwent two-stage revision and did well until a few days ago when he noticed swelling and pain again in his right revision total knee developed a fever of 102.12 days ago presented to the [facility] emergency room yesterday evening with an effusion the knee was tapped 100 cc of bloody fluid came back with a cell count of 16,000 and he was febrile.  He was admitted placed on vancomycin radiographs showed some erosions around the long stemmed implants but no evidence of loosening.  He has a presumed recurrent infection around her revision right total knee we discussed amputation versus washout with polyexchange.  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  Risks and benefits of surgery been discussed at length he does understand that this may fail early and he may still need an amputation and he accepts this risk.  DESCRIPTION OF  PROCEDURE: The patient identified by armband, and taken to the operating room, appropriate anesthetic monitors were attached General endotracheal anesthesia induced with  the patient in supine position, Foley catheter was inserted. Tourniquet  applied high to the operative thigh. Lateral post and foot positioner  applied to the table, the lower extremity was then prepped and draped  in usual sterile fashion from the ankle to the tourniquet. Time-out procedure was performed. T We began the operation, with the knee flexed 120 degrees, by making the anterior midline incision starting at handbreadth above the patella going over the patella 1 cm medial to and  6 cm distal to the tibial tubercle, reproducing the old incision. Immediately encountered large effusion that was relatively clear.  We elevated the medial collateral ligament off the proximal tibia it was quite thickened and weary thinned it out about 50%.  Some of the synovium was sent off to the lab for Gram stain and culture.  We also remove synovium from medial lateral of the femoral component where it eroded into the bone and using curettes got down to healthy bone.  There is also a defect on the proximal medial tibia where the bone eroded and the fibrous tissue was removed from there as well these areas would be amenable to packing with antibiotic loaded cement.  We continue to work our way around posteriorly on the tibia with external rotation we were able to dislocate the TC 3 bearing and remove it without difficulty.  We performed a posterior synovectomy and it should be noted the posterior synovium was not particularly inflamed.  At this point having removed all  the inflamed synovium we went ahead and inflated the tourniquet and then thoroughly irrigated with pulse lavage hydrogen peroxide and irrisept.  A single batch of DePuy HV cement was then mixed with 750 mg of vancomycin and 1 g of tobramycin and the cement was packed into the defect and the  bone around the femoral component and tibial component so that it would be able to lose some antibiotics.  After the cemented hardened we performed trial reductions with a 22.5 bearing which was a size we removed as well as a 25 mm bearing and found the best ligamentous stability with a 25 mm bearing a new 25 mm TC 3 RP bearing was then inserted the knee was reduced the patella was reduced and we took the knee through range of motion.  Tourniquet was let down minimal bleeding was noted at this time parapatellar arthrotomy was closed with running #1 Vicryl suture subcutaneous tissue with 3-0 Vicryl suture and a subcuticular closure of 3-0 Vicryl suture was accomplished followed by dressing of Aquacel.  Prior to closure we did place 2 small Hemovac drains and tested with normal saline to make sure that they were working and the drains were placed on suction. The patient was then awakened, extubated, and taken to recovery room without difficulty.  Jasdeep Kepner J  01/20/2019, 1:00 PM

## 2019-01-20 NOTE — Anesthesia Preprocedure Evaluation (Addendum)
Anesthesia Evaluation  Patient identified by MRN, date of birth, ID band Patient awake    Reviewed: Allergy & Precautions, NPO status , Patient's Chart, lab work & pertinent test results  History of Anesthesia Complications Negative for: history of anesthetic complications  Airway Mallampati: II  TM Distance: >3 FB Neck ROM: Full    Dental  (+) Dental Advisory Given, Teeth Intact   Pulmonary sleep apnea and Continuous Positive Airway Pressure Ventilation ,    Pulmonary exam normal        Cardiovascular Normal cardiovascular exam+ dysrhythmias Atrial Fibrillation      Neuro/Psych negative neurological ROS  negative psych ROS   GI/Hepatic Neg liver ROS, hiatal hernia, GERD  Controlled and Medicated,  Endo/Other  diabetes, Type 2Morbid obesity  Renal/GU negative Renal ROS     Musculoskeletal  (+) Arthritis , Osteoarthritis,    Abdominal (+) + obese,   Peds  Hematology  Eliquis last dose 11/17   Anesthesia Other Findings   Reproductive/Obstetrics                            Anesthesia Physical Anesthesia Plan  ASA: III  Anesthesia Plan: General   Post-op Pain Management:  Regional for Post-op pain   Induction: Intravenous  PONV Risk Score and Plan: 3 and Treatment may vary due to age or medical condition, Ondansetron and Propofol infusion  Airway Management Planned: LMA  Additional Equipment: None  Intra-op Plan:   Post-operative Plan: Extubation in OR  Informed Consent: I have reviewed the patients History and Physical, chart, labs and discussed the procedure including the risks, benefits and alternatives for the proposed anesthesia with the patient or authorized representative who has indicated his/her understanding and acceptance.     Dental advisory given  Plan Discussed with: CRNA and Anesthesiologist  Anesthesia Plan Comments:        Anesthesia Quick  Evaluation

## 2019-01-20 NOTE — Anesthesia Procedure Notes (Signed)
Anesthesia Regional Block: Adductor canal block   Pre-Anesthetic Checklist: ,, timeout performed, Correct Patient, Correct Site, Correct Laterality, Correct Procedure, Correct Position, site marked, Risks and benefits discussed,  Surgical consent,  Pre-op evaluation,  At surgeon's request and post-op pain management  Laterality: Right  Prep: chloraprep       Needles:  Injection technique: Single-shot  Needle Type: Echogenic Needle     Needle Length: 10cm  Needle Gauge: 21     Additional Needles:   Narrative:  Start time: 01/20/2019 10:19 AM End time: 01/20/2019 10:23 AM Injection made incrementally with aspirations every 5 mL.  Performed by: Personally  Anesthesiologist: Audry Pili, MD  Additional Notes: No pain on injection. No increased resistance to injection. Injection made in 5cc increments. Good needle visualization. Patient tolerated the procedure well.

## 2019-01-20 NOTE — Anesthesia Postprocedure Evaluation (Signed)
Anesthesia Post Note  Patient: Eric Santos  Procedure(s) Performed: IRRIGATION AND DEBRIDEMENT KNEE WITH POLY EXCHANGE RIGHT KNEE (Right Knee)     Patient location during evaluation: PACU Anesthesia Type: General Level of consciousness: awake and alert Pain management: pain level controlled Vital Signs Assessment: post-procedure vital signs reviewed and stable Respiratory status: spontaneous breathing, nonlabored ventilation and respiratory function stable Cardiovascular status: blood pressure returned to baseline and stable Postop Assessment: no apparent nausea or vomiting Anesthetic complications: no    Last Vitals:  Vitals:   01/20/19 1400 01/20/19 1442  BP: 133/85 130/81  Pulse: 75 84  Resp: 19 14  Temp: 36.8 C 36.7 C  SpO2: 97% 97%    Last Pain:  Vitals:   01/20/19 1442  TempSrc: Oral  PainSc: 0-No pain                 Audry Pili

## 2019-01-20 NOTE — Discharge Instructions (Signed)

## 2019-01-20 NOTE — Transfer of Care (Signed)
Immediate Anesthesia Transfer of Care Note  Patient: Eric Santos  Procedure(s) Performed: IRRIGATION AND DEBRIDEMENT KNEE WITH POLY EXCHANGE RIGHT KNEE (Right Knee)  Patient Location: PACU  Anesthesia Type:General  Level of Consciousness: drowsy, patient cooperative and responds to stimulation  Airway & Oxygen Therapy: Patient Spontanous Breathing and Patient connected to face mask oxygen  Post-op Assessment: Report given to RN and Post -op Vital signs reviewed and stable  Post vital signs: Reviewed and stable  Last Vitals:  Vitals Value Taken Time  BP 140/83 01/20/19 1330  Temp    Pulse 81 01/20/19 1330  Resp 22 01/20/19 1330  SpO2 100 % 01/20/19 1330  Vitals shown include unvalidated device data.  Last Pain:  Vitals:   01/20/19 1021  TempSrc:   PainSc: 0-No pain      Patients Stated Pain Goal: 2 (0000000 [id number])  Complications: No apparent anesthesia complications

## 2019-01-20 NOTE — Anesthesia Procedure Notes (Signed)
Procedure Name: LMA Insertion Date/Time: 01/20/2019 10:48 AM Performed by: Glory Buff, CRNA Pre-anesthesia Checklist: Patient identified, Emergency Drugs available, Suction available and Patient being monitored Patient Re-evaluated:Patient Re-evaluated prior to induction Oxygen Delivery Method: Circle system utilized Preoxygenation: Pre-oxygenation with 100% oxygen Induction Type: IV induction LMA: LMA inserted LMA Size: 5.0 Number of attempts: 2 Placement Confirmation: positive ETCO2 Tube secured with: Tape Dental Injury: Teeth and Oropharynx as per pre-operative assessment

## 2019-01-20 NOTE — Progress Notes (Signed)
Pt was provided with informed surgical and blood consent. Pt denied any questions. Both consent forms are signed and located in the pt's chart.

## 2019-01-20 NOTE — Progress Notes (Signed)
Pharmacy Antibiotic Note  Eric Santos is a 73 y.o. male admitted on 01/19/2019 with septic right knee.  Pharmacy has been consulted for Vancomycin dosing.  Plan: vancomycin 2gm iv x1, then Vancomycin 1750 mg IV Q 24 hrs. Goal AUC 400-550. Expected AUC: 473 SCr used: 0.85   Height: 5\' 6"  (167.6 cm) Weight: 258 lb 6.1 oz (117.2 kg) IBW/kg (Calculated) : 63.8  Temp (24hrs), Avg:98.8 F (37.1 C), Min:98 F (36.7 C), Max:99.2 F (37.3 C)  Recent Labs  Lab 01/19/19 1833  WBC 14.3*  CREATININE 0.85  LATICACIDVEN 1.6    Estimated Creatinine Clearance: 94.7 mL/min (by C-G formula based on SCr of 0.85 mg/dL).    Allergies  Allergen Reactions  . Adhesive [Tape] Other (See Comments)    "Peels off skin"    Antimicrobials this admission: Vancomycin 01/19/2019 >>  Dose adjustments this admission: -  Microbiology results: -  Thank you for allowing pharmacy to be a part of this patient's care.  Nani Skillern Crowford 01/20/2019 4:46 AM

## 2019-01-20 NOTE — Interval H&P Note (Signed)
History and Physical Interval Note:  01/20/2019 10:25 AM  Eric Santos  has presented today for surgery, with the diagnosis of infected right knee.  The various methods of treatment have been discussed with the patient and family. After consideration of risks, benefits and other options for treatment, the patient has consented to  Procedure(s): IRRIGATION AND DEBRIDEMENT KNEE WITH POLY EXCHANGE RIGHT KNEE (Right) as a surgical intervention.  The patient's history has been reviewed, patient examined, no change in status, stable for surgery.  I have reviewed the patient's chart and labs.  Questions were answered to the patient's satisfaction.     Kerin Salen

## 2019-01-20 NOTE — Progress Notes (Signed)
Assisted Dr. Brock with right, ultrasound guided, adductor canal block. Side rails up, monitors on throughout procedure. See vital signs in flow sheet. Tolerated Procedure well.  

## 2019-01-21 ENCOUNTER — Encounter (HOSPITAL_COMMUNITY): Payer: Self-pay | Admitting: Orthopedic Surgery

## 2019-01-21 ENCOUNTER — Inpatient Hospital Stay: Payer: Self-pay

## 2019-01-21 DIAGNOSIS — I4891 Unspecified atrial fibrillation: Secondary | ICD-10-CM

## 2019-01-21 DIAGNOSIS — L814 Other melanin hyperpigmentation: Secondary | ICD-10-CM

## 2019-01-21 DIAGNOSIS — Z7901 Long term (current) use of anticoagulants: Secondary | ICD-10-CM

## 2019-01-21 DIAGNOSIS — Z91048 Other nonmedicinal substance allergy status: Secondary | ICD-10-CM

## 2019-01-21 DIAGNOSIS — Z96651 Presence of right artificial knee joint: Secondary | ICD-10-CM

## 2019-01-21 DIAGNOSIS — B9562 Methicillin resistant Staphylococcus aureus infection as the cause of diseases classified elsewhere: Secondary | ICD-10-CM

## 2019-01-21 DIAGNOSIS — T8453XA Infection and inflammatory reaction due to internal right knee prosthesis, initial encounter: Principal | ICD-10-CM

## 2019-01-21 DIAGNOSIS — M00061 Staphylococcal arthritis, right knee: Secondary | ICD-10-CM

## 2019-01-21 LAB — PROTEIN, BODY FLUID (OTHER): Total Protein, Body Fluid Other: 3.5 g/dL

## 2019-01-21 LAB — URIC ACID, BODY FLUID: Uric Acid Body Fluid: 5 mg/dL

## 2019-01-21 LAB — GLUCOSE, BODY FLUID OTHER: Glucose, Body Fluid Other: 8 mg/dL

## 2019-01-21 LAB — BASIC METABOLIC PANEL
Anion gap: 7 (ref 5–15)
BUN: 10 mg/dL (ref 8–23)
CO2: 22 mmol/L (ref 22–32)
Calcium: 7.8 mg/dL — ABNORMAL LOW (ref 8.9–10.3)
Chloride: 105 mmol/L (ref 98–111)
Creatinine, Ser: 0.7 mg/dL (ref 0.61–1.24)
GFR calc Af Amer: >60 mL/min (ref >60)
GFR calc non Af Amer: >60 mL/min (ref >60)
Glucose, Bld: 141 mg/dL — ABNORMAL HIGH (ref 70–99)
Potassium: 4.1 mmol/L (ref 3.5–5.1)
Sodium: 134 mmol/L — ABNORMAL LOW (ref 135–145)

## 2019-01-21 LAB — CBC
HCT: 38.3 % — ABNORMAL LOW (ref 39.0–52.0)
Hemoglobin: 12.5 g/dL — ABNORMAL LOW (ref 13.0–17.0)
MCH: 31.2 pg (ref 26.0–34.0)
MCHC: 32.6 g/dL (ref 30.0–36.0)
MCV: 95.5 fL (ref 80.0–100.0)
Platelets: 152 10*3/uL (ref 150–400)
RBC: 4.01 MIL/uL — ABNORMAL LOW (ref 4.22–5.81)
RDW: 12.8 % (ref 11.5–15.5)
WBC: 11.8 10*3/uL — ABNORMAL HIGH (ref 4.0–10.5)
nRBC: 0 % (ref 0.0–0.2)

## 2019-01-21 MED ORDER — SODIUM CHLORIDE 0.9% FLUSH: 10.0000 mL | INTRAVENOUS | Status: DC | PRN

## 2019-01-21 NOTE — Progress Notes (Signed)
Peripherally Inserted Central Catheter/Midline Placement  The IV Nurse has discussed with the patient and/or persons authorized to consent for the patient, the purpose of this procedure and the potential benefits and risks involved with this procedure.  The benefits include less needle sticks, lab draws from the catheter, and the patient may be discharged home with the catheter. Risks include, but not limited to, infection, bleeding, blood clot (thrombus formation), and puncture of an artery; nerve damage and irregular heartbeat and possibility to perform a PICC exchange if needed/ordered by physician.  Alternatives to this procedure were also discussed.  Bard Power PICC patient education guide, fact sheet on infection prevention and patient information card has been provided to patient /or left at bedside.    PICC/Midline Placement Documentation  PICC Single Lumen 01/21/19 PICC Right Brachial 44 cm 0 cm (Active)  Indication for Insertion or Continuance of Line Home intravenous therapies (PICC only) 01/21/19 1641  Exposed Catheter (cm) 0 cm 01/21/19 1641  Site Assessment Clean;Dry;Intact 01/21/19 1641  Line Status Flushed;Blood return noted 01/21/19 1641  Dressing Type Transparent 01/21/19 1641  Dressing Status Clean;Dry;Intact;Antimicrobial disc in place;Other (Comment) 01/21/19 1641  Dressing Intervention New dressing 01/21/19 [id number]  Dressing Change Due 01/28/19 01/21/19 1641       Christella Noa Albarece 01/21/2019, 4:42 PM

## 2019-01-21 NOTE — Plan of Care (Signed)
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
## 2019-01-21 NOTE — Progress Notes (Signed)
PATIENT ID: RYLIN EAGAN  MRN: [id number]  DOB/AGE:  72-Feb-1947 / 72 y.o.  1 Day Post-Op Procedure(s) (LRB): IRRIGATION AND DEBRIDEMENT KNEE WITH POLY EXCHANGE RIGHT KNEE (Right)    PROGRESS NOTE Subjective: Patient is alert, oriented, no Nausea, no Vomiting, yes passing gas. Taking PO well. Denies SOB, Chest or Calf Pain. Using Incentive Spirometer, PAS in place. Ambulate 90', Patient reports pain as 2/10 .    Objective: Vital signs in last 24 hours: Vitals:   01/20/19 1817 01/20/19 2150 01/21/19 0200 01/21/19 0514  BP: 118/81 120/74 102/74 118/77  Pulse: 86 85 75 78  Resp: 16 16 16 16   Temp: 98.3 F (36.8 C) 99.4 F (37.4 C) 98.3 F (36.8 C) 98.3 F (36.8 C)  TempSrc: Oral Oral Oral Oral  SpO2: 100% 98% 96% 99%  Weight:      Height:          Intake/Output from previous day: I/O last 3 completed shifts: In: 4065.8 [P.O.:610; I.V.:2745.8; IV Piggyback:710] Out: [id number] [Urine:3000; Drains:220; Blood:100]   Body fluid culture shows gram-positive cocci, sensitivities pending   Intake/Output this shift: No intake/output data recorded.   LABORATORY DATA: Recent Labs    01/19/19 1833 01/20/19 0451 01/20/19 1332 01/21/19 0329  WBC 14.3* 12.1*  --  11.8*  HGB 16.1 14.3  --  12.5*  HCT 49.5 44.0  --  38.3*  PLT 183 165  --  152  NA 138  --   --  134*  K 4.3  --   --  4.1  CL 102  --   --  105  CO2 26  --   --  22  BUN 12  --   --  10  CREATININE 0.85  --   --  0.70  GLUCOSE 126*  --   --  141*  GLUCAP  --   --  135*  --   CALCIUM 9.1  --   --  7.8*    Examination: Neurologically intact ABD soft Neurovascular intact Sensation intact distally Intact pulses distally Dorsiflexion/Plantar flexion intact Incision: dressing C/D/I No cellulitis present Compartment soft}  Assessment:   1 Day Post-Op Procedure(s) (LRB): IRRIGATION AND DEBRIDEMENT KNEE WITH POLY EXCHANGE RIGHT KNEE (Right).   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  Goal of the irrigation and debridement was to set the patient up for suppression of the recurrent infection.  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  ADDITIONAL DIAGNOSIS: Expected Acute Blood Loss Anemia, SVT, morbid obesity, OSA, GERD, hx aflutter    Anticipated LOS equal to or greater than 2 midnights due to - Age 72 and older with one or more of the following:  - Obesity  - Expected need for hospital services (PT, OT, Nursing) required for safe  discharge  - Anticipated need for postoperative skilled nursing care or inpatient rehab  Plan:  Willie drains in place until the drainage drops below 50 cc a shift and the fluid clears.  Patient will he be observed for 1 more day and then discharged home on antibiotics, anticipate 6 weeks IV therapy and then indefinite oral therapy.  PT/OT WBAT, AROM, Do not push range of motion which may stir of bleeding  ABX per infectious disease, will probably need PICC line. DVT Prophylaxis:  SCDx72hrs, ASA 81 mg BID x 2 weeks DISCHARGE PLAN: Home DISCHARGE NEEDS: HHPT, Walker, 3-in-1 comode seat and IV  Antibiotics     Kerin Salen 01/21/2019, 7:54 AM Patient ID: Nelva Nay, male   DOB: 1945/09/30, 73 y.o.   MRN: [id number]

## 2019-01-21 NOTE — Progress Notes (Signed)
Physical Therapy Treatment Patient Details Name: Eric Santos MRN: [id number] DOB: December 15, 1945 Today's Date: 01/21/2019    History of Present Illness Patient is 73 y.o. male s/p radical irrigation and debridement of infected total knee arthroplasty with PMH significant for DM, OA, HLD, A-fib, and OSA.    PT Comments    Pt eager to get OOB on arrival.  Pt assisted with ambulating in hallway and reports knee feeling unstable.  Pt left up in recliner end of session.    Follow Up Recommendations  Follow surgeon's recommendation for DC plan and follow-up therapies     Equipment Recommendations  None recommended by PT    Recommendations for Other Services       Precautions / Restrictions Precautions Precautions: Fall;Knee Precaution Comments: "do not push ROM which may stir bleeding" from 11/19 note Restrictions RLE Weight Bearing: Weight bearing as tolerated    Mobility  Bed Mobility Overal bed mobility: Needs Assistance Bed Mobility: Supine to Sit     Supine to sit: Min guard;HOB elevated        Transfers Overall transfer level: Needs assistance Equipment used: Rolling walker (2 wheeled) Transfers: Sit to/from Stand Sit to Stand: Min guard         General transfer comment: cues for safe hand placement and technique with RW, no assist required to initiate power up  Ambulation/Gait Ambulation/Gait assistance: Min guard Gait Distance (Feet): 120 Feet Assistive device: Rolling walker (2 wheeled) Gait Pattern/deviations: Step-through pattern;Decreased weight shift to right;Antalgic     General Gait Details: verbal cues for use of RW, step length, posture; required one standing rest break   Stairs             Wheelchair Mobility    Modified Rankin (Stroke Patients Only)       Balance                                            Cognition Arousal/Alertness: Awake/alert Behavior During Therapy: WFL for tasks  assessed/performed Overall Cognitive Status: Within Functional Limits for tasks assessed                                        Exercises Total Joint Exercises Ankle Circles/Pumps: AROM;Both;10 reps Quad Sets: AROM;Both;10 reps    General Comments        Pertinent Vitals/Pain Pain Assessment: 0-10 Pain Score: 4  Pain Location: right knee Pain Descriptors / Indicators: Aching;Sore Pain Intervention(s): Repositioned;Monitored during session;Ice applied    Home Living                      Prior Function            PT Goals (current goals can now be found in the care plan section) Progress towards PT goals: Progressing toward goals    Frequency    7X/week      PT Plan Current plan remains appropriate    Co-evaluation              AM-PAC PT "6 Clicks" Mobility   Outcome Measure  Help needed turning from your back to your side while in a flat bed without using bedrails?: A Little Help needed moving from lying on your back to sitting on the side of a  flat bed without using bedrails?: A Little Help needed moving to and from a bed to a chair (including a wheelchair)?: A Little Help needed standing up from a chair using your arms (e.g., wheelchair or bedside chair)?: A Little Help needed to walk in hospital room?: A Little Help needed climbing 3-5 steps with a railing? : A Little 6 Click Score: 18    End of Session Equipment Utilized During Treatment: Gait belt Activity Tolerance: Patient tolerated treatment well Patient left: with call bell/phone within reach;in chair;with family/visitor present;with chair alarm set   PT Visit Diagnosis: Muscle weakness (generalized) (M62.81);Difficulty in walking, not elsewhere classified (R26.2)     Time: [id number] PT Time Calculation (min) (ACUTE ONLY): 15 min  Charges:  $Gait Training: 8-22 mins                    Carmelia Bake, PT, DPT Acute Rehabilitation Services Office:  [phone] Pager: [phone]   Trena Platt 01/21/2019, 1:04 PM

## 2019-01-21 NOTE — Progress Notes (Addendum)
Joanell Rising, PA was paged regarding the pt's pulse of 166. Pt's prior trending pulse 70-80's. Pt has hx of a fib. Pt given scheduled metoprolol. Will CTM.  Apical pulse 66. Will CTM.

## 2019-01-21 NOTE — TOC Initial Note (Signed)
Transition of Care [facility]) - Initial/Assessment Note    Patient Details  Name: Eric Santos MRN: [id number] Date of Birth: 22-Jan-1946  [facility] of [facility]) CM/SW Contact:    Lia Hopping, [place] Phone Number: 01/21/2019, 4:43 PM  Clinical Narrative:                 CSW met with the patient and his spouse at bedside to discuss discharge plan home with IV antibiotics. Patient reports that he had IV antibiotics at home before and is somewhat familiar with the process. Spouse reports will assist him at home with his care. CSW provided the patient with Medicare.gov list of Home Health agencies for PT/ RN in area code. CSW reached out to several [place] listed.  [facility] is agreeable to accept, Rep. Cassie confirm they will provide PT/ RN services.  Carolynn Sayers with [facility] will provide IV care teachings and continue to coordinate home care.  Patient has DME.  Expected Discharge Plan: [place] Barriers to Discharge: Continued Medical Work up   Patient Goals and CMS Choice   CMS Medicare.gov Compare Post Acute Care list provided to:: Patient Choice offered to / list presented to : Patient  Expected Discharge Plan and Services Expected Discharge Plan: [place] In-house Referral: Clinical Social Work Discharge Planning Services: CM Consult Post Acute Care Choice: [place] arrangements for the past 2 months: Single Family Home                     Date DME Agency Contacted: 01/21/19 Time DME Agency Contacted: 70 Representative spoke with at DME Agency: Cassie Flippin: PT, IV Antibiotics [place] Agency: [facility] [place] Date [place]: 01/21/19 Time HH Agency Contacted: [phone] Representative spoke with at [place] Arrangements/Services Living arrangements for the past 2 months: [place] with:: Self Patient language and need for interpreter  reviewed:: No Do you feel safe going back to the place where you live?: Yes      Need for Family Participation in Patient Care: Yes (Comment) Care giver support system in place?: Yes (comment) Current home services: Home PT Criminal Activity/Legal Involvement Pertinent to Current Situation/Hospitalization: No - Comment as needed  Activities of Daily Living Home Assistive Devices/Equipment: None ADL Screening (condition at time of admission) Patient's cognitive ability adequate to safely complete daily activities?: Yes Is the patient deaf or have difficulty hearing?: No Does the patient have difficulty seeing, even when wearing glasses/contacts?: No Does the patient have difficulty concentrating, remembering, or making decisions?: No Patient able to express need for assistance with ADLs?: No Does the patient have difficulty dressing or bathing?: No Independently performs ADLs?: Yes (appropriate for developmental age) Does the patient have difficulty walking or climbing stairs?: No Weakness of Legs: Right Weakness of Arms/Hands: None  Permission Sought/Granted Permission sought to share information with : Family Supports, Chartered certified accountant granted to share information with : Yes, Verbal Permission Granted        Permission granted to share info w Relationship: Spouse     Emotional Assessment Appearance:: Appears stated age Attitude/Demeanor/Rapport: Engaged Affect (typically observed): Accepting, Pleasant Orientation: : Oriented to Self, Oriented to Place, Oriented to  Time, Oriented to Situation Alcohol / Substance Use: Not Applicable Psych Involvement: No (comment)  Admission diagnosis:  Pyogenic arthritis of right knee joint, due to unspecified organism [facility]) [M00.9] Septic arthritis of  knee, right [facility]) [M00.9] Patient Active Problem List   Diagnosis Date Noted  . 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 Infection of prosthetic  right knee joint ([place]) 01/19/2019  . Septic arthritis of knee, right ([place]) 01/19/2019  . Left knee pain 01/22/2018  . Left flank pain 01/14/2017  . Right foot pain 07/11/2016  . Varicose veins of leg with swelling, right 07/11/2016  . SVT (supraventricular tachycardia) ([place]) 09/01/2015  . Low back pain 06/09/2015  . Obesity, Class III, BMI 40-49.9 (morbid obesity) ([place]) 05/01/2015  . Atrial flutter ([place]) 04/13/2015  . Palpitations 03/08/2015  . Left groin pain 01/06/2014  . 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 Heart palpitations 10/26/2012  . Carpal tunnel syndrome of right wrist 10/26/2012  . 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 Basal cell carcinoma of brow 10/15/2011  . Preop exam for internal medicine 04/16/2011  . External hemorrhoid 10/10/2010  . Bladder neck obstruction 10/10/2010  . Shingles 08/29/2010  . Preventative health care 08/29/2010  . Other specified forms of hearing loss 10/03/2009  . 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 Fatigue 10/03/2009  . Impaired glucose tolerance 01/18/2008  . Hyperlipidemia 01/18/2008  . Obstructive sleep apnea 01/18/2008  . Allergic rhinitis 01/18/2008  . GERD 01/18/2008  . DIVERTICULOSIS, COLON 01/18/2008  . NEPHROLITHIASIS, HX OF 01/18/2008   PCP:  Biagio Borg, MD Pharmacy:   [facility] [phone] [place], [place] AT [place] [address] [place] [address] Phone: [phone] Fax: [phone]  [place], [place] [facility] [address] [place] [address] [place] [address] Phone: [phone] Fax: [phone]  [facility] [employer] [facility] [employer], [place] - [address] [place] [address] Phone: [phone] Fax: [phone]     Social Determinants of Health (SDOH) Interventions    Readmission Risk Interventions No flowsheet data found.

## 2019-01-22 DIAGNOSIS — B957 Other staphylococcus as the cause of diseases classified elsewhere: Secondary | ICD-10-CM

## 2019-01-22 DIAGNOSIS — Z8614 Personal history of Methicillin resistant Staphylococcus aureus infection: Secondary | ICD-10-CM

## 2019-01-22 LAB — CULTURE, BODY FLUID W GRAM STAIN -BOTTLE

## 2019-01-22 LAB — CK: Total CK: 89 U/L (ref 49–397)

## 2019-01-22 LAB — HIV ANTIBODY (ROUTINE TESTING W REFLEX)
HIV Screen 4th Generation wRfx: NONREACTIVE
HIV Screen 4th Generation wRfx: NONREACTIVE

## 2019-01-22 LAB — CBC
HCT: 37.3 % — ABNORMAL LOW (ref 39.0–52.0)
Hemoglobin: 12.2 g/dL — ABNORMAL LOW (ref 13.0–17.0)
MCH: 31.5 pg (ref 26.0–34.0)
MCHC: 32.7 g/dL (ref 30.0–36.0)
MCV: 96.4 fL (ref 80.0–100.0)
Platelets: 170 10*3/uL (ref 150–400)
RBC: 3.87 MIL/uL — ABNORMAL LOW (ref 4.22–5.81)
RDW: 13 % (ref 11.5–15.5)
WBC: 8.8 10*3/uL (ref 4.0–10.5)
nRBC: 0 % (ref 0.0–0.2)

## 2019-01-22 LAB — HEPATITIS B SURFACE ANTIGEN: Hepatitis B Surface Ag: NONREACTIVE

## 2019-01-22 LAB — SEDIMENTATION RATE: Sed Rate: 46 mm/hr — ABNORMAL HIGH (ref 0–16)

## 2019-01-22 LAB — C-REACTIVE PROTEIN: CRP: <0.8 mg/dL (ref <1.0)

## 2019-01-22 MED ORDER — DAPTOMYCIN IV (FOR PTA / DISCHARGE USE ONLY): 700.0000 mg | INTRAVENOUS | 0 refills | Status: DC

## 2019-01-22 MED ORDER — SODIUM CHLORIDE 0.9 % IV SOLN
700.0000 mg | Freq: Every day | INTRAVENOUS | Status: DC
  Administered 2019-01-22: 700 mg via INTRAVENOUS
  Filled 2019-01-22: qty 14

## 2019-01-22 NOTE — Progress Notes (Signed)
PATIENT ID: Eric Santos  MRN: [id number]  DOB/AGE:  [birth year] / 73 y.o.  2 Days Post-Op Procedure(s) (LRB): IRRIGATION AND DEBRIDEMENT KNEE WITH POLY EXCHANGE RIGHT KNEE (Right)    PROGRESS NOTE Subjective: Patient is alert, oriented, no Nausea, no Vomiting, yes passing gas. Taking PO well. Denies SOB, Chest or Calf Pain. Using Incentive Spirometer, PAS in place. Ambulate WBAT with pt walking with therapy, Patient reports pain as mild .  Drain was just changed with 35 ml in drain.  Objective: Vital signs in last 24 hours: Vitals:   01/22/19 0555 01/22/19 0940 01/22/19 0946 01/22/19 1359  BP: 120/83 122/78 122/78 119/77  Pulse: 84  88 76  Resp: 18  16 16   Temp: 98.2 F (36.8 C)  98 F (36.7 C) 97.9 F (36.6 C)  TempSrc: Oral  Oral Oral  SpO2: 98%  96% 98%  Weight:      Height:          Intake/Output from previous day: I/O last 3 completed shifts: In: 4923.3 [P.O.:1540; I.V.:2383.3; IV Piggyback:1000] Out: 2790 [Urine:2580; Drains:210]   Intake/Output this shift: Total I/O In: 1171.2 [P.O.:480; I.V.:600; IV Piggyback:91.2] Out: 230 [Urine:200; Drains:30]   LABORATORY DATA: Recent Labs    01/19/19 1833  01/20/19 1332 01/21/19 0329 01/22/19 0331  WBC 14.3*   < >  --  11.8* 8.8  HGB 16.1   < >  --  12.5* 12.2*  HCT 49.5   < >  --  38.3* 37.3*  PLT 183   < >  --  152 170  NA 138  --   --  134*  --   K 4.3  --   --  4.1  --   CL 102  --   --  105  --   CO2 26  --   --  22  --   BUN 12  --   --  10  --   CREATININE 0.85  --   --  0.70  --   GLUCOSE 126*  --   --  141*  --   GLUCAP  --   --  135*  --   --   CALCIUM 9.1  --   --  7.8*  --    < > = values in this interval not displayed.    Examination: Neurologically intact Neurovascular intact Sensation intact distally Intact pulses distally Dorsiflexion/Plantar flexion intact Incision: dressing C/D/I and scant drainage No cellulitis present Compartment soft pt's drain was pulled with mild drainage.  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  Goal of the irrigation and debridement was to set the patient up for suppression of the recurrent infection.  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  ADDITIONAL DIAGNOSIS: Expected Acute Blood Loss Anemia, SVT, morbid obesity, OSA, GERD, hx aflutter Anticipated LOS equal to or greater than 2 midnights due to - Age 72 and older with one or more of the following:  - Obesity  - Expected need for hospital services (PT, OT, Nursing) required for safe  discharge  - Anticipated need for postoperative skilled nursing care or inpatient rehab  - Active co-morbidities: infection total knee   Plan: discharged home on antibiotics, anticipate 6 weeks IV therapy  and then indefinite oral therapy.  PT/OT WBAT, AROM, Do not push range of motion which may stir of bleeding  ABX per infectious disease, PICC line in place. DVT Prophylaxis:  SCDx72hrs, ELIQUIS 2.5 mg BID DISCHARGE PLAN: Home DISCHARGE NEEDS: HHPT, Walker, 3-in-1 comode seat and IV Antibiotics     Joanell Rising 01/22/2019, 3:58 PM

## 2019-01-22 NOTE — Progress Notes (Addendum)
PHARMACY CONSULT NOTE FOR:  OUTPATIENT  PARENTERAL ANTIBIOTIC THERAPY (OPAT)  Indication: Recurrent knee PJI Regimen: daptomycin 700mg  IV q24h End date: 03/10/2019  (ID hopes to complete 7 weeks tx)  IV antibiotic discharge orders are pended. To discharging provider:  please sign these orders via discharge navigator,  Select New Orders & click on the button choice - Manage This Unsigned Work.     Thank you for allowing pharmacy to be a part of this patient's care.  Doreene Eland, PharmD, BCPS.   Work Cell: [phone] 01/22/2019 10:40 AM
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
## 2019-01-22 NOTE — Progress Notes (Signed)
Physical Therapy Treatment Patient Details Name: Eric Santos MRN: [id number] DOB: January 07, 1946 Today's Date: 01/22/2019    History of Present Illness Patient is 73 y.o. male s/p radical irrigation and debridement of infected total knee arthroplasty with PMH significant for DM, OA, HLD, A-fib, and OSA.    PT Comments    Pt assisted with ambulating in hallway. Pt reports being in bone foam for 2 hours this morning.  Pt agreeable to perform ankle pumps, quad sets and hip abduction a few times a day.   Pt likely to d/c home tomorrow.    Follow Up Recommendations  Follow surgeon's recommendation for DC plan and follow-up therapies     Equipment Recommendations  None recommended by PT    Recommendations for Other Services       Precautions / Restrictions Precautions Precautions: Fall;Knee Precaution Comments: "do not push ROM which may stir bleeding" from 11/19 note Restrictions RLE Weight Bearing: Weight bearing as tolerated    Mobility  Bed Mobility Overal bed mobility: Needs Assistance Bed Mobility: Supine to Sit     Supine to sit: Min guard;HOB elevated        Transfers Overall transfer level: Needs assistance Equipment used: Rolling walker (2 wheeled) Transfers: Sit to/from Stand Sit to Stand: Min guard         General transfer comment: cues for safe hand placement and technique with RW, no assist required to initiate power up  Ambulation/Gait Ambulation/Gait assistance: Min guard Gait Distance (Feet): 240 Feet Assistive device: Rolling walker (2 wheeled) Gait Pattern/deviations: Step-through pattern;Decreased weight shift to right;Antalgic     General Gait Details: verbal cues for use of RW, step length, posture; required one standing rest break   Stairs             Wheelchair Mobility    Modified Rankin (Stroke Patients Only)       Balance                                            Cognition Arousal/Alertness:  Awake/alert Behavior During Therapy: WFL for tasks assessed/performed Overall Cognitive Status: Within Functional Limits for tasks assessed                                        Exercises      General Comments        Pertinent Vitals/Pain Pain Assessment: 0-10 Pain Score: 2  Pain Location: right knee Pain Descriptors / Indicators: Aching;Sore Pain Intervention(s): Monitored during session;Repositioned    Home Living                      Prior Function            PT Goals (current goals can now be found in the care plan section) Progress towards PT goals: Progressing toward goals    Frequency    7X/week      PT Plan Current plan remains appropriate    Co-evaluation              AM-PAC PT "6 Clicks" Mobility   Outcome Measure  Help needed turning from your back to your side while in a flat bed without using bedrails?: A Little Help needed moving from lying on your back to sitting on  the side of a flat bed without using bedrails?: A Little Help needed moving to and from a bed to a chair (including a wheelchair)?: A Little Help needed standing up from a chair using your arms (e.g., wheelchair or bedside chair)?: A Little Help needed to walk in hospital room?: A Little Help needed climbing 3-5 steps with a railing? : A Little 6 Click Score: 18    End of Session Equipment Utilized During Treatment: Gait belt Activity Tolerance: Patient tolerated treatment well Patient left: with call bell/phone within reach;in chair;with family/visitor present   PT Visit Diagnosis: Muscle weakness (generalized) (M62.81);Difficulty in walking, not elsewhere classified (R26.2)     Time: [id number] PT Time Calculation (min) (ACUTE ONLY): 19 min  Charges:  $Gait Training: 8-22 mins                     Carmelia Bake, PT, DPT Acute Rehabilitation Services Office: [phone] Pager: [phone]  Eric Santos 01/22/2019, 1:12 PM

## 2019-01-22 NOTE — Progress Notes (Signed)
Pharmacy Antibiotic Note  Eric Santos is a 73 y.o. male admitted on 01/19/2019 with septic right knee.  Pharmacy has been consulted for Vancomycin dosing.  Plan is for patient to discharge on daptomycin.  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    BMI = 41  Plan:  Add CK to am labs  Give daptomycin 700mg  IV x 1 then q24h (~8mg /kg based on adj BW for BMI > 30  Note he is on statin with rosuvastain  See OPAT note/orders for discharge   Height: 5\' 6"  (167.6 cm) Weight: 258 lb 6.1 oz (117.2 kg) IBW/kg (Calculated) : 63.8  Temp (24hrs), Avg:98.2 F (36.8 C), Min:97.8 F (36.6 C), Max:98.9 F (37.2 C)  Recent Labs  Lab 01/19/19 1833 01/20/19 0451 01/21/19 0329 01/22/19 0331  WBC 14.3* 12.1* 11.8* 8.8  CREATININE 0.85  --  0.70  --   LATICACIDVEN 1.6  --   --   --     Estimated Creatinine Clearance: 100.6 mL/min (by C-G formula based on SCr of 0.7 mg/dL).    Allergies  Allergen Reactions  . Adhesive [Tape] Other (See Comments)    "Peels off skin"    Antimicrobials this admission: Vancomycin 01/19/2019 >>  Dose adjustments this admission: -  Microbiology results: -  Thank you for allowing pharmacy to be a part of this patient's care.  Doreene Eland, PharmD, BCPS.   Work Cell: [phone] 01/22/2019 10:38 AM

## 2019-01-22 NOTE — Progress Notes (Signed)
Subjective: No new complaints   Antibiotics:  Anti-infectives (From admission, onward)   Start     Dose/Rate Route Frequency Ordered Stop   01/22/19 1200  DAPTOmycin (CUBICIN) 700 mg in sodium chloride 0.9 % IVPB     700 mg 228 mL/hr over 30 Minutes Intravenous Daily 01/22/19 1029     01/20/19 2100  vancomycin (VANCOCIN) 1,750 mg in sodium chloride 0.9 % 500 mL IVPB  Status:  Discontinued     1,750 mg 250 mL/hr over 120 Minutes Intravenous Every 24 hours 01/20/19 0446 01/22/19 1012   01/20/19 1135  tobramycin (NEBCIN) powder  Status:  Discontinued       As needed 01/20/19 1135 01/20/19 1438   01/20/19 1134  vancomycin (VANCOCIN) powder  Status:  Discontinued       As needed 01/20/19 1134 01/20/19 1438   01/19/19 2130  vancomycin (VANCOCIN) 2,000 mg in sodium chloride 0.9 % 500 mL IVPB     2,000 mg 250 mL/hr over 120 Minutes Intravenous  Once 01/19/19 2121 01/20/19 0026      Medications: Scheduled Meds: . apixaban  2.5 mg Oral Q12H  . celecoxib  200 mg Oral BID  . Chlorhexidine Gluconate Cloth  6 each Topical Daily  . docusate sodium  100 mg Oral BID  . fentaNYL (SUBLIMAZE) injection  50-100 mcg Intravenous UD  . gabapentin  100 mg Oral TID  . metoprolol tartrate  12.5 mg Oral BID  . midazolam  1-2 mg Intravenous UD  . pantoprazole  40 mg Oral Daily  . rosuvastatin  10 mg Oral Daily  . tamsulosin  0.4 mg Oral Daily   Continuous Infusions: . sodium chloride 75 mL/hr at 01/21/19 2012  . DAPTOmycin (CUBICIN)  IV    . dextrose 5 % and 0.45 % NaCl with KCl 20 mEq/L Stopped (01/21/19 0944)  . lactated ringers Stopped (01/20/19 1712)  . methocarbamol (ROBAXIN) IV     PRN Meds:.acetaminophen, alum & mag hydroxide-simeth, bisacodyl, diphenhydrAMINE, HYDROmorphone (DILAUDID) injection, menthol-cetylpyridinium **OR** phenol, methocarbamol **OR** methocarbamol (ROBAXIN) IV, metoCLOPramide **OR** metoCLOPramide (REGLAN) injection, ondansetron **OR** ondansetron (ZOFRAN)  IV, oxyCODONE, polyethylene glycol, sodium chloride flush, sodium phosphate, zolpidem    Objective: Weight change:   Intake/Output Summary (Last 24 hours) at 01/22/2019 1209 Last data filed at 01/22/2019 0900 Gross per 24 hour  Intake 2270 ml  Output 860 ml  Net 1410 ml   Blood pressure 122/78, pulse 88, temperature 98 F (36.7 C), temperature source Oral, resp. rate 16, height '5\' 6"'$  (1.676 m), weight 117.2 kg, SpO2 96 %. Temp:  [97.8 F (36.6 C)-98.9 F (37.2 C)] 98 F (36.7 C) (11/20 0946) Pulse Rate:  [84-101] 88 (11/20 0946) Resp:  [16-18] 16 (11/20 0946) BP: (102-122)/(68-83) 122/78 (11/20 0946) SpO2:  [95 %-99 %] 96 % (11/20 0946)  Physical Exam: General: Alert and awake, oriented x3, not in any acute distress. HEENT: anicteric sclera, EOMI CVS regular rate, normal  Chest: , no wheezing, no respiratory distress Abdomen: soft non-distended,  Extremities: knee with dressing Skin: tetracycline rash Neuro: nonfocal  CBC:    BMET Recent Labs    01/19/19 1833 01/21/19 0329  NA 138 134*  K 4.3 4.1  CL 102 105  CO2 26 22  GLUCOSE 126* 141*  BUN 12 10  CREATININE 0.85 0.70  CALCIUM 9.1 7.8*     Liver Panel  No results for input(s): PROT, ALBUMIN, AST, ALT, ALKPHOS, BILITOT, BILIDIR, IBILI in the last 72 hours.  Sedimentation Rate Recent Labs    01/22/19 0331  ESRSEDRATE 46*   C-Reactive Protein Recent Labs    01/20/19 0451 01/22/19 0724  CRP 18.4* <0.8    Micro Results: Recent Results (from the past 720 hour(s))  Gram stain     Status: None   Collection Time: 01/19/19  6:34 PM   Specimen: Joint, Knee; Body Fluid  Result Value Ref Range Status   Specimen Description JOINT FLUID KNEE  Final   Special Requests Normal  Final   Gram Stain   Final    FEW Predominantly Neutrophils Seen NO EPITHELIAL CELLS SEEN  NO ORGANISMS SEEN CRITICAL RESULT CALLED TO, READ BACK BY AND VERIFIED WITH: RN Dola Argyle AT 2133 01/19/19 CRUICKSHANK A  Performed at [facility], [place] [address], [place], [place] [address]    Report Status 01/20/2019 FINAL  Final  Culture, body fluid-bottle     Status: Abnormal   Collection Time: 01/19/19  6:34 PM   Specimen: Synovium  Result Value Ref Range Status   Specimen Description SYNOVIAL  Final   Special Requests KNEE  Final   Gram Stain   Final    GRAM POSITIVE COCCI CRITICAL RESULT CALLED TO, READ BACK BY AND VERIFIED WITH: C KATZ RN 01/20/19 2024 JDW IN BOTH AEROBIC AND ANAEROBIC BOTTLES Performed at [facility], [place] [address] [place], [place] [address]    Culture STAPHYLOCOCCUS EPIDERMIDIS (A)  Final   Report Status 01/22/2019 FINAL  Final   Organism ID, Bacteria STAPHYLOCOCCUS EPIDERMIDIS  Final      Susceptibility   Staphylococcus epidermidis - MIC*    CIPROFLOXACIN <=0.5 SENSITIVE Sensitive     ERYTHROMYCIN >=8 RESISTANT Resistant     GENTAMICIN <=0.5 SENSITIVE Sensitive     OXACILLIN <=0.25 SENSITIVE Sensitive     TETRACYCLINE >=16 RESISTANT Resistant     VANCOMYCIN 1 SENSITIVE Sensitive     TRIMETH/SULFA >=320 RESISTANT Resistant     CLINDAMYCIN RESISTANT Resistant     RIFAMPIN <=0.5 SENSITIVE Sensitive     Inducible Clindamycin POSITIVE Resistant     * STAPHYLOCOCCUS EPIDERMIDIS  Blood culture (routine x 2)     Status: None (Preliminary result)   Collection Time: 01/19/19  7:00 PM   Specimen: BLOOD  Result Value Ref Range Status   Specimen Description   Final    BLOOD RIGHT ANTECUBITAL Performed at [facility], [place] [address], [place], [place] [address]    Special Requests   Final    BOTTLES DRAWN AEROBIC AND ANAEROBIC Blood Culture adequate volume Performed at [place] [address], [place], [place] [address]    Culture   Final    NO GROWTH 3 DAYS Performed at [facility], [place] [address], [place], [place] [address]    Report Status PENDING  Incomplete  Blood culture (routine x 2)      Status: None (Preliminary result)   Collection Time: 01/19/19  7:06 PM   Specimen: BLOOD LEFT HAND  Result Value Ref Range Status   Specimen Description   Final    BLOOD LEFT HAND Performed at [place] [address] [place], [place] [address]    Special Requests   Final    BOTTLES DRAWN AEROBIC AND ANAEROBIC Blood Culture results may not be optimal due to an inadequate volume of blood received in culture bottles Performed at [place] [address] [place], [place] [address]  Culture   Final    NO GROWTH 3 DAYS Performed at [facility], [place] [address] [place], [place] [address]    Report Status PENDING  Incomplete  SARS CORONAVIRUS 2 (TAT 6-24 HRS) Nasopharyngeal Nasopharyngeal Swab     Status: None   Collection Time: 01/20/19 12:18 AM   Specimen: Nasopharyngeal Swab  Result Value Ref Range Status   SARS Coronavirus 2 NEGATIVE NEGATIVE Final    Comment: (NOTE) SARS-CoV-2 target nucleic acids are NOT DETECTED. The SARS-CoV-2 RNA is generally detectable in upper and lower respiratory specimens during the acute phase of infection. Negative results do not preclude SARS-CoV-2 infection, do not rule out co-infections with other pathogens, and should not be used as the sole basis for treatment or other patient management decisions. Negative results must be combined with clinical observations, patient history, and epidemiological information. The expected result is Negative. Fact Sheet for Patients: SugarRoll.be Fact Sheet for Healthcare Providers: https://www.woods-mathews.com/ This test is not yet approved or cleared by the [place] FDA and  has been authorized for detection and/or diagnosis of SARS-CoV-2 by FDA under an Emergency Use Authorization (EUA). This EUA will remain  in effect (meaning this test can be used) for the duration of the COVID-19 declaration under Section 56  4(b)(1) of the Act, 21 U.S.C. section 360bbb-3(b)(1), unless the authorization is terminated or revoked sooner. Performed at [facility], [place] [address], [place], [place] [address]   Surgical pcr screen     Status: None   Collection Time: 01/20/19  7:55 AM   Specimen: Nasal Mucosa; Nasal Swab  Result Value Ref Range Status   MRSA, PCR NEGATIVE NEGATIVE Final   Staphylococcus aureus NEGATIVE NEGATIVE Final    Comment: (NOTE) The Xpert SA Assay (FDA approved for NASAL specimens in patients 32 years of age and older), is one component of a comprehensive surveillance program. It is not intended to diagnose infection nor to guide or monitor treatment. Performed at [facility], [place] [address], [place], [place] [address]   Aerobic/Anaerobic Culture (surgical/deep wound)     Status: None (Preliminary result)   Collection Time: 01/20/19 11:13 AM   Specimen: PATH Soft tissue  Result Value Ref Range Status   Specimen Description   Final    TISSUE SYNOVIUM RIGHT TOTAL KNEE Performed at [place] [address], [place], [place] [address]    Special Requests   Final    NONE Performed at [facility], [place] [address], [place], [place] [address]    Gram Stain   Final    FEW WBC PRESENT,BOTH PMN AND MONONUCLEAR NO ORGANISMS SEEN    Culture   Final    CULTURE REINCUBATED FOR BETTER GROWTH Performed at [facility], [place] [address], [place], [place] [address]    Report Status PENDING  Incomplete    Studies/Results: [place] Ekg Site Rite  Result Date: 01/21/2019 If Site Rite image not attached, placement could not be confirmed due to current cardiac rhythm.     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 TMP/SMX, clinda R  I think given the gravity of his infection I need to cover both the [place] and the MRSA from before   We will plan on daptomycin into early January and then switch to suppressive po antibiotics again with likely minocycline AND keflex  Diagnosis: PJI  Culture Result: MS Coag NEg STAPH and prior MRSA  Allergies  Allergen Reactions  . Adhesive [Tape] Other (See Comments)    "Peels off skin"    OPAT Orders Discharge antibiotics:  Daptomycin   Duration:  7 weeks  End Date:  January 6th, 2020  [place] Per Protocol:   Labs  weekly while on IV antibiotics: _x_ CBC with differential _x_ BMP w GFR/CMP x__ CRP x__ ESR  __ Vancomycin trough x__ CK  _x_ Please pull PIC at completion of IV antibiotics __ Please leave PIC in place until doctor has seen patient or been notified  Fax weekly labs to [phone]  Clinic Follow Up Appt:   CAYSON KALB has an appointment on January 5th, 2021 with Dr Tommy Medal at [facility] Disease is located in the [facility] at  [place] in [place].  [address], which is located to the left of the elevators.  Phone: [phone]  Fax: [phone]  [email or web address]  IF he comes in person he should arrive 15 minutes prior to the appointment. Given severity of COVID 19 we are seeing majority of our patients virtually in coming weeks and we may choose to convert his to an evisit.  Dr. Prince Rome is available this weekend for questions and will followup his intraoperative cultures.  I am fine with DC to home on daptomycin.     LOS: 3 days   Alcide Evener 01/22/2019, 12:09 PM

## 2019-01-22 NOTE — Care Management Important Message (Signed)
Important Message  Patient Details IM Letter given to Kathrin Greathouse SW to present to the Patient Name: Eric Santos MRN: [id number] Date of Birth: 1945/05/23   Medicare Important Message Given:  Yes     Kerin Salen 01/22/2019, 1:46 PM

## 2019-01-23 LAB — HEPATITIS B SURFACE ANTIBODY, QUANTITATIVE: Hep B S AB Quant (Post): <3.1 m[IU]/mL — ABNORMAL LOW (ref >9.9)

## 2019-01-24 ENCOUNTER — Emergency Department (HOSPITAL_COMMUNITY): Payer: Medicare Other

## 2019-01-24 ENCOUNTER — Emergency Department (HOSPITAL_COMMUNITY)
Admission: EM | Admit: 2019-01-24 | Discharge: 2019-01-25 | Disposition: A | Discharge status: Home / Self Care | Payer: Medicare Other | Attending: Emergency Medicine | Admitting: Emergency Medicine

## 2019-01-24 ENCOUNTER — Other Ambulatory Visit: Payer: Self-pay

## 2019-01-24 ENCOUNTER — Encounter (HOSPITAL_COMMUNITY): Payer: Self-pay | Admitting: Emergency Medicine

## 2019-01-24 DIAGNOSIS — Z5321 Procedure and treatment not carried out due to patient leaving prior to being seen by health care provider: Secondary | ICD-10-CM | POA: Insufficient documentation

## 2019-01-24 DIAGNOSIS — R002 Palpitations: Secondary | ICD-10-CM | POA: Diagnosis present

## 2019-01-24 LAB — CULTURE, BLOOD (ROUTINE X 2)
Culture: NO GROWTH
Culture: NO GROWTH
Special Requests: ADEQUATE

## 2019-01-24 LAB — CBC WITH DIFFERENTIAL/PLATELET
Abs Immature Granulocytes: 0.03 10*3/uL (ref 0.00–0.07)
Basophils Absolute: 0.1 10*3/uL (ref 0.0–0.1)
Basophils Relative: 1 %
Eosinophils Absolute: 0.5 10*3/uL (ref 0.0–0.5)
Eosinophils Relative: 5 %
HCT: 41.8 % (ref 39.0–52.0)
Hemoglobin: 13.7 g/dL (ref 13.0–17.0)
Immature Granulocytes: 0 %
Lymphocytes Relative: 24 %
Lymphs Abs: 2.3 10*3/uL (ref 0.7–4.0)
MCH: 30.8 pg (ref 26.0–34.0)
MCHC: 32.8 g/dL (ref 30.0–36.0)
MCV: 93.9 fL (ref 80.0–100.0)
Monocytes Absolute: 1 10*3/uL (ref 0.1–1.0)
Monocytes Relative: 11 %
Neutro Abs: 5.9 10*3/uL (ref 1.7–7.7)
Neutrophils Relative %: 59 %
Platelets: 339 10*3/uL (ref 150–400)
RBC: 4.45 MIL/uL (ref 4.22–5.81)
RDW: 12.8 % (ref 11.5–15.5)
WBC: 9.9 10*3/uL (ref 4.0–10.5)
nRBC: 0 % (ref 0.0–0.2)

## 2019-01-24 LAB — COMPREHENSIVE METABOLIC PANEL
ALT: 36 U/L (ref 0–44)
AST: 25 U/L (ref 15–41)
Albumin: 2.8 g/dL — ABNORMAL LOW (ref 3.5–5.0)
Alkaline Phosphatase: 47 U/L (ref 38–126)
Anion gap: 9 (ref 5–15)
BUN: 10 mg/dL (ref 8–23)
CO2: 24 mmol/L (ref 22–32)
Calcium: 9 mg/dL (ref 8.9–10.3)
Chloride: 107 mmol/L (ref 98–111)
Creatinine, Ser: 0.84 mg/dL (ref 0.61–1.24)
GFR calc Af Amer: >60 mL/min (ref >60)
GFR calc non Af Amer: >60 mL/min (ref >60)
Glucose, Bld: 142 mg/dL — ABNORMAL HIGH (ref 70–99)
Potassium: 4.4 mmol/L (ref 3.5–5.1)
Sodium: 140 mmol/L (ref 135–145)
Total Bilirubin: 0.2 mg/dL — ABNORMAL LOW (ref 0.3–1.2)
Total Protein: 6.1 g/dL — ABNORMAL LOW (ref 6.5–8.1)

## 2019-01-24 LAB — PROTIME-INR
INR: 1 (ref 0.8–1.2)
Prothrombin Time: 12.7 seconds (ref 11.4–15.2)

## 2019-01-24 LAB — TROPONIN I (HIGH SENSITIVITY): Troponin I (High Sensitivity): 6 ng/L (ref <18)

## 2019-01-24 NOTE — ED Triage Notes (Signed)
Patient reports palpitations since yesterday , denies SOB , no chest pain , no cough or fever .

## 2019-01-25 ENCOUNTER — Telehealth: Payer: Self-pay | Admitting: Cardiovascular Disease

## 2019-01-25 DIAGNOSIS — I480 Paroxysmal atrial fibrillation: Secondary | ICD-10-CM

## 2019-01-25 DIAGNOSIS — I471 Supraventricular tachycardia: Secondary | ICD-10-CM

## 2019-01-25 LAB — AEROBIC/ANAEROBIC CULTURE W GRAM STAIN (SURGICAL/DEEP WOUND)

## 2019-01-25 LAB — TROPONIN I (HIGH SENSITIVITY): Troponin I (High Sensitivity): 6 ng/L

## 2019-01-25 MED ORDER — METOPROLOL TARTRATE 50 MG PO TABS: 50.0000 mg | ORAL_TABLET | Freq: Two times a day (BID) | ORAL | 3 refills | Status: DC

## 2019-01-25 NOTE — ED Notes (Signed)
Pt was picked up by family. Pt wife really upset that pt was waiting Pt advised against leaving AMA and the risks associated with leaving.

## 2019-01-25 NOTE — Telephone Encounter (Signed)
Yes

## 2019-01-25 NOTE — Telephone Encounter (Signed)
Pt c/o medication issue:  1. Name of Medication: daptomycin (CUBICIN) IVPB  2. How are you currently taking this medication (dosage and times per day)? No, hasn't taken since Saturday at 7:00pm  3. Are you having a reaction (difficulty breathing--STAT)? No   4. What is your medication issue? Patients wife is calling in regards to being concerned about patients HR after receiving daptomycin (CUBICIN) IVPB injection. HR ranging from 122, 145, 151, 154, 128 after injection.  Patient was given flecainide (TAMBOCOR) 150 MG tablet to drop HR down to 72, but after awhile HR went back up. Patient went to the ER at 9:00 PM last night HR was 149. They performed an EKG and then sent him home. Patients wife thinks HR is due to injections. Please advise.

## 2019-01-25 NOTE — Telephone Encounter (Signed)
Called patient's wife and informed her of Dr. Kyla Balzarine recommendations. Patient will come in tomorrow to see DOD, Dr. Curt Bears. Will order 14 day monitor. Changed metoprolol to 50 mg BID and sent to patient's pharmacy. Patient's wife asked, should patient continue the IV antibiotic? Will forward to Dr. Johnsie Cancel to see if it is okay to continue.

## 2019-01-25 NOTE — Telephone Encounter (Signed)
He has had PAF and SVT needs 14 day monitor and be seen in office preferably by EP this week to see if he is in afib or not for now can increase metoprolol to 50 bid

## 2019-01-25 NOTE — Telephone Encounter (Signed)
Called patient's wife (DPR) back. Patient is having irregular HR/ elevated HR after his IV PB of antibiotics daptomycin, HR as high as 122 to 154). Patient is suppose to have daptomycin daily for 6 weeks. Patient did not have it on Sunday or today because of his HR. Patient does not have any chest pain, SOB or any other symptoms when his HR is elevated. Patient stated he does not even feel any different. Patient's wife stated that patient's infectious disease doctor said this antibiotic is the best for his infection. Patient has taken flecainide 150 mg on Saturday that helped, but has not taken any today. Patient takes Metoprolol 25 mg BID and Flecainide 150 mg PRN. Today patient's HR was 140 before morning medications, after 78 (9:00 am), 120 (10 am), 86 (11 am), 96 (12 pm).  Patient's wanted to get recommendation from Dr. Johnsie Cancel. Will forward to Dr. Johnsie Cancel for advisement.

## 2019-01-25 NOTE — Telephone Encounter (Signed)
Informed patient's wife that patient needs to continue IV antibiotics.

## 2019-01-26 ENCOUNTER — Ambulatory Visit: Payer: Medicare Other | Admitting: Cardiology

## 2019-01-26 ENCOUNTER — Encounter: Payer: Self-pay | Admitting: Cardiology

## 2019-01-26 ENCOUNTER — Other Ambulatory Visit: Payer: Self-pay

## 2019-01-26 ENCOUNTER — Ambulatory Visit: Payer: Medicare Other | Admitting: Internal Medicine

## 2019-01-26 VITALS — BP 98/68 | HR 95 | Ht 66.0 in | Wt 263.0 lb

## 2019-01-26 DIAGNOSIS — I483 Typical atrial flutter: Secondary | ICD-10-CM

## 2019-01-26 MED ORDER — FLECAINIDE ACETATE 100 MG PO TABS: 100.0000 mg | ORAL_TABLET | Freq: Two times a day (BID) | ORAL | 3 refills | Status: DC

## 2019-01-26 MED ORDER — APIXABAN 5 MG PO TABS: 5.0000 mg | ORAL_TABLET | Freq: Two times a day (BID) | ORAL | 6 refills | Status: DC
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
## 2019-01-26 NOTE — Patient Instructions (Addendum)
Medication Instructions:  Your physician has recommended you make the following change in your medication:  1. START Flecainide 100 mg TWICE a day -- YOU WILL START THIS MEDICATION THE DAY OF YOUR CARDIOVERSION. 2. INCREASE Eliquis to 5 mg TWICE a day  * If you need a refill on your cardiac medications before your next appointment, please call your pharmacy.   Labwork: None ordered  Testing/Procedures: Your physician has requested that you have a TEE/Cardioversion. During a TEE, sound waves are used to create images of your heart. It provides your doctor with information about the size and shape of your heart and how well your hearts chambers and valves are working. In this test, a transducer is attached to the end of a flexible tube that is guided down you throat and into your esophagus (the tube leading from your mouth to your stomach) to get a more detailed image of your heart. Once the TEE has determined that a blood clot is not present, the cardioversion begins. Electrical Cardioversion uses a jolt of electricity to your heart either through paddles or wired patches attached to your chest. This is a controlled, usually prescheduled, procedure. This procedure is done at the hospital and you are not awake during the procedure. You usually go home the day of the procedure. Please see the instructions below.  Follow-Up: At [facility], you and your health needs are our priority.  As part of our continuing mission to provide you with exceptional heart care, we have created designated Provider Care Teams.  These Care Teams include your primary Cardiologist (physician) and Advanced Practice Providers (APPs -  Physician Assistants and Nurse Practitioners) who all work together to provide you with the care you need, when you need it.  You will need a follow up appointment in 3 months.  Please call our office 2 months in advance to schedule this appointment.  You may see Dr Curt Bears or one of the  following Advanced Practice Providers on your designated Care Team:    Chanetta Marshall, NP  Tommye Standard, PA-C  Oda Kilts, [place]  Thank you for choosing [facility]!!   Trinidad Curet, RN [phone]  Any Other Special Instructions Will Be Listed Below (If Applicable).     COVID TEST-- On 02/02/19 @ 11:30 am  - You will go to [facility] ([place]) for your Covid testing.   This is a drive thru test site.  There will be multiple testing areas.  Be sure to share with the first checkpoint that you are there for pre-procedure/surgery testing. This will put you into the right (yellow) lane that leads to the PAT testing team. Stay in your car and the nurse team will come to your car to test you.  After you are tested please go home and self quarantine until the day of your procedure.                               TEE/CARDIOVERSION INSTRUCTIONS  You are scheduled for a TEE/CARDIOVERSION on 02/05/19 with Dr. Harrington Challenger.  Please arrive at the [facility], Entrance "A" of [facility] at 8:30  AM the day of your procedure. (the address is: [address])  1.) Diet:  A.) Nothing to eat or drink after midnight except your medications with a sip of water. (see medication instructions below)  2.)   Medication Instructions: Hold your morning medications the  morning of the procedure Continue your anticoagulant: Eliquis You will need to continue your anticoagulant after your procedure until you are told by your provider that it is safe to stop  4.) You must have a responsible person to drive you home and stay in the waiting area during your procedure. Failure to do so could result in cancellation.   5.) Bring your current insurance cards and current list of all your medications.  *Special Note:  Every effort is made to have your procedure done on time.  Occasionally there are emergencies that present themselves at the hospital that may cause delays.   Please be patient if a delay does occur.  *If you have any questions after you get home, please call the office at [phone].    Transesophageal Echocardiogram Transesophageal echocardiography (TEE) is a special type of test that produces images of the heart by using sound waves (echocardiogram). This type of echocardiography can obtain better images of the heart than standard echocardiography. TEE is done by passing a flexible tube down the esophagus. The heart is located in front of the esophagus. Because the heart and esophagus are close to one another, your health care provider can take very clear, detailed pictures of the heart via ultrasound waves. TEE may be done:  If your health care provider needs more information based on standard echocardiography findings.  If you had a stroke. This might have happened because a clot formed in your heart. TEE can visualize different areas of the heart and check for clots.  To check valve anatomy and function.  To check for infection on the inside of your heart (endocarditis).  To evaluate the dividing wall (septum) of the heart and presence of a hole that did not close after birth (patent foramen ovale or atrial septal defect).  To help diagnose a tear in the wall of the aorta (aortic dissection).  During cardiac valve surgery. This allows the surgeon to assess the valve repair before closing the chest.  During a variety of other cardiac procedures to guide positioning of catheters.  Sometimes before a cardioversion, which is a shock to convert heart rhythm back to normal.  Tell a health care provider about:  Any allergies you have.  All medicines you are taking, including vitamins, herbs, eye drops, creams, and over-the-counter medicines.  Any problems you or family members have had with anesthetic medicines.  Any blood disorders you have.  Any surgeries you have had.  Any medical conditions you have.  Swallowing  difficulties.  An esophageal obstruction. What are the risks? Generally, TEE is a safe procedure. However, as with any procedure, complications can occur. Possible complications include an esophageal tear (rupture). What happens before the procedure?  Do not eat or drink for 6 hours before the procedure or as directed by your health care provider.  Arrange for someone to drive you home after the procedure. Do not drive yourself home. During the procedure, you will be given medicines that can continue to make you feel drowsy and can impair your reflexes.  An IV access tube will be started in the arm. What happens during the procedure?  A medicine to help you relax (sedative) will be given through the IV access tube.  A medicine may be sprayed or gargled to numb the back of the throat.  Your blood pressure, heart rate, and breathing (vital signs) will be monitored during the procedure.  The TEE probe is a long, flexible tube. The tip of the  probe is placed into the back of the mouth, and you will be asked to swallow. This helps to pass the tip of the probe into the esophagus. Once the tip of the probe is in the correct area, your health care provider can take pictures of the heart.  TEE is usually not a painful procedure. You may feel the probe press against the back of the throat. The probe does not enter the trachea and does not affect your breathing. What happens after the procedure?  You will be in bed, resting, until you have fully returned to consciousness.  When you first awaken, your throat may feel slightly sore and will probably still feel numb. This will improve slowly over time.  You will not be allowed to eat or drink until it is clear that the numbness has improved.  Once you have been able to drink, urinate, and sit on the edge of the bed without feeling sick to your stomach (nausea) or dizzy, you may be cleared to go home.  You should have a friend or family member with  you for the next 24 hours after your procedure. This information is not intended to replace advice given to you by your health care provider. Make sure you discuss any questions you have with your health care provider. Document Released: 05/11/2002 Document Revised: 07/27/2015 Document Reviewed: 08/20/2012 Elsevier Interactive Patient Education  2018 [employer].    Hospital doctor cardioversion is the delivery of a jolt of electricity to restore a normal rhythm to the heart. A rhythm that is too fast or is not regular keeps the heart from pumping well. In this procedure, sticky patches or metal paddles are placed on the chest to deliver electricity to the heart from a device. This procedure may be done in an emergency if:  There is low or no blood pressure as a result of the heart rhythm.  Normal rhythm must be restored as fast as possible to protect the brain and heart from further damage.  It may save a life.  This procedure may also be done for irregular or fast heart rhythms that are not immediately life-threatening. Tell a health care provider about:  Any allergies you have.  All medicines you are taking, including vitamins, herbs, eye drops, creams, and over-the-counter medicines.  Any problems you or family members have had with anesthetic medicines.  Any blood disorders you have.  Any surgeries you have had.  Any medical conditions you have.  Whether you are pregnant or may be pregnant. What are the risks? Generally, this is a safe procedure. However, problems may occur, including:  Allergic reactions to medicines.  A blood clot that breaks free and travels to other parts of your body.  The possible return of an abnormal heart rhythm within hours or days after the procedure.  Your heart stopping (cardiac arrest ). This is rare.  What happens before the procedure? Medicines  Your health care provider may have you start taking: ? Blood-thinning  medicines (anticoagulants) so your blood does not clot as easily. ? Medicines may be given to help stabilize your heart rate and rhythm.  Ask your health care provider about changing or stopping your regular medicines. This is especially important if you are taking diabetes medicines or blood thinners. General instructions  Plan to have someone take you home from the hospital or clinic.  If you will be going home right after the procedure, plan to have someone with you for 24 hours.  Follow instructions from your health care provider about eating or drinking restrictions. What happens during the procedure?  To lower your risk of infection: ? Your health care team will wash or sanitize their hands. ? Your skin will be washed with soap.  An IV tube will be inserted into one of your veins.  You will be given a medicine to help you relax (sedative).  Sticky patches (electrodes) or metal paddles may be placed on your chest.  An electrical shock will be delivered. The procedure may vary among health care providers and hospitals. What happens after the procedure?  Your blood pressure, heart rate, breathing rate, and blood oxygen level will be monitored until the medicines you were given have worn off.  Do not drive for 24 hours if you were given a sedative.  Your heart rhythm will be watched to make sure it does not change. This information is not intended to replace advice given to you by your health care provider. Make sure you discuss any questions you have with your health care provider. Document Released: 02/08/2002 Document Revised: 10/18/2015 Document Reviewed: 08/25/2015 Elsevier Interactive Patient Education  2017 [employer].

## 2019-02-01 ENCOUNTER — Telehealth: Payer: Self-pay | Admitting: Internal Medicine

## 2019-02-01 ENCOUNTER — Encounter: Payer: Self-pay | Admitting: *Deleted

## 2019-02-01 NOTE — Telephone Encounter (Signed)
[place] for [employer] or virtual visit if pt unable  Would need brand of strips to refill as I dont see this on his list

## 2019-02-01 NOTE — Progress Notes (Signed)
Patient ID: Eric Santos, male   DOB: [birth year], 73 y.o.   MRN: [id number]    14 day ZIO XT long term holter monitor to be mailed to the patients home.  Estimated arrival 02/06/19.

## 2019-02-01 NOTE — Telephone Encounter (Signed)
Pt's wife called to report that Blood Sugar has spiked on the picc line antibiotic.  Also out of test strips Pt experiences shakes, chills, dry mouth, and dizziness. Has a hard time walking after infusion.   [facility] [id number] [address], [address]  [id number] [place] [address]  Phone: [phone] Fax: [phone]   Best contact: [phone]

## 2019-02-02 ENCOUNTER — Inpatient Hospital Stay (HOSPITAL_COMMUNITY): Admission: RE | Admit: 2019-02-02 | Payer: Medicare Other | Source: Ambulatory Visit

## 2019-02-02 ENCOUNTER — Ambulatory Visit (INDEPENDENT_AMBULATORY_CARE_PROVIDER_SITE_OTHER): Payer: Medicare Other | Admitting: Internal Medicine

## 2019-02-02 ENCOUNTER — Telehealth: Payer: Self-pay | Admitting: *Deleted

## 2019-02-02 ENCOUNTER — Telehealth: Payer: Self-pay | Admitting: Cardiology

## 2019-02-02 DIAGNOSIS — R7302 Impaired glucose tolerance (oral): Secondary | ICD-10-CM | POA: Diagnosis not present

## 2019-02-02 MED ORDER — ONETOUCH ULTRA VI STRP: ORAL_STRIP | 5 refills | Status: DC

## 2019-02-02 MED ORDER — METFORMIN HCL 500 MG PO TABS: 500.0000 mg | ORAL_TABLET | Freq: Two times a day (BID) | ORAL | 5 refills | Status: DC | PRN

## 2019-02-02 NOTE — Telephone Encounter (Signed)
Patient wife called to report that for about 1 week an hour after taking his IV medication he has the chills/shaking, nausea, spike in BP and blood sugar and they think it is a reaction to the med. She advised it seems to be getting worse and they want to know what to do. Advised will let the provider know and someone will give them a call back.  She denies rash at this time.

## 2019-02-02 NOTE — Telephone Encounter (Signed)
Appointment scheduled.
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
## 2019-02-02 NOTE — Telephone Encounter (Signed)
Starting last Wednesday he had a reaction to abx given to him for his knee. Makes blood sugar/BP/HR/temp go up. Doctor recommends holding off on DCCV, not be in the hospital, while healing/dealing with this. MD gave them Rx for Metformin b/c they think it his sugar spiking from infusion that could be causing everything else.  Wife reports that she tried this for first time today --  And so far HRs remained WNL. She reports normally HR 60-70s, sometimes 80s, has been doing much better since Dr Curt Bears increased his medications.   Aware I will discuss w/ Camnitz, call if he has sooner/different advisement.  Otherwise we agreed to speak w/i next several weeks to discuss rescheduling procedure. Wife verbalized understanding and agreeable to plan.

## 2019-02-02 NOTE — Telephone Encounter (Signed)
Patient's wife calling to speak with Dr. Curt Santos nurse in regards to his procedure on Friday 02/05/19 for TRANSESOPHAGEAL ECHOCARDIOGRAM (TEE). Patient states she does not think her husband will be able to make it. States he is having a reaction to a medication that was prescribed by another dr that is causing him not to feel good (daptomycin (CUBICIN) IVPB)

## 2019-02-03 NOTE — Telephone Encounter (Signed)
If he is having those fevers and symptoms he definitely needs to be in the ER and evaluated

## 2019-02-03 NOTE — Telephone Encounter (Signed)
I suspect his PICC line is infected and he should be seen in the ER for evaluation and treatment

## 2019-02-03 NOTE — Telephone Encounter (Signed)
Per Eric Santos called patient to advise to be seen at ED and wife advised she called his PCP and was given metformin for him to take prior to his dose if his glucose is above 150 and she says it is working. Advised her will tell [facility] but if anything changes to please call the office.

## 2019-02-04 ENCOUNTER — Telehealth: Payer: Self-pay | Admitting: Cardiology

## 2019-02-04 NOTE — Telephone Encounter (Signed)
Informed that it should be cancelled and apologized for not getting to it yet. Larene Beach will cancel the procedure. She appreciates my return call.

## 2019-02-04 NOTE — Telephone Encounter (Signed)
New Message    Eric Santos is calling from [facility] and is asking for Sherri to call her back    Please call

## 2019-02-04 NOTE — Telephone Encounter (Signed)
Per Tommy Medal called patient back to see how he is doing and that [employer] things he should be evaluated at the ED. He says he feels much better and has not had a problem since starting the Metformin to help control his blood sugar. He also states that he has spent several hours in the ED and has not confidence they can help him. He agreed to call us if anything changes. Advised will let Tommy Medal know what is going on.

## 2019-02-05 ENCOUNTER — Ambulatory Visit (HOSPITAL_COMMUNITY): Admission: RE | Admit: 2019-02-05 | Payer: Medicare Other | Source: Home / Self Care | Admitting: Internal Medicine

## 2019-02-05 ENCOUNTER — Encounter (HOSPITAL_COMMUNITY): Admission: RE | Payer: Self-pay | Source: Home / Self Care

## 2019-02-05 SURGERY — ECHOCARDIOGRAM, TRANSESOPHAGEAL
Anesthesia: Monitor Anesthesia Care

## 2019-02-07 ENCOUNTER — Encounter: Payer: Self-pay | Admitting: Internal Medicine

## 2019-02-07 NOTE — Assessment & Plan Note (Signed)
See notes

## 2019-02-07 NOTE — Patient Instructions (Signed)
Ok for metformin bid prn > 180  Please continue all other medications as before, and refills have been done if requested.  Please have the pharmacy call with any other refills you may need.  Please continue your efforts at being more active, low cholesterol diet, and weight control.  Please keep your appointments with your specialists as you may have planned

## 2019-02-08 NOTE — Telephone Encounter (Signed)
Very good I still do not understand why would have had fevers though

## 2019-02-12 ENCOUNTER — Telehealth: Payer: Self-pay

## 2019-02-12 NOTE — Telephone Encounter (Signed)
Patient's wife called office to confirm her husband's picc line wont have any issue with heart monitor that cardiology sent for patient. Informed patient's wife that there shouldn't be any issues with picc line, but she would like to confirm with MD. Will forward message  Aundria Rud, [place]

## 2019-02-13 NOTE — Telephone Encounter (Signed)
Should not be a problem

## 2019-02-15 ENCOUNTER — Ambulatory Visit (INDEPENDENT_AMBULATORY_CARE_PROVIDER_SITE_OTHER): Payer: Medicare Other

## 2019-02-15 DIAGNOSIS — I471 Supraventricular tachycardia: Secondary | ICD-10-CM | POA: Diagnosis not present

## 2019-02-15 DIAGNOSIS — I480 Paroxysmal atrial fibrillation: Secondary | ICD-10-CM

## 2019-02-18 NOTE — Telephone Encounter (Signed)
Followed up with pt & wife about rescheduling procedure. They inform me that pt is doing better since medication increase by Dr. Curt Bears.  Pt knows when he is in/out of rhythm and reports being in rhythm since medication increase and states his HR hasn't been over 100 since increase. Pt just started a 2 week holter monitor on Monday (ordered by [facility]). Wife reports that infectious dz doctor wants him to wait until after PICC line is out on 03/09/19 before deciding on any procedure  (staff infection of prosthetic knee). She also reports he sees his "knee doctor" day after on 03/10/19. Pt is scheduled for routine follow up with Dr. Curt Bears on 2/22.  Wife & pt would like to know if ok to wait until that follow up before deciding if DCCV still needed. Agreeable to calling if issues arise prior to that scheduled follow up. Aware I will have Dr. Curt Bears review and advise.  Aware it may be next week before I follow up with them. Patient & wife verbalized understanding and agreeable to plan.

## 2019-02-27 ENCOUNTER — Inpatient Hospital Stay (HOSPITAL_COMMUNITY)
Admission: EM | Admit: 2019-02-27 | Discharge: 2019-03-04 | DRG: 309 | Disposition: A | Discharge status: Home Health | Payer: Medicare Other | Attending: Internal Medicine | Admitting: Internal Medicine

## 2019-02-27 ENCOUNTER — Other Ambulatory Visit: Payer: Self-pay

## 2019-02-27 ENCOUNTER — Emergency Department (HOSPITAL_COMMUNITY): Payer: Medicare Other

## 2019-02-27 ENCOUNTER — Encounter (HOSPITAL_COMMUNITY): Payer: Self-pay | Admitting: Emergency Medicine

## 2019-02-27 DIAGNOSIS — Z7901 Long term (current) use of anticoagulants: Secondary | ICD-10-CM

## 2019-02-27 DIAGNOSIS — E785 Hyperlipidemia, unspecified: Secondary | ICD-10-CM | POA: Diagnosis present

## 2019-02-27 DIAGNOSIS — Z20828 Contact with and (suspected) exposure to other viral communicable diseases: Secondary | ICD-10-CM | POA: Diagnosis present

## 2019-02-27 DIAGNOSIS — E1169 Type 2 diabetes mellitus with other specified complication: Secondary | ICD-10-CM

## 2019-02-27 DIAGNOSIS — Z6839 Body mass index (BMI) 39.0-39.9, adult: Secondary | ICD-10-CM

## 2019-02-27 DIAGNOSIS — T8453XA Infection and inflammatory reaction due to internal right knee prosthesis, initial encounter: Secondary | ICD-10-CM | POA: Diagnosis present

## 2019-02-27 DIAGNOSIS — E1165 Type 2 diabetes mellitus with hyperglycemia: Secondary | ICD-10-CM | POA: Diagnosis present

## 2019-02-27 DIAGNOSIS — I4819 Other persistent atrial fibrillation: Secondary | ICD-10-CM | POA: Diagnosis not present

## 2019-02-27 DIAGNOSIS — I4891 Unspecified atrial fibrillation: Secondary | ICD-10-CM | POA: Diagnosis present

## 2019-02-27 DIAGNOSIS — R5381 Other malaise: Secondary | ICD-10-CM | POA: Diagnosis present

## 2019-02-27 DIAGNOSIS — I2510 Atherosclerotic heart disease of native coronary artery without angina pectoris: Secondary | ICD-10-CM | POA: Diagnosis present

## 2019-02-27 DIAGNOSIS — B9689 Other specified bacterial agents as the cause of diseases classified elsewhere: Secondary | ICD-10-CM | POA: Diagnosis present

## 2019-02-27 DIAGNOSIS — T8453XD Infection and inflammatory reaction due to internal right knee prosthesis, subsequent encounter: Secondary | ICD-10-CM | POA: Diagnosis not present

## 2019-02-27 DIAGNOSIS — Z23 Encounter for immunization: Secondary | ICD-10-CM

## 2019-02-27 DIAGNOSIS — I483 Typical atrial flutter: Secondary | ICD-10-CM | POA: Diagnosis present

## 2019-02-27 DIAGNOSIS — Y831 Surgical operation with implant of artificial internal device as the cause of abnormal reaction of the patient, or of later complication, without mention of misadventure at the time of the procedure: Secondary | ICD-10-CM | POA: Diagnosis present

## 2019-02-27 DIAGNOSIS — Z792 Long term (current) use of antibiotics: Secondary | ICD-10-CM

## 2019-02-27 DIAGNOSIS — Z833 Family history of diabetes mellitus: Secondary | ICD-10-CM

## 2019-02-27 DIAGNOSIS — G4733 Obstructive sleep apnea (adult) (pediatric): Secondary | ICD-10-CM | POA: Diagnosis not present

## 2019-02-27 DIAGNOSIS — K219 Gastro-esophageal reflux disease without esophagitis: Secondary | ICD-10-CM | POA: Diagnosis present

## 2019-02-27 DIAGNOSIS — Z7984 Long term (current) use of oral hypoglycemic drugs: Secondary | ICD-10-CM

## 2019-02-27 DIAGNOSIS — N390 Urinary tract infection, site not specified: Secondary | ICD-10-CM | POA: Diagnosis present

## 2019-02-27 DIAGNOSIS — Z66 Do not resuscitate: Secondary | ICD-10-CM | POA: Diagnosis present

## 2019-02-27 DIAGNOSIS — Z79899 Other long term (current) drug therapy: Secondary | ICD-10-CM

## 2019-02-27 DIAGNOSIS — Z85828 Personal history of other malignant neoplasm of skin: Secondary | ICD-10-CM

## 2019-02-27 HISTORY — DX: Personal history of other medical treatment: Z92.89

## 2019-02-27 HISTORY — DX: Supraventricular tachycardia: I47.1

## 2019-02-27 HISTORY — DX: Supraventricular tachycardia, unspecified: I47.10

## 2019-02-27 HISTORY — DX: Atherosclerotic heart disease of native coronary artery without angina pectoris: I25.10

## 2019-02-27 HISTORY — DX: Paroxysmal atrial fibrillation: I48.0

## 2019-02-27 LAB — CBC
HCT: 39.6 % (ref 39.0–52.0)
Hemoglobin: 12.6 g/dL — ABNORMAL LOW (ref 13.0–17.0)
MCH: 29.3 pg (ref 26.0–34.0)
MCHC: 31.8 g/dL (ref 30.0–36.0)
MCV: 92.1 fL (ref 80.0–100.0)
Platelets: 367 10*3/uL (ref 150–400)
RBC: 4.3 MIL/uL (ref 4.22–5.81)
RDW: 13.2 % (ref 11.5–15.5)
WBC: 17 10*3/uL — ABNORMAL HIGH (ref 4.0–10.5)
nRBC: 0 % (ref 0.0–0.2)

## 2019-02-27 LAB — BASIC METABOLIC PANEL
Anion gap: 13 (ref 5–15)
BUN: 14 mg/dL (ref 8–23)
CO2: 20 mmol/L — ABNORMAL LOW (ref 22–32)
Calcium: 8.7 mg/dL — ABNORMAL LOW (ref 8.9–10.3)
Chloride: 100 mmol/L (ref 98–111)
Creatinine, Ser: 0.69 mg/dL (ref 0.61–1.24)
GFR calc Af Amer: >60 mL/min (ref >60)
GFR calc non Af Amer: >60 mL/min (ref >60)
Glucose, Bld: 131 mg/dL — ABNORMAL HIGH (ref 70–99)
Potassium: 4.6 mmol/L (ref 3.5–5.1)
Sodium: 133 mmol/L — ABNORMAL LOW (ref 135–145)

## 2019-02-27 LAB — TROPONIN I (HIGH SENSITIVITY)
Troponin I (High Sensitivity): 14 ng/L (ref <18)
Troponin I (High Sensitivity): 12 ng/L (ref <18)

## 2019-02-27 MED ORDER — ONDANSETRON HCL 4 MG/2ML IJ SOLN
4.0000 mg | Freq: Four times a day (QID) | INTRAMUSCULAR | Status: DC | PRN
  Administered 2019-02-28: 4 mg via INTRAVENOUS
  Filled 2019-02-27: qty 2

## 2019-02-27 MED ORDER — LORATADINE 10 MG PO TABS
10.0000 mg | ORAL_TABLET | Freq: Every day | ORAL | Status: DC
  Administered 2019-02-28 – 2019-03-04 (×5): 10 mg via ORAL
  Filled 2019-02-27 (×5): qty 1

## 2019-02-27 MED ORDER — SODIUM CHLORIDE 0.9% FLUSH
3.0000 mL | Freq: Once | INTRAVENOUS | Status: AC
  Administered 2019-02-27: 3 mL via INTRAVENOUS

## 2019-02-27 MED ORDER — APIXABAN 5 MG PO TABS
5.0000 mg | ORAL_TABLET | Freq: Two times a day (BID) | ORAL | Status: DC
  Administered 2019-02-27 – 2019-03-04 (×10): 5 mg via ORAL
  Filled 2019-02-27 (×10): qty 1

## 2019-02-27 MED ORDER — INSULIN ASPART 100 UNIT/ML ~~LOC~~ SOLN
0.0000 [IU] | Freq: Three times a day (TID) | SUBCUTANEOUS | Status: DC
  Administered 2019-03-03: 3 [IU] via SUBCUTANEOUS

## 2019-02-27 MED ORDER — SODIUM CHLORIDE 0.9 % IV SOLN
500.0000 mg | Freq: Every day | INTRAVENOUS | Status: DC
  Administered 2019-02-27 – 2019-02-28 (×2): 500 mg via INTRAVENOUS
  Filled 2019-02-27 (×6): qty 10

## 2019-02-27 MED ORDER — DILTIAZEM HCL 25 MG/5ML IV SOLN
15.0000 mg | Freq: Once | INTRAVENOUS | Status: AC
  Administered 2019-02-27: 15 mg via INTRAVENOUS
  Filled 2019-02-27: qty 5

## 2019-02-27 MED ORDER — SODIUM CHLORIDE 0.9 % IV BOLUS
500.0000 mL | Freq: Once | INTRAVENOUS | Status: AC
  Administered 2019-02-27: 500 mL via INTRAVENOUS

## 2019-02-27 MED ORDER — DILTIAZEM LOAD VIA INFUSION
15.0000 mg | Freq: Once | INTRAVENOUS | Status: AC
  Administered 2019-02-27: 15 mg via INTRAVENOUS
  Filled 2019-02-27: qty 15

## 2019-02-27 MED ORDER — DILTIAZEM HCL-DEXTROSE 125-5 MG/125ML-% IV SOLN (PREMIX)
5.0000 mg/h | INTRAVENOUS | Status: DC
  Administered 2019-02-27: 5 mg/h via INTRAVENOUS
  Filled 2019-02-27 (×2): qty 125

## 2019-02-27 MED ORDER — TRAMADOL HCL 50 MG PO TABS: 50.0000 mg | ORAL_TABLET | Freq: Four times a day (QID) | ORAL | Status: DC | PRN

## 2019-02-27 MED ORDER — RISAQUAD PO CAPS
1.0000 | ORAL_CAPSULE | Freq: Every day | ORAL | Status: DC
  Administered 2019-02-28 – 2019-03-03 (×4): 1 via ORAL
  Filled 2019-02-27 (×4): qty 1

## 2019-02-27 MED ORDER — ROSUVASTATIN CALCIUM 5 MG PO TABS
10.0000 mg | ORAL_TABLET | Freq: Every day | ORAL | Status: DC
  Filled 2019-02-27: qty 2

## 2019-02-27 MED ORDER — TIZANIDINE HCL 2 MG PO TABS
2.0000 mg | ORAL_TABLET | Freq: Four times a day (QID) | ORAL | Status: DC | PRN
  Filled 2019-02-27: qty 1

## 2019-02-27 NOTE — ED Triage Notes (Signed)
Pt to triage via [facility] from home.  C/o afib x 3 days.  History of ablation 2 years ago.  C/o SOB and chest tightness that was relieved after ASA 324mg  and NTG x 2.
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
## 2019-02-27 NOTE — ED Notes (Signed)
An unsuccessful attempt to call report to 6e rn will call back
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
## 2019-02-27 NOTE — Progress Notes (Signed)
Pharmacy Antibiotic Note  Eric Santos is a 73 y.o. male admitted on 02/27/2019 with septic knee.  Pharmacy has been consulted for cubicin dosing. He was receiving cubicin 500 mg daily as outpt.  Plan: Cont cubicin 500 mg IV q24 hrs. Check CK in am     Temp (24hrs), Avg:98.3 F (36.8 C), Min:98.3 F (36.8 C), Max:98.3 F (36.8 C)  Recent Labs  Lab 02/27/19 1712  WBC 17.0*  CREATININE 0.69    CrCl cannot be calculated (Unknown ideal weight.).    Allergies  Allergen Reactions  . Adhesive [Tape] Other (See Comments)    "Peels off skin"     Thank you for allowing pharmacy to be a part of this patient's care.  Beverlee Nims 02/27/2019 10:38 PM

## 2019-02-27 NOTE — Progress Notes (Signed)
Patient stated that he wears BIPAP at home to sleep, but will not be needing it tonight.

## 2019-02-27 NOTE — ED Notes (Signed)
Pt ambulatory around treatment room. HR went from 96 up to 140 when pt ambulating. Pt denies CP, SOB. States he feels fine. Pt in NAD. HR upon sitting back on the stretcher quickly went back down to 98. EDP made aware.

## 2019-02-28 ENCOUNTER — Encounter (HOSPITAL_COMMUNITY): Payer: Self-pay | Admitting: Family Medicine

## 2019-02-28 DIAGNOSIS — Z85828 Personal history of other malignant neoplasm of skin: Secondary | ICD-10-CM | POA: Diagnosis not present

## 2019-02-28 DIAGNOSIS — M00061 Staphylococcal arthritis, right knee: Secondary | ICD-10-CM | POA: Diagnosis not present

## 2019-02-28 DIAGNOSIS — Z20828 Contact with and (suspected) exposure to other viral communicable diseases: Secondary | ICD-10-CM | POA: Diagnosis present

## 2019-02-28 DIAGNOSIS — R5381 Other malaise: Secondary | ICD-10-CM | POA: Diagnosis present

## 2019-02-28 DIAGNOSIS — I4891 Unspecified atrial fibrillation: Secondary | ICD-10-CM | POA: Diagnosis present

## 2019-02-28 DIAGNOSIS — Z66 Do not resuscitate: Secondary | ICD-10-CM | POA: Diagnosis present

## 2019-02-28 DIAGNOSIS — B9561 Methicillin susceptible Staphylococcus aureus infection as the cause of diseases classified elsewhere: Secondary | ICD-10-CM | POA: Diagnosis not present

## 2019-02-28 DIAGNOSIS — I4819 Other persistent atrial fibrillation: Principal | ICD-10-CM

## 2019-02-28 DIAGNOSIS — E1169 Type 2 diabetes mellitus with other specified complication: Secondary | ICD-10-CM | POA: Diagnosis not present

## 2019-02-28 DIAGNOSIS — E785 Hyperlipidemia, unspecified: Secondary | ICD-10-CM | POA: Diagnosis present

## 2019-02-28 DIAGNOSIS — Z79899 Other long term (current) drug therapy: Secondary | ICD-10-CM | POA: Diagnosis not present

## 2019-02-28 DIAGNOSIS — Z7984 Long term (current) use of oral hypoglycemic drugs: Secondary | ICD-10-CM | POA: Diagnosis not present

## 2019-02-28 DIAGNOSIS — I483 Typical atrial flutter: Secondary | ICD-10-CM | POA: Diagnosis present

## 2019-02-28 DIAGNOSIS — Z23 Encounter for immunization: Secondary | ICD-10-CM | POA: Diagnosis present

## 2019-02-28 DIAGNOSIS — N390 Urinary tract infection, site not specified: Secondary | ICD-10-CM | POA: Diagnosis present

## 2019-02-28 DIAGNOSIS — B9689 Other specified bacterial agents as the cause of diseases classified elsewhere: Secondary | ICD-10-CM | POA: Diagnosis present

## 2019-02-28 DIAGNOSIS — T8453XA Infection and inflammatory reaction due to internal right knee prosthesis, initial encounter: Secondary | ICD-10-CM | POA: Diagnosis present

## 2019-02-28 DIAGNOSIS — Z792 Long term (current) use of antibiotics: Secondary | ICD-10-CM | POA: Diagnosis not present

## 2019-02-28 DIAGNOSIS — T8453XD Infection and inflammatory reaction due to internal right knee prosthesis, subsequent encounter: Secondary | ICD-10-CM | POA: Diagnosis not present

## 2019-02-28 DIAGNOSIS — G4733 Obstructive sleep apnea (adult) (pediatric): Secondary | ICD-10-CM

## 2019-02-28 DIAGNOSIS — Y831 Surgical operation with implant of artificial internal device as the cause of abnormal reaction of the patient, or of later complication, without mention of misadventure at the time of the procedure: Secondary | ICD-10-CM | POA: Diagnosis present

## 2019-02-28 DIAGNOSIS — K219 Gastro-esophageal reflux disease without esophagitis: Secondary | ICD-10-CM | POA: Diagnosis present

## 2019-02-28 DIAGNOSIS — Z7901 Long term (current) use of anticoagulants: Secondary | ICD-10-CM | POA: Diagnosis not present

## 2019-02-28 DIAGNOSIS — Z6839 Body mass index (BMI) 39.0-39.9, adult: Secondary | ICD-10-CM | POA: Diagnosis not present

## 2019-02-28 DIAGNOSIS — I2510 Atherosclerotic heart disease of native coronary artery without angina pectoris: Secondary | ICD-10-CM | POA: Diagnosis present

## 2019-02-28 DIAGNOSIS — E1165 Type 2 diabetes mellitus with hyperglycemia: Secondary | ICD-10-CM | POA: Diagnosis present

## 2019-02-28 DIAGNOSIS — Z1624 Resistance to multiple antibiotics: Secondary | ICD-10-CM | POA: Diagnosis not present

## 2019-02-28 DIAGNOSIS — Z833 Family history of diabetes mellitus: Secondary | ICD-10-CM | POA: Diagnosis not present

## 2019-02-28 DIAGNOSIS — R739 Hyperglycemia, unspecified: Secondary | ICD-10-CM | POA: Diagnosis not present

## 2019-02-28 LAB — CBC
HCT: 37.1 % — ABNORMAL LOW (ref 39.0–52.0)
Hemoglobin: 12 g/dL — ABNORMAL LOW (ref 13.0–17.0)
MCH: 28.7 pg (ref 26.0–34.0)
MCHC: 32.3 g/dL (ref 30.0–36.0)
MCV: 88.8 fL (ref 80.0–100.0)
Platelets: 390 10*3/uL (ref 150–400)
RBC: 4.18 MIL/uL — ABNORMAL LOW (ref 4.22–5.81)
RDW: 13.2 % (ref 11.5–15.5)
WBC: 13.6 10*3/uL — ABNORMAL HIGH (ref 4.0–10.5)
nRBC: 0 % (ref 0.0–0.2)

## 2019-02-28 LAB — BASIC METABOLIC PANEL
Anion gap: 11 (ref 5–15)
BUN: 9 mg/dL (ref 8–23)
CO2: 22 mmol/L (ref 22–32)
Calcium: 8.6 mg/dL — ABNORMAL LOW (ref 8.9–10.3)
Chloride: 103 mmol/L (ref 98–111)
Creatinine, Ser: 0.6 mg/dL — ABNORMAL LOW (ref 0.61–1.24)
GFR calc Af Amer: >60 mL/min (ref >60)
GFR calc non Af Amer: >60 mL/min (ref >60)
Glucose, Bld: 126 mg/dL — ABNORMAL HIGH (ref 70–99)
Potassium: 4 mmol/L (ref 3.5–5.1)
Sodium: 136 mmol/L (ref 135–145)

## 2019-02-28 LAB — URINALYSIS, ROUTINE W REFLEX MICROSCOPIC
Bilirubin Urine: NEGATIVE
Glucose, UA: NEGATIVE mg/dL
Ketones, ur: NEGATIVE mg/dL
Nitrite: POSITIVE — AB
Protein, ur: 30 mg/dL — AB
Specific Gravity, Urine: 1.014 (ref 1.005–1.030)
WBC, UA: >50 WBC/hpf — ABNORMAL HIGH (ref 0–5)
pH: 6 (ref 5.0–8.0)

## 2019-02-28 LAB — HEMOGLOBIN A1C
Hgb A1c MFr Bld: 6.3 % — ABNORMAL HIGH (ref 4.8–5.6)
Mean Plasma Glucose: 134.11 mg/dL

## 2019-02-28 LAB — GLUCOSE, CAPILLARY
Glucose-Capillary: 116 mg/dL — ABNORMAL HIGH (ref 70–99)
Glucose-Capillary: 113 mg/dL — ABNORMAL HIGH (ref 70–99)
Glucose-Capillary: 113 mg/dL — ABNORMAL HIGH (ref 70–99)
Glucose-Capillary: 100 mg/dL — ABNORMAL HIGH (ref 70–99)

## 2019-02-28 LAB — SARS CORONAVIRUS 2 (TAT 6-24 HRS): SARS Coronavirus 2: NEGATIVE

## 2019-02-28 LAB — TSH: TSH: 1.628 u[IU]/mL (ref 0.350–4.500)

## 2019-02-28 LAB — CK: Total CK: 38 U/L — ABNORMAL LOW (ref 49–397)

## 2019-02-28 MED ORDER — CHLORHEXIDINE GLUCONATE CLOTH 2 % EX PADS
6.0000 | MEDICATED_PAD | Freq: Every day | CUTANEOUS | Status: DC
  Administered 2019-03-01 – 2019-03-03 (×3): 6 via TOPICAL

## 2019-02-28 MED ORDER — METOPROLOL TARTRATE 50 MG PO TABS
50.0000 mg | ORAL_TABLET | Freq: Two times a day (BID) | ORAL | Status: DC
  Administered 2019-02-28: 50 mg via ORAL
  Filled 2019-02-28: qty 1

## 2019-02-28 MED ORDER — INFLUENZA VAC A&B SA ADJ QUAD 0.5 ML IM PRSY
0.5000 mL | PREFILLED_SYRINGE | Freq: Once | INTRAMUSCULAR | Status: AC
  Administered 2019-03-03: 0.5 mL via INTRAMUSCULAR
  Filled 2019-02-28 (×2): qty 0.5

## 2019-02-28 MED ORDER — METOPROLOL TARTRATE 50 MG PO TABS
50.0000 mg | ORAL_TABLET | Freq: Three times a day (TID) | ORAL | Status: DC
  Administered 2019-02-28 – 2019-03-04 (×11): 50 mg via ORAL
  Filled 2019-02-28 (×11): qty 1
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
## 2019-02-28 NOTE — ED Notes (Signed)
The pt has spoken tp his family several times

## 2019-02-28 NOTE — Plan of Care (Signed)
  Problem: Clinical Measurements: Goal: Ability to maintain clinical measurements within normal limits will improve Outcome: Progressing Goal: Cardiovascular complication will be avoided Outcome: Progressing   

## 2019-02-28 NOTE — ED Notes (Signed)
Report given to rn on 6e 

## 2019-02-28 NOTE — Progress Notes (Signed)
Per pt's wife, ID told him not to take Crestor while on Daptomycin. Ignacia Bayley, PA notified. Med stopped.

## 2019-02-28 NOTE — Progress Notes (Signed)
Pt declined the use of BIPAP while here in the hospital. Pt states he would not be able to sleep with all the noise. RT expressed to pt that if he changes his mind to please have RN call. RT will continue to monitor.

## 2019-02-28 NOTE — Progress Notes (Signed)
Pt had small formed stool. Lab unable to process for c dif. Dr Marthenia Rolling notified. Enteric precautions d/c'd.

## 2019-02-28 NOTE — Progress Notes (Addendum)
PROGRESS NOTE    Eric Santos  [id number] DOB: [birth year] DOA: 02/27/2019 PCP: Biagio Borg, MD  Outpatient Specialists:   Brief Narrative:  Patient is a 73 year old Caucasian male, morbidly obese, with past medical history significant for Paroxysmal atrial fibrillation, SVT, typical atrial flutter status post ablation in 2017, OSA on bipap, nonobstructive coronary artery disease and Type 2 diabetes.  Patient was admitted with nausea, vomiting, diarrhea, atrial fibrillation with rapid ventricular response and chest pressures.  GI symptoms are resolved significantly.  Heart rate has improved to the 80s.  Chest pressure has resolved.  Cardiology and PT is appreciated, for repeat echocardiogram.  No new symptoms reported.  Patient is eager to be fed.   Assessment & Plan:   Principal Problem:   Atrial fibrillation with RVR (HCC) Active Problems:   Obstructive sleep apnea   Infection of prosthetic right knee joint (HCC)   Type 2 diabetes mellitus with hyperlipidemia (HCC)  Paroxysmal atrial fibrillation with RVR Initially had plans for cardioversion with cardiology but this was postponed due to recent prosthetic knee infection Continue diltiazem drip Hold metoprolol while diltiazem Continue Eliquis Consult cardiology for further recommendation 02/28/2019: Rapid ventricular response as resolved.  Heart rate is currently 80 bpm.  Diltiazem is being weaned off.  Patient is currently on high-dose metoprolol.  Cardiology input is appreciated.  Echocardiogram will be repeated.  GI symptoms/diarrhea/?intermittent fever Afebrile here and on long term antibiotic. Could be GI intolerance from antibiotic.  obtain blood culture test for C.diff  If develops fever, would get ID consult 02/28/2019: Her GI symptoms have resolved.  History of right septic prosthetic knee Continue IV daptomycin from 11/21 to 03/10/2019  Type 2 diabetes BG of 135  Moderate SSI  Hyperlipidemia Continue  statin  OSA Continue Bipap   DVT prophylaxis: Eliquis Code Status: DNR Family Communication: Wife Disposition Plan: Home eventually   Consultants:   Cardiology  Procedures:   Echocardiogram is pending  Antimicrobials:   IV daptomycin   Subjective: No nausea or vomiting Diarrhea has resolved No chest pressure No palpitations No shortness of breath  Objective: Vitals:   02/28/19 0310 02/28/19 0410 02/28/19 0510 02/28/19 1127  BP: (!) 115/102 103/79 110/76 (!) 117/91  Pulse: (!) 122 (!) 101 95 (!) 104  Resp: 18 18    Temp: 98.5 F (36.9 C)     TempSrc: Oral     SpO2: 96% 93% 93% 94%  Weight: 111.2 kg     Height: 5\' 6"  (1.676 m)       Intake/Output Summary (Last 24 hours) at 02/28/2019 1419 Last data filed at 02/28/2019 1000 Gross per 24 hour  Intake 55.83 ml  Output 2430 ml  Net -2374.17 ml   Filed Weights   02/28/19 0310  Weight: 111.2 kg    Examination:  General exam: Appears calm and comfortable.  Patient is obese. Respiratory system: Clear to auscultation. Respiratory effort normal. Cardiovascular system: S1 & S2 heard Gastrointestinal system: Abdomen is morbidly obese, soft and nontender.  Organs are difficult to assess.   Central nervous system: Alert and oriented. No focal neurological deficits. Extremities: No leg edema  Data Reviewed: I have personally reviewed following labs and imaging studies  CBC: Recent Labs  Lab 02/27/19 1712 02/28/19 0207  WBC 17.0* 13.6*  HGB 12.6* 12.0*  HCT 39.6 37.1*  MCV 92.1 88.8  PLT 367 [id number]   Basic Metabolic Panel: Recent Labs  Lab 02/27/19 1712 02/28/19 0207  NA 133* 136  K 4.6 4.0  CL 100 103  CO2 20* 22  GLUCOSE 131* 126*  BUN 14 9  CREATININE 0.69 0.60*  CALCIUM 8.7* 8.6*   GFR: Estimated Creatinine Clearance: 97.8 mL/min (A) (by C-G formula based on SCr of 0.6 mg/dL (L)). Liver Function Tests: No results for input(s): AST, ALT, ALKPHOS, BILITOT, PROT, ALBUMIN in the last 168  hours. No results for input(s): LIPASE, AMYLASE in the last 168 hours. No results for input(s): AMMONIA in the last 168 hours. Coagulation Profile: No results for input(s): INR, PROTIME in the last 168 hours. Cardiac Enzymes: Recent Labs  Lab 02/28/19 0207  CKTOTAL 38*   BNP (last 3 results) No results for input(s): PROBNP in the last 8760 hours. HbA1C: Recent Labs    02/28/19 0207  HGBA1C 6.3*   CBG: Recent Labs  Lab 02/28/19 0806 02/28/19 1228  GLUCAP 116* 113*   Lipid Profile: No results for input(s): CHOL, HDL, LDLCALC, TRIG, CHOLHDL, LDLDIRECT in the last 72 hours. Thyroid Function Tests: Recent Labs    02/28/19 0207  TSH 1.628   Anemia Panel: No results for input(s): VITAMINB12, FOLATE, FERRITIN, TIBC, IRON, RETICCTPCT in the last 72 hours. Urine analysis:    Component Value Date/Time   COLORURINE YELLOW 07/20/2018 0739   APPEARANCEUR CLEAR 07/20/2018 0739   LABSPEC 1.025 07/20/2018 0739   PHURINE 6.0 07/20/2018 0739   GLUCOSEU NEGATIVE 07/20/2018 0739   HGBUR NEGATIVE 07/20/2018 0739   BILIRUBINUR NEGATIVE 07/20/2018 0739   KETONESUR NEGATIVE 07/20/2018 0739   PROTEINUR NEGATIVE 12/17/2011 1255   UROBILINOGEN 1.0 07/20/2018 0739   NITRITE NEGATIVE 07/20/2018 0739   LEUKOCYTESUR NEGATIVE 07/20/2018 0739   Sepsis Labs: @LABRCNTIP (procalcitonin:4,lacticidven:4)  ) Recent Results (from the past 240 hour(s))  SARS CORONAVIRUS 2 (TAT 6-24 HRS) Nasopharyngeal Nasopharyngeal Swab     Status: None   Collection Time: 02/27/19  9:10 PM   Specimen: Nasopharyngeal Swab  Result Value Ref Range Status   SARS Coronavirus 2 NEGATIVE NEGATIVE Final    Comment: (NOTE) SARS-CoV-2 target nucleic acids are NOT DETECTED. The SARS-CoV-2 RNA is generally detectable in upper and lower respiratory specimens during the acute phase of infection. Negative results do not preclude SARS-CoV-2 infection, do not rule out co-infections with other pathogens, and should not be  used as the sole basis for treatment or other patient management decisions. Negative results must be combined with clinical observations, patient history, and epidemiological information. The expected result is Negative. Fact Sheet for Patients: SugarRoll.be Fact Sheet for Healthcare Providers: https://www.woods-mathews.com/ This test is not yet approved or cleared by the [place] FDA and  has been authorized for detection and/or diagnosis of SARS-CoV-2 by FDA under an Emergency Use Authorization (EUA). This EUA will remain  in effect (meaning this test can be used) for the duration of the COVID-19 declaration under Section 56 4(b)(1) of the Act, 21 U.S.C. section 360bbb-3(b)(1), unless the authorization is terminated or revoked sooner. Performed at [facility], [place] [address], [place], [place] [address]          Radiology Studies: DG Chest Portable 1 View  Result Date: 02/27/2019 CLINICAL DATA:  Chest tightness and shortness of breath. EXAM: PORTABLE CHEST 1 VIEW COMPARISON:  01/24/2019 FINDINGS: 1740 hours. The cardio pericardial silhouette is enlarged. Right PICC line tip projects over the mid SVC level. There is pulmonary vascular congestion without overt pulmonary edema. No focal airspace consolidation or pleural effusion. The visualized bony structures of the thorax are intact. Telemetry leads overlie the chest. IMPRESSION: Cardiomegaly with  vascular congestion. No acute cardiopulmonary findings. Electronically Signed   By: Misty Stanley M.D.   On: 02/27/2019 17:53        Scheduled Meds: . acidophilus  1 capsule Oral QHS  . apixaban  5 mg Oral BID  . Chlorhexidine Gluconate Cloth  6 each Topical Daily  . influenza vaccine adjuvanted  0.5 mL Intramuscular Once  . insulin aspart  0-15 Units Subcutaneous TID WC  . loratadine  10 mg Oral Daily  . metoprolol tartrate  50 mg Oral TID   Continuous Infusions: . DAPTOmycin  (CUBICIN)  IV Stopped (02/28/19 0004)     LOS: 0 days    Time spent: 35 minutes    Dana Allan, MD  [facility] Pager #: [phone] 7PM-7AM contact night coverage as above

## 2019-02-28 NOTE — ED Notes (Signed)
6e does not want to take this pt unless his covid is negative  The pts covid test has not resulted

## 2019-03-01 ENCOUNTER — Inpatient Hospital Stay (HOSPITAL_COMMUNITY): Payer: Medicare Other

## 2019-03-01 DIAGNOSIS — I4891 Unspecified atrial fibrillation: Secondary | ICD-10-CM

## 2019-03-01 LAB — GLUCOSE, CAPILLARY
Glucose-Capillary: 99 mg/dL (ref 70–99)
Glucose-Capillary: 114 mg/dL — ABNORMAL HIGH (ref 70–99)
Glucose-Capillary: 95 mg/dL (ref 70–99)
Glucose-Capillary: 159 mg/dL — ABNORMAL HIGH (ref 70–99)

## 2019-03-01 LAB — CBC
HCT: 39.3 % (ref 39.0–52.0)
Hemoglobin: 12.5 g/dL — ABNORMAL LOW (ref 13.0–17.0)
MCH: 29.2 pg (ref 26.0–34.0)
MCHC: 31.8 g/dL (ref 30.0–36.0)
MCV: 91.8 fL (ref 80.0–100.0)
Platelets: 470 10*3/uL — ABNORMAL HIGH (ref 150–400)
RBC: 4.28 MIL/uL (ref 4.22–5.81)
RDW: 13.4 % (ref 11.5–15.5)
WBC: 15.1 10*3/uL — ABNORMAL HIGH (ref 4.0–10.5)
nRBC: 0 % (ref 0.0–0.2)

## 2019-03-01 LAB — BASIC METABOLIC PANEL
Anion gap: 12 (ref 5–15)
BUN: 12 mg/dL (ref 8–23)
CO2: 25 mmol/L (ref 22–32)
Calcium: 8.8 mg/dL — ABNORMAL LOW (ref 8.9–10.3)
Chloride: 97 mmol/L — ABNORMAL LOW (ref 98–111)
Creatinine, Ser: 0.73 mg/dL (ref 0.61–1.24)
GFR calc Af Amer: >60 mL/min (ref >60)
GFR calc non Af Amer: >60 mL/min (ref >60)
Glucose, Bld: 102 mg/dL — ABNORMAL HIGH (ref 70–99)
Potassium: 4.4 mmol/L (ref 3.5–5.1)
Sodium: 134 mmol/L — ABNORMAL LOW (ref 135–145)

## 2019-03-01 LAB — ECHOCARDIOGRAM COMPLETE
Height: 66 in
Weight: 3889.6 oz

## 2019-03-01 MED ORDER — SODIUM CHLORIDE 0.9% FLUSH: 10.0000 mL | INTRAVENOUS | Status: DC | PRN

## 2019-03-01 MED ORDER — SODIUM CHLORIDE 0.9 % IV SOLN
1.0000 g | INTRAVENOUS | Status: DC
  Administered 2019-03-01 – 2019-03-03 (×3): 1 g via INTRAVENOUS
  Filled 2019-03-01: qty 10
  Filled 2019-03-01 (×2): qty 1

## 2019-03-01 MED ORDER — DILTIAZEM LOAD VIA INFUSION
10.0000 mg | Freq: Once | INTRAVENOUS | Status: DC
  Filled 2019-03-01: qty 10

## 2019-03-01 MED ORDER — DILTIAZEM HCL 60 MG PO TABS
60.0000 mg | ORAL_TABLET | Freq: Four times a day (QID) | ORAL | Status: DC
  Administered 2019-03-01 – 2019-03-04 (×13): 60 mg via ORAL
  Filled 2019-03-01 (×13): qty 1

## 2019-03-01 MED ORDER — DILTIAZEM HCL-DEXTROSE 125-5 MG/125ML-% IV SOLN (PREMIX)
5.0000 mg/h | INTRAVENOUS | Status: DC
  Administered 2019-03-01: 5 mg/h via INTRAVENOUS
  Filled 2019-03-01: qty 125

## 2019-03-01 NOTE — Evaluation (Signed)
Physical Therapy Evaluation Patient Details Name: Eric Santos MRN: [id number] DOB: 06-08-1945 Today's Date: 03/01/2019   History of Present Illness  73yo male c/o nausea/vomiting, diarrhea, A-fib, chest pressure. 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 Covid negative at admit. He was ultimately admitted due to A-fib with RVR. PMH A-fib, DM, HLD, TKR R with multiple revisions and I&Ds, cardioversion  Clinical Impression   Patient received up in chair, pleasant and cooperative; spouse present and very supportive, reports their son can also provide support PRN around the home. Able to complete functional transfers with S/no device, multiple short bouts of gait in room totaling approximately 68f with min guard/no device, limited by HR elevation to 130-155BPM with activity but vitals otherwise stable on room air. HR recovered down to high 90s/low 100s with seated rest. He was left up in the chair with all needs met, spouse present this morning. He will continue to benefit from skilled PT services in the acute setting, currently do not need he will need skilled PT follow-up after DC.     Follow Up Recommendations No PT follow up    Equipment Recommendations  None recommended by PT(well equipped)    Recommendations for Other Services       Precautions / Restrictions Precautions Precautions: Fall;Other (comment) Precaution Comments: watch HR Restrictions Weight Bearing Restrictions: No      Mobility  Bed Mobility               General bed mobility comments: OOB in chair upon arrival  Transfers Overall transfer level: Needs assistance Equipment used: None Transfers: Sit to/from Stand Sit to Stand: Supervision         General transfer comment: S for safety and line management  Ambulation/Gait Ambulation/Gait assistance: Min guard Gait Distance (Feet): 40 Feet Assistive  device: None Gait Pattern/deviations: Step-through pattern;Decreased step length - left;Decreased stance time - right;Decreased weight shift to right Gait velocity: decreased   General Gait Details: multiple laps of short distance in room due to elevated HR (low 100s at rest, increased to 130-155BPM with activity and recovered back to high 90s/low 100s with return to sitting)  Stairs            Wheelchair Mobility    Modified Rankin (Stroke Patients Only)       Balance Overall balance assessment: Mild deficits observed, not formally tested                                           Pertinent Vitals/Pain Pain Assessment: No/denies pain    Home Living Family/patient expects to be discharged to:: Private residence Living Arrangements: Spouse/significant other Available Help at Discharge: Family;Available 24 hours/day Type of Home: House Home Access: Stairs to enter;Ramped entrance Entrance Stairs-Rails: None Entrance Stairs-Number of Steps: 1 step Home Layout: One level Home Equipment: Wheelchair - power;Grab bars - toilet;Shower seat - built in;Walker - 2 wheels;Cane - single point Additional Comments: 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    Prior Function Level of Independence: Independent         Comments: 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     Hand Dominance   Dominant Hand: Right    Extremity/Trunk Assessment  Upper Extremity Assessment Upper Extremity Assessment: Overall WFL for tasks assessed    Lower Extremity Assessment Lower Extremity Assessment: Overall WFL for tasks assessed    Cervical / Trunk Assessment Cervical / Trunk Assessment: Normal  Communication   Communication: No difficulties  Cognition Arousal/Alertness: Awake/alert Behavior During Therapy: WFL for tasks assessed/performed Overall Cognitive Status: Within Functional Limits for  tasks assessed                                 General Comments: A&Ox4, very pleasant and cooperative      General Comments General comments (skin integrity, edema, etc.): continued to have A-fib with  RVR during session    Exercises     Assessment/Plan    PT Assessment Patient needs continued PT services  PT Problem List Obesity;Decreased activity tolerance;Decreased safety awareness;Decreased balance;Decreased mobility;Cardiopulmonary status limiting activity;Decreased coordination       PT Treatment Interventions DME instruction;Balance training;Gait training;Neuromuscular re-education;Stair training;Functional mobility training;Patient/family education;Therapeutic activities;Therapeutic exercise    PT Goals (Current goals can be found in the Care Plan section)  Acute Rehab PT Goals Patient Stated Goal: go home soon PT Goal Formulation: With patient/family Time For Goal Achievement: 03/15/19 Potential to Achieve Goals: Good    Frequency Min 3X/week   Barriers to discharge        Co-evaluation               AM-PAC PT "6 Clicks" Mobility  Outcome Measure Help needed turning from your back to your side while in a flat bed without using bedrails?: A Little Help needed moving from lying on your back to sitting on the side of a flat bed without using bedrails?: A Little Help needed moving to and from a bed to a chair (including a wheelchair)?: A Little Help needed standing up from a chair using your arms (e.g., wheelchair or bedside chair)?: A Little Help needed to walk in hospital room?: A Little Help needed climbing 3-5 steps with a railing? : A Little 6 Click Score: 18    End of Session   Activity Tolerance: Patient tolerated treatment well Patient left: in chair;with call bell/phone within reach;with family/visitor present   PT Visit Diagnosis: Difficulty in walking, not elsewhere classified (R26.2)    Time: 1464-3142 PT Time Calculation  (min) (ACUTE ONLY): 16 min   Charges:   PT Evaluation $PT Eval Moderate Complexity: 1 Mod          Windell Norfolk, DPT, PN1   Supplemental Physical Therapist [place]    Pager [phone] Acute Rehab Office [phone]

## 2019-03-01 NOTE — Progress Notes (Signed)
Echocardiogram 2D Echocardiogram has been performed.  Oneal Deputy Loel Betancur 03/01/2019, 9:10 AM

## 2019-03-01 NOTE — Progress Notes (Signed)
Pt HR is back to Afib RVR.  Cardiology team made aware.  Administered his 1000 metoprolol.  Idolina Primer, RN

## 2019-03-01 NOTE — Progress Notes (Addendum)
PROGRESS NOTE  Eric Santos [id number] DOB: September 17, 1945 DOA: 02/27/2019 PCP: Biagio Borg, MD  HPI/Recap of past 24 hours: Patient is a 73 year old Caucasian male, morbidly obese, with past medical history significant for Paroxysmal atrial fibrillation, SVT, typical atrial flutter status post ablation in 2017, OSA on bipap, nonobstructive coronary artery disease and Type 2 diabetes.  Patient was admitted with nausea, vomiting, diarrhea, atrial fibrillation with rapid ventricular response and chest pressures.  GI symptoms are resolved significantly.  Heart rate has improved to the 80s.  Chest pressure has resolved.  TTE pending, in the setting of recent R prosthetic knee septic arthritis. R knee fluid 01/20/19 grew multidrug-resistant staph epidermis.  03/01/19: Seen and examined.  Reports chest tightness overnight lasting about 30 minutes and resolving spontaneously.  Denies dyspnea at rest or palpitations despite A. fib RVR on the monitor in the room.   Assessment/Plan: Principal Problem:   Atrial fibrillation with RVR (HCC) Active Problems:   Obstructive sleep apnea   Infection of prosthetic right knee joint (HCC)   Type 2 diabetes mellitus with hyperlipidemia (HCC)  Paroxysmal atrial fibrillation with RVR Initially had plans for cardioversion with cardiology but this was postponed due to recent prosthetic knee infection, on IV antibiotics and TTE is pending. Off diltiazem drip Currently on Lopressor p.o. 50 mg 3 times daily. On Eliquis for CVA prevention Cardiology following, appreciate assistance. 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 2D echo has been ordered and pending to rule out cardiac source. WBC is trending down Afebrile. Continue to monitor fever curve and WBC  Presumed UTI UA done last night +  pyuria Urine cx in process Start rocephin empirically Follow cultures, ID and sensitivities to narrow abx  GI symptoms/diarrhea/?intermittent fever Afebrile here and on long term antibiotic. Could be GI intolerance from antibiotic. Cultures in process GI symptoms improving.  Type 2 diabetes, well controlled Hemoglobin A1c 6.3 on 02/28/2019 Continue insulin sliding scale Avoid hypoglycemia  Hyperlipidemia Continue statin  OSA Continue Bipapnightly  Physical debility PT OT to assess Full precautions    DVT prophylaxis: Eliquis Code Status: DNR Family Communication:  None at bedside.  Disposition Plan: Home once cardiology signs off.   Consultants:   Cardiology  Procedures:   Echocardiogram is pending  Antimicrobials:   IV daptomycin     Objective: Vitals:   02/28/19 1531 02/28/19 2103 03/01/19 0526 03/01/19 0751  BP: 109/84 102/79 101/77 113/72  Pulse: 92 (!) 102 95 (!) 137  Resp: 18     Temp: 98.1 F (36.7 C) 97.9 F (36.6 C) 98.3 F (36.8 C)   TempSrc: Oral Oral Oral   SpO2: 98% 95% 95%   Weight:   110.3 kg   Height:        Intake/Output Summary (Last 24 hours) at 03/01/2019 0854 Last data filed at 02/28/2019 2122 Gross per 24 hour  Intake 1056.38 ml  Output 650 ml  Net 406.38 ml   Filed Weights   02/28/19 0310 03/01/19 0526  Weight: 111.2 kg 110.3 kg    Exam:  . General: 73 y.o. year-old male well developed well nourished in no acute distress.  Alert and oriented x3. . Cardiovascular: Irregular rate and rhythm with no rubs or gallops.  No thyromegaly or JVD noted.  Mar[place] Kitchen Respiratory: Clear to auscultation with no wheezes or rales. Good inspiratory effort. . Abdomen: Soft nontender nondistended with  normal bowel sounds x4 quadrants. . Musculoskeletal: Trace lower extremity edema.  Right knee mildly edematous but nontender on palpation with no erythema. Mar[place] Kitchen Psychiatry: Mood is appropriate for condition and setting   Data  Reviewed: CBC: Recent Labs  Lab 02/27/19 1712 02/28/19 0207 03/01/19 0620  WBC 17.0* 13.6* 15.1*  HGB 12.6* 12.0* 12.5*  HCT 39.6 37.1* 39.3  MCV 92.1 88.8 91.8  PLT 367 390 [id number]   Basic Metabolic Panel: Recent Labs  Lab 02/27/19 1712 02/28/19 0207 03/01/19 0620  NA 133* 136 134*  K 4.6 4.0 4.4  CL 100 103 97*  CO2 20* 22 25  GLUCOSE 131* 126* 102*  BUN 14 9 12   CREATININE 0.69 0.60* 0.73  CALCIUM 8.7* 8.6* 8.8*   GFR: Estimated Creatinine Clearance: 97.3 mL/min (by C-G formula based on SCr of 0.73 mg/dL). Liver Function Tests: No results for input(s): AST, ALT, ALKPHOS, BILITOT, PROT, ALBUMIN in the last 168 hours. No results for input(s): LIPASE, AMYLASE in the last 168 hours. No results for input(s): AMMONIA in the last 168 hours. Coagulation Profile: No results for input(s): INR, PROTIME in the last 168 hours. Cardiac Enzymes: Recent Labs  Lab 02/28/19 0207  CKTOTAL 38*   BNP (last 3 results) No results for input(s): PROBNP in the last 8760 hours. HbA1C: Recent Labs    02/28/19 0207  HGBA1C 6.3*   CBG: Recent Labs  Lab 02/28/19 0806 02/28/19 1228 02/28/19 1712 02/28/19 2101 03/01/19 0732  GLUCAP 116* 113* 113* 100* 99   Lipid Profile: No results for input(s): CHOL, HDL, LDLCALC, TRIG, CHOLHDL, LDLDIRECT in the last 72 hours. Thyroid Function Tests: Recent Labs    02/28/19 0207  TSH 1.628   Anemia Panel: No results for input(s): VITAMINB12, FOLATE, FERRITIN, TIBC, IRON, RETICCTPCT in the last 72 hours. Urine analysis:    Component Value Date/Time   COLORURINE YELLOW 02/28/2019 2113   APPEARANCEUR HAZY (A) 02/28/2019 2113   LABSPEC 1.014 02/28/2019 2113   PHURINE 6.0 02/28/2019 2113   GLUCOSEU NEGATIVE 02/28/2019 2113   GLUCOSEU NEGATIVE 07/20/2018 0739   HGBUR SMALL (A) 02/28/2019 2113   BILIRUBINUR NEGATIVE 02/28/2019 2113   [place] NEGATIVE 02/28/2019 2113   PROTEINUR 30 (A) 02/28/2019 2113   UROBILINOGEN 1.0 07/20/2018 0739    NITRITE POSITIVE (A) 02/28/2019 2113   LEUKOCYTESUR MODERATE (A) 02/28/2019 2113   Sepsis Labs: @LABRCNTIP (procalcitonin:4,lacticidven:4)  ) Recent Results (from the past 240 hour(s))  SARS CORONAVIRUS 2 (TAT 6-24 HRS) Nasopharyngeal Nasopharyngeal Swab     Status: None   Collection Time: 02/27/19  9:10 PM   Specimen: Nasopharyngeal Swab  Result Value Ref Range Status   SARS Coronavirus 2 NEGATIVE NEGATIVE Final    Comment: (NOTE) SARS-CoV-2 target nucleic acids are NOT DETECTED. The SARS-CoV-2 RNA is generally detectable in upper and lower respiratory specimens during the acute phase of infection. Negative results do not preclude SARS-CoV-2 infection, do not rule out co-infections with other pathogens, and should not be used as the sole basis for treatment or other patient management decisions. Negative results must be combined with clinical observations, patient history, and epidemiological information. The expected result is Negative. Fact Sheet for Patients: SugarRoll.be Fact Sheet for Healthcare Providers: https://www.woods-mathews.com/ This test is not yet approved or cleared by the [place] FDA and  has been authorized for detection and/or diagnosis of SARS-CoV-2 by FDA under an Emergency Use Authorization (EUA). This EUA will remain  in effect (meaning this test can be used) for the duration of the COVID-19  declaration under Section 56 4(b)(1) of the Act, 21 U.S.C. section 360bbb-3(b)(1), unless the authorization is terminated or revoked sooner. Performed at [facility], [place] [address], [place], [place] [address]       Studies: No results found.  Scheduled Meds: . acidophilus  1 capsule Oral QHS  . apixaban  5 mg Oral BID  . Chlorhexidine Gluconate Cloth  6 each Topical Daily  . diltiazem  10 mg Intravenous Once  . influenza vaccine adjuvanted  0.5 mL Intramuscular Once  . insulin aspart  0-15 Units  Subcutaneous TID WC  . loratadine  10 mg Oral Daily  . metoprolol tartrate  50 mg Oral TID    Continuous Infusions: . DAPTOmycin (CUBICIN)  IV 500 mg (02/28/19 2122)  . diltiazem (CARDIZEM) infusion       LOS: 1 day     Kayleen Memos, MD [facility] Pager [phone]  If 7PM-7AM, please contact night-coverage [email or web address] Password [facility] 03/01/2019, 8:54 AM

## 2019-03-01 NOTE — Progress Notes (Addendum)
Progress Note  Patient Name: Eric Santos Date of Encounter: 03/01/2019  Primary Cardiologist: [name], MD   Subjective   Patient is sitting up in his chair eating breakfast.  Feels better since he has been able to eat.  Heart rates are in the 140s-160s however he has no awareness.  No palpitations, shortness of breath or chest pressure.  He did have some mild chest pressure during his antibiotic infusion last night.  Inpatient Medications    Scheduled Meds:  acidophilus  1 capsule Oral QHS   apixaban  5 mg Oral BID   Chlorhexidine Gluconate Cloth  6 each Topical Daily   influenza vaccine adjuvanted  0.5 mL Intramuscular Once   insulin aspart  0-15 Units Subcutaneous TID WC   loratadine  10 mg Oral Daily   metoprolol tartrate  50 mg Oral TID   Continuous Infusions:  DAPTOmycin (CUBICIN)  IV 500 mg (02/28/19 2122)   PRN Meds: ondansetron (ZOFRAN) IV, tiZANidine, traMADol   Vital Signs    Vitals:   02/28/19 1127 02/28/19 1531 02/28/19 2103 03/01/19 0526  BP: (!) 117/91 109/84 102/79 101/77  Pulse: (!) 104 92 (!) 102 95  Resp:  18    Temp:  98.1 F (36.7 C) 97.9 F (36.6 C) 98.3 F (36.8 C)  TempSrc:  Oral Oral Oral  SpO2: 94% 98% 95% 95%  Weight:    110.3 kg  Height:        Intake/Output Summary (Last 24 hours) at 03/01/2019 0745 Last data filed at 02/28/2019 2122 Gross per 24 hour  Intake 1056.38 ml  Output 1000 ml  Net 56.38 ml   Last 3 Weights 03/01/2019 02/28/2019 01/26/2019  Weight (lbs) 243 lb 1.6 oz 245 lb 3.2 oz 263 lb  Weight (kg) 110.269 kg 111.222 kg 119.296 kg      Telemetry    Atrial fibrillation with rates in the 90s-100s overnight up to 130s-170s this morning- Personally Reviewed  ECG    No new tracings for review  Physical Exam   GEN: No acute distress.   Neck: No JVD Cardiac:  Irregularly irregular rhythm, tachycardia Respiratory: Clear to auscultation bilaterally. GI: Soft, nontender, non-distended  MS: No edema; No  deformity. Neuro:  Nonfocal  Psych: Normal affect   Labs    High Sensitivity Troponin:   Recent Labs  Lab 02/27/19 1712 02/27/19 1838  TROPONINIHS 14 12      Chemistry Recent Labs  Lab 02/27/19 1712 02/28/19 0207  NA 133* 136  K 4.6 4.0  CL 100 103  CO2 20* 22  GLUCOSE 131* 126*  BUN 14 9  CREATININE 0.69 0.60*  CALCIUM 8.7* 8.6*  GFRNONAA >60 >60  GFRAA >60 >60  ANIONGAP 13 11     Hematology Recent Labs  Lab 02/27/19 1712 02/28/19 0207  WBC 17.0* 13.6*  RBC 4.30 4.18*  HGB 12.6* 12.0*  HCT 39.6 37.1*  MCV 92.1 88.8  MCH 29.3 28.7  MCHC 31.8 32.3  RDW 13.2 13.2  PLT 367 390    BNPNo results for input(s): BNP, PROBNP in the last 168 hours.   DDimer No results for input(s): DDIMER in the last 168 hours.   Radiology    DG Chest Portable 1 View  Result Date: 02/27/2019 CLINICAL DATA:  Chest tightness and shortness of breath. EXAM: PORTABLE CHEST 1 VIEW COMPARISON:  01/24/2019 FINDINGS: 1740 hours. The cardio pericardial silhouette is enlarged. Right PICC line tip projects over the mid SVC level. There is pulmonary  vascular congestion without overt pulmonary edema. No focal airspace consolidation or pleural effusion. The visualized bony structures of the thorax are intact. Telemetry leads overlie the chest. IMPRESSION: Cardiomegaly with vascular congestion. No acute cardiopulmonary findings. Electronically Signed   By: Misty Stanley M.D.   On: 02/27/2019 17:53    Cardiac Studies   LEFT HEART CATH AND CORONARY ANGIOGRAPHY 12/30/2017  Conclusion   Mid Cx to Dist Cx lesion is 30% stenosed. Prox LAD lesion is 25% stenosed. The left ventricular systolic function is normal. LV end diastolic pressure is mildly elevated. The left ventricular ejection fraction is 55-65% by visual estimate.   1. 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 Normal LV systolic function with LVEF 55-65%    Echocardiogram 07/10/2017 Study Conclusion - Left  ventricle: The cavity size was normal. Wall thickness was   increased in a pattern of mild LVH. Systolic function was normal.   The estimated ejection fraction was in the range of 55% to 60%. - Aortic valve: There was mild regurgitation. - Right ventricle: The cavity size was mildly dilated. Wall   thickness was normal.   Impressions: - No significant change compared to previous study.  Patient Profile     73 y.o. male  with a history of PAF, typical atrial flutter s/p ablation in 2017, nonobs CAD, PSVT, OSA on CPAP, DMII, and HL, who is being seen for the evaluation and treatment of Afib w/ RVR.  Assessment & Plan    Persistent atrial fibrillation with rapid ventricular response/paroxysmal atrial flutter -Patient with history of atrial fibrillation and flutter status post flutter ablation in 2017.  Patient with recurrent atrial flutter in November of this year following hospitalization for left prosthetic knee infection.  Patient deferred TEE and cardioversion until after PICC line is removed from treating his infection, in January. -Patient with progressively worsening diarrhea while on IV antibiotics to treat the infection. -Electrolytes and TSH normal on presentation. -Rates improved with IV Cardizem yesterday.  Heart rates were in the 90s-low 100s overnight, however, rates up to the 130s-170s this morning with activity.  He is asymptomatic.  IV Cardizem was discontinued yesterday with plan for rate control with metoprolol.  Continue to plan for cardioversion once antibiotics are completed anyway.  It is felt that cardioversion at this time would not hold sinus rhythm in the setting of his current illness.   -Metoprolol 50 mg was increased from twice daily to 3 times daily.  Patient is receiving his morning dose now. -Patient continues on Eliquis for stroke risk reduction. -With heart rates in the 140s-160s, I will resume his Cardizem drip with Dr. Johnsie Cancel to make further  recommendations.  Chest pressure/nonobstructive CAD -Patient noted chest pressure in the setting of A. fib with RVR.  Patient had prolonged symptoms but troponins remained normal.  He was noted to have mild nonobstructive CAD by cath in 2019. -Continues on statin.  No aspirin in the setting of anticoagulation.  Diarrhea -In the setting of ongoing outpatient IV antibiotics for left prosthetic knee infection diagnosed in November.  His diarrhea was worsening over the last few weeks.  He was also recently started on Metformin however he does not think this is contributing as he was only taking it on an as-needed basis when his sugar was over 150. -Felt to be contributing to his poorly controlled atrial fibrillation rates. -Patient had a formed stool yesterday and has had no bowel movement since. -Notes indicate that stool was tested for C. difficile,  however I do not see any result.  Seems unlikely since his diarrhea has now stopped.  Right prosthetic knee infection -Diagnosed in November.  Patient was treated with outpatient IV antibiotics.  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  He has follow-up with infectious disease as an outpatient. -Checking echocardiogram since septic arthritis is uncertain.  Hyperglycemia -Recently placed on Metformin.  A1c 6.3.  Hyperlipidemia -LDL was 58 in May, well controlled.  Continue current statin.    For questions or updates, please contact [place] Please consult [email or web address] for contact info under        Signed, Daune Perch, NP  03/01/2019, 7:45 AM    Patient examined chart reviewed. Discussed care with patient and NP.  Lungs clear no murmur right knee with infection and multiple previus surgeries and TKR.  Telemetry with better rate control after morning meds Has been on Rx eliquis Has PIC line for antibiotics in RUE. No CAD and normal EF will change to PO  cardizem and continue beta blocker  [name] MD [facility]

## 2019-03-02 DIAGNOSIS — I4891 Unspecified atrial fibrillation: Secondary | ICD-10-CM

## 2019-03-02 LAB — URINE CULTURE: Culture: >=100000 — AB

## 2019-03-02 LAB — GLUCOSE, CAPILLARY
Glucose-Capillary: 115 mg/dL — ABNORMAL HIGH (ref 70–99)
Glucose-Capillary: 120 mg/dL — ABNORMAL HIGH (ref 70–99)
Glucose-Capillary: 98 mg/dL (ref 70–99)
Glucose-Capillary: 143 mg/dL — ABNORMAL HIGH (ref 70–99)

## 2019-03-02 LAB — BASIC METABOLIC PANEL
Anion gap: 10 (ref 5–15)
BUN: 14 mg/dL (ref 8–23)
CO2: 26 mmol/L (ref 22–32)
Calcium: 8.9 mg/dL (ref 8.9–10.3)
Chloride: 101 mmol/L (ref 98–111)
Creatinine, Ser: 0.81 mg/dL (ref 0.61–1.24)
GFR calc Af Amer: >60 mL/min (ref >60)
GFR calc non Af Amer: >60 mL/min (ref >60)
Glucose, Bld: 102 mg/dL — ABNORMAL HIGH (ref 70–99)
Potassium: 4.2 mmol/L (ref 3.5–5.1)
Sodium: 137 mmol/L (ref 135–145)

## 2019-03-02 LAB — CBC
HCT: 38.1 % — ABNORMAL LOW (ref 39.0–52.0)
Hemoglobin: 12.1 g/dL — ABNORMAL LOW (ref 13.0–17.0)
MCH: 28.7 pg (ref 26.0–34.0)
MCHC: 31.8 g/dL (ref 30.0–36.0)
MCV: 90.5 fL (ref 80.0–100.0)
Platelets: 457 10*3/uL — ABNORMAL HIGH (ref 150–400)
RBC: 4.21 MIL/uL — ABNORMAL LOW (ref 4.22–5.81)
RDW: 13.4 % (ref 11.5–15.5)
WBC: 12.1 10*3/uL — ABNORMAL HIGH (ref 4.0–10.5)
nRBC: 0 % (ref 0.0–0.2)

## 2019-03-02 LAB — PROTIME-INR
INR: 1.1 (ref 0.8–1.2)
Prothrombin Time: 13.9 seconds (ref 11.4–15.2)

## 2019-03-02 NOTE — Progress Notes (Signed)
PROGRESS NOTE  Eric Santos [id number] DOB: 30-May-1945 DOA: 02/27/2019 PCP: Biagio Borg, MD  HPI/Recap of past 24 hours: Patient is a 73 year old Caucasian male, morbidly obese, with past medical history significant for Paroxysmal atrial fibrillation, SVT, typical atrial flutter status post ablation in 2017, OSA on bipap, nonobstructive coronary artery disease and Type 2 diabetes.  Patient was admitted with nausea, vomiting, diarrhea, atrial fibrillation with rapid ventricular response and chest pressures.  GI symptoms are resolved significantly.  Heart rate has improved to the 80s.  Chest pressure has resolved.  TTE pending, in the setting of recent R prosthetic knee septic arthritis. R knee fluid 01/20/19 grew multidrug-resistant staph epidermis.  03/02/19: Seen and examined.  Reports brief fluttering in his chest this morning lasting just a few seconds.  No chest pain.  Tachycardia and dyspnea with ambulation.  Planned cardioversion tomorrow per cardiology.   Assessment/Plan: Principal Problem:   Atrial fibrillation with RVR (HCC) Active Problems:   Obstructive sleep apnea   Infection of prosthetic right knee joint (HCC)   Type 2 diabetes mellitus with hyperlipidemia (HCC)  Paroxysmal atrial fibrillation with RVR Off diltiazem drip but intermittent RVR Currently on Lopressor p.o. 50 mg 3 times daily. On Eliquis for CVA prevention TSH normal Cardiology following, appreciate assistance.  Planned cardioversion tomorrow per cardiology. 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 2D echo 12/28 no significant change compared to previous study. Leukocytosis continues to improve, WBC continues to trend down. Afebrile.  Continue to monitor fever curve and WBC.  Enterobacter aerogenes UTI UA done last night + pyuria Urine cx  grew greater than 100,000 Enterobacter aerogenes resistant to cefazolin and sensitive to Rocephin Continue rocephin empirically 03/01/19>>  Improving GI symptoms/diarrhea GI symptoms have resolved.  Type 2 diabetes, well controlled Hemoglobin A1c 6.3 on 02/28/2019 Continue insulin sliding scale Avoid hypoglycemia  Hyperlipidemia Continue statin  OSA Continue Bipapnightly  Physical debility PT OT assessed no PT follow-up Continue fall precautions on NOAC.   DVT prophylaxis: Eliquis Code Status: DNR Family Communication:  Updated patient and spouse at bedside.   Disposition Plan: Home once cardiology signs off.   Consultants:   Cardiology  Procedures:  Echocardiogram  Antimicrobials:   IV daptomycin  IV Rocephin 12/28>>>     Objective: Vitals:   03/02/19 0935 03/02/19 1326 03/02/19 1327 03/02/19 1353  BP: 97/75 96/70 96/70  100/79  Pulse: 98   84  Resp:    18  Temp:    97.9 F (36.6 C)  TempSrc:    Oral  SpO2:    97%  Weight:      Height:        Intake/Output Summary (Last 24 hours) at 03/02/2019 1547 Last data filed at 03/02/2019 1300 Gross per 24 hour  Intake 582 ml  Output 475 ml  Net 107 ml   Filed Weights   02/28/19 0310 03/01/19 0526 03/02/19 0447  Weight: 111.2 kg 110.3 kg 110.2 kg    Exam:  . General: 73 y.o. year-old male well-developed well-nourished in no acute distress.  Alert and oriented x3.   . Cardiovascular: Irregular rate and rhythm no rubs or gallops. Marland Kitchen Respiratory: Clear consultation no wheezes no rales.   . Abdomen: Soft nontender normal bowel sounds present. . Musculoskeletal: Trace lower extremity edema bilaterally.  Right knee nontender no erythema, no warmth.   Psychiatry: Mood is appropriate for condition  and setting.  Data Reviewed: CBC: Recent Labs  Lab 02/27/19 1712 02/28/19 0207 03/01/19 0620 03/02/19 0500  WBC 17.0* 13.6* 15.1* 12.1*  HGB 12.6* 12.0* 12.5* 12.1*  HCT 39.6 37.1* 39.3 38.1*   MCV 92.1 88.8 91.8 90.5  PLT 367 390 470* [id number]*   Basic Metabolic Panel: Recent Labs  Lab 02/27/19 1712 02/28/19 0207 03/01/19 0620 03/02/19 0500  NA 133* 136 134* 137  K 4.6 4.0 4.4 4.2  CL 100 103 97* 101  CO2 20* 22 25 26   GLUCOSE 131* 126* 102* 102*  BUN 14 9 12 14   CREATININE 0.69 0.60* 0.73 0.81  CALCIUM 8.7* 8.6* 8.8* 8.9   GFR: Estimated Creatinine Clearance: 96.1 mL/min (by C-G formula based on SCr of 0.81 mg/dL). Liver Function Tests: No results for input(s): AST, ALT, ALKPHOS, BILITOT, PROT, ALBUMIN in the last 168 hours. No results for input(s): LIPASE, AMYLASE in the last 168 hours. No results for input(s): AMMONIA in the last 168 hours. Coagulation Profile: No results for input(s): INR, PROTIME in the last 168 hours. Cardiac Enzymes: Recent Labs  Lab 02/28/19 0207  CKTOTAL 38*   BNP (last 3 results) No results for input(s): PROBNP in the last 8760 hours. HbA1C: Recent Labs    02/28/19 0207  HGBA1C 6.3*   CBG: Recent Labs  Lab 03/01/19 1210 03/01/19 1645 03/01/19 2110 03/02/19 0743 03/02/19 1140  GLUCAP 114* 95 159* 115* 120*   Lipid Profile: No results for input(s): CHOL, HDL, LDLCALC, TRIG, CHOLHDL, LDLDIRECT in the last 72 hours. Thyroid Function Tests: Recent Labs    02/28/19 0207  TSH 1.628   Anemia Panel: No results for input(s): VITAMINB12, FOLATE, FERRITIN, TIBC, IRON, RETICCTPCT in the last 72 hours. Urine analysis:    Component Value Date/Time   COLORURINE YELLOW 02/28/2019 2113   APPEARANCEUR HAZY (A) 02/28/2019 2113   LABSPEC 1.014 02/28/2019 2113   PHURINE 6.0 02/28/2019 2113   GLUCOSEU NEGATIVE 02/28/2019 2113   GLUCOSEU NEGATIVE 07/20/2018 0739   HGBUR SMALL (A) 02/28/2019 2113   BILIRUBINUR NEGATIVE 02/28/2019 2113   [place] NEGATIVE 02/28/2019 2113   PROTEINUR 30 (A) 02/28/2019 2113   UROBILINOGEN 1.0 07/20/2018 0739   NITRITE POSITIVE (A) 02/28/2019 2113   LEUKOCYTESUR MODERATE (A) 02/28/2019 2113   Sepsis  Labs: @LABRCNTIP (procalcitonin:4,lacticidven:4)  ) Recent Results (from the past 240 hour(s))  SARS CORONAVIRUS 2 (TAT 6-24 HRS) Nasopharyngeal Nasopharyngeal Swab     Status: None   Collection Time: 02/27/19  9:10 PM   Specimen: Nasopharyngeal Swab  Result Value Ref Range Status   SARS Coronavirus 2 NEGATIVE NEGATIVE Final    Comment: (NOTE) SARS-CoV-2 target nucleic acids are NOT DETECTED. The SARS-CoV-2 RNA is generally detectable in upper and lower respiratory specimens during the acute phase of infection. Negative results do not preclude SARS-CoV-2 infection, do not rule out co-infections with other pathogens, and should not be used as the sole basis for treatment or other patient management decisions. Negative results must be combined with clinical observations, patient history, and epidemiological information. The expected result is Negative. Fact Sheet for Patients: SugarRoll.be Fact Sheet for Healthcare Providers: https://www.woods-mathews.com/ This test is not yet approved or cleared by the [place] FDA and  has been authorized for detection and/or diagnosis of SARS-CoV-2 by FDA under an Emergency Use Authorization (EUA). This EUA will remain  in effect (meaning this test can be used) for the duration of the COVID-19 declaration under Section 56 4(b)(1) of the Act, 21 U.S.C. section 360bbb-3(b)(1), unless the  authorization is terminated or revoked sooner. Performed at [facility], [place] [address], [place], [place] [address]   Culture, blood (routine x 2)     Status: None (Preliminary result)   Collection Time: 02/28/19  2:07 AM   Specimen: BLOOD RIGHT HAND  Result Value Ref Range Status   Specimen Description BLOOD RIGHT HAND  Final   Special Requests   Final    BOTTLES DRAWN AEROBIC AND ANAEROBIC Blood Culture adequate volume   Culture   Final    NO GROWTH 2 DAYS Performed at [facility], [place] [address] [place], [place] [address]    Report Status PENDING  Incomplete  Culture, blood (routine x 2)     Status: None (Preliminary result)   Collection Time: 02/28/19  3:24 AM   Specimen: BLOOD RIGHT HAND  Result Value Ref Range Status   Specimen Description BLOOD RIGHT HAND  Final   Special Requests   Final    BOTTLES DRAWN AEROBIC ONLY Blood Culture adequate volume   Culture   Final    NO GROWTH 2 DAYS Performed at [facility], [place] [address], [place], [place] [address]    Report Status PENDING  Incomplete  Culture, Urine     Status: Abnormal   Collection Time: 02/28/19  7:05 PM   Specimen: Urine, Random  Result Value Ref Range Status   Specimen Description URINE, RANDOM  Final   Special Requests   Final    NONE Performed at [facility], [place] [address] [place], [place] [address]    Culture >=100,000 COLONIES/mL ENTEROBACTER AEROGENES (A)  Final   Report Status 03/02/2019 FINAL  Final   Organism ID, Bacteria ENTEROBACTER AEROGENES (A)  Final      Susceptibility   Enterobacter aerogenes - MIC*    CEFAZOLIN RESISTANT Resistant     CEFTRIAXONE <=1 SENSITIVE Sensitive     CIPROFLOXACIN <=0.25 SENSITIVE Sensitive     GENTAMICIN <=1 SENSITIVE Sensitive     IMIPENEM 1 SENSITIVE Sensitive     NITROFURANTOIN 64 INTERMEDIATE Intermediate     TRIMETH/SULFA <=20 SENSITIVE Sensitive     PIP/TAZO 16 SENSITIVE Sensitive     * >=100,000 COLONIES/mL ENTEROBACTER AEROGENES      Studies: No results found.  Scheduled Meds: . acidophilus  1 capsule Oral QHS  . apixaban  5 mg Oral BID  . Chlorhexidine Gluconate Cloth  6 each Topical Daily  . diltiazem  60 mg Oral Q6H  . influenza vaccine adjuvanted  0.5 mL Intramuscular Once  . insulin aspart  0-15 Units Subcutaneous TID WC  . loratadine  10 mg Oral Daily  . metoprolol tartrate  50 mg Oral TID    Continuous Infusions: . cefTRIAXone (ROCEPHIN)  IV 1 g (03/02/19 1325)  . DAPTOmycin (CUBICIN)  IV 500 mg (02/28/19 2122)      LOS: 2 days     Kayleen Memos, MD [facility] Pager [phone]  If 7PM-7AM, please contact night-coverage [email or web address] Password [facility] 03/02/2019, 3:47 PM

## 2019-03-02 NOTE — Evaluation (Signed)
Occupational Therapy Evaluation Patient Details Name: Eric Santos MRN: [id number] DOB: [birth year] Today's Date: 03/02/2019    History of Present Illness 73yo male c/o nausea/vomiting, diarrhea, A-fib, chest pressure. 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 Covid negative at admit. He was ultimately admitted due to A-fib with RVR. PMH A-fib, DM, HLD, TKR R with multiple revisions and I&Ds, cardioversion   Clinical Impression   This 73 y/o male presents with the above. PTA pt reports independence with ADL and functional mobility. Pt presents supine in bed pleasant and willing to participate in therapy session. Pt completing room level mobility without AD with overall minguard assist. Pt completing standing grooming, LB and toileting ADL with supervision-minguard assist throughout. Pt with fluctuating HR, max HR noted into the upper 140s while standing to void, fluctuating in the 120s/130s with additional standing activity, returns to low 100s with seated rest and 90s-100s end of session seated in recliner. Pt's spouse present and supportive throughout, available to assist PRN after discharge. Pt will benefit from continued acute OT services to maximize his safety and independence with ADL and mobility prior to return home. Will continue to follow.     Follow Up Recommendations  No OT follow up;Supervision - Intermittent    Equipment Recommendations  None recommended by OT           Precautions / Restrictions Precautions Precautions: Fall Precaution Comments: watch HR Restrictions Weight Bearing Restrictions: No      Mobility Bed Mobility Overal bed mobility: Needs Assistance Bed Mobility: Supine to Sit     Supine to sit: Supervision     General bed mobility comments: for lines and safety  Transfers Overall transfer level: Needs assistance Equipment used: None Transfers: Sit  to/from Stand Sit to Stand: Supervision         General transfer comment: for safety and balance    Balance Overall balance assessment: Mild deficits observed, not formally tested Sitting-balance support: No upper extremity supported;Feet supported Sitting balance-Leahy Scale: Normal     Standing balance support: No upper extremity supported Standing balance-Leahy Scale: Good                             ADL either performed or assessed with clinical judgement   ADL Overall ADL's : Needs assistance/impaired Eating/Feeding: Modified independent;Sitting   Grooming: Supervision/safety;Standing;Wash/dry hands;Oral care   Upper Body Bathing: Modified independent;Sitting   Lower Body Bathing: Min guard;Sit to/from stand   Upper Body Dressing : Modified independent;Sitting   Lower Body Dressing: Min guard;Sit to/from stand Lower Body Dressing Details (indicate cue type and reason): pt donning shoes seated EOB with setup assist Toilet Transfer: Min guard;Ambulation   Toileting- Clothing Manipulation and Hygiene: Min guard;Sit to/from stand Toileting - Clothing Manipulation Details (indicate cue type and reason): pt standing to void bladder in bathroom with minguard for safety     Functional mobility during ADLs: Min guard       Vision         Perception     Praxis      Pertinent Vitals/Pain Pain Assessment: No/denies pain     Hand Dominance Right   Extremity/Trunk Assessment Upper Extremity Assessment Upper Extremity Assessment: Overall WFL for tasks assessed   Lower Extremity Assessment Lower Extremity Assessment: Defer to PT evaluation   Cervical / Trunk Assessment Cervical / Trunk Assessment:  Normal   Communication Communication Communication: No difficulties   Cognition Arousal/Alertness: Awake/alert Behavior During Therapy: WFL for tasks assessed/performed Overall Cognitive Status: Within Functional Limits for tasks assessed                                  General Comments: A&Ox4, very pleasant and cooperative   General Comments       Exercises     Shoulder Instructions      Home Living Family/patient expects to be discharged to:: Private residence Living Arrangements: Spouse/significant other Available Help at Discharge: Family;Available 24 hours/day Type of Home: House Home Access: Stairs to enter;Ramped entrance Entrance Stairs-Number of Steps: 1 step Entrance Stairs-Rails: None Home Layout: One level     Bathroom Shower/Tub: Occupational psychologist: Handicapped height Bathroom Accessibility: Yes   Home Equipment: Wheelchair - power;Grab bars - toilet;Shower seat - built in;Walker - 2 wheels;Cane - single point;Grab bars - tub/shower;Bedside commode   Additional Comments: 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      Prior Functioning/Environment Level of Independence: Independent        Comments: 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        OT Problem List: Decreased strength;Decreased activity tolerance;Cardiopulmonary status limiting activity      OT Treatment/Interventions: Self-care/ADL training;Therapeutic exercise;DME and/or AE instruction;Therapeutic activities;Patient/family education;Balance training;Energy conservation    OT Goals(Current goals can be found in the care plan section) Acute Rehab OT Goals Patient Stated Goal: go home soon OT Goal Formulation: With patient Time For Goal Achievement: 03/16/19 Potential to Achieve Goals: Good  OT Frequency: Min 2X/week   Barriers to D/C:            Co-evaluation              AM-PAC OT "6 Clicks" Daily Activity     Outcome Measure Help from another person eating meals?: None Help from another person taking care of personal grooming?: None Help from another person toileting, which includes using toliet, bedpan, or urinal?:  None Help from another person bathing (including washing, rinsing, drying)?: A Little Help from another person to put on and taking off regular upper body clothing?: None Help from another person to put on and taking off regular lower body clothing?: A Little 6 Click Score: 22   End of Session Nurse Communication: Mobility status  Activity Tolerance: Patient tolerated treatment well Patient left: in chair;with call bell/phone within reach;with family/visitor present  OT Visit Diagnosis: Other abnormalities of gait and mobility (R26.89);Muscle weakness (generalized) (M62.81)                Time: [id number] OT Time Calculation (min): 17 min Charges:  OT General Charges $OT Visit: 1 Visit OT Evaluation $OT Eval Moderate Complexity: [place], OT [employer] Pager [phone] Office [phone]  Raymondo Band 03/02/2019, 4:43 PM

## 2019-03-02 NOTE — Progress Notes (Signed)
Physical Therapy Treatment Patient Details Name: Eric Santos MRN: [id number] DOB: 05-09-1945 Today's Date: 03/02/2019    History of Present Illness 73yo male c/o nausea/vomiting, diarrhea, A-fib, chest pressure. 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 Covid negative at admit. He was ultimately admitted due to A-fib with RVR. PMH A-fib, DM, HLD, TKR R with multiple revisions and I&Ds, cardioversion    PT Comments    Pt able to increase gait distance today.  HR low 100's rest and up to low 120's with walking.  Required skilled PT services for cues for safety with transfers and progressing activity with vitals monitored. Pt ambulated 300' without AD.  Cont POC.    Follow Up Recommendations  No PT follow up     Equipment Recommendations  None recommended by PT    Recommendations for Other Services       Precautions / Restrictions Precautions Precautions: Fall Precaution Comments: watch HR Restrictions Weight Bearing Restrictions: No    Mobility  Bed Mobility               General bed mobility comments: OOB in chair upon arrival  Transfers Overall transfer level: Needs assistance Equipment used: None Transfers: Sit to/from Stand Sit to Stand: Supervision            Ambulation/Gait Ambulation/Gait assistance: Min guard;Supervision Gait Distance (Feet): 300 Feet Assistive device: None Gait Pattern/deviations: Step-through pattern;Wide base of support     General Gait Details: Started with laps in room to monitor HR then progressed to hallway ambulation; progressed from min guard to supervision; HR 95-105 bpm rest and up to 115-122 bpm walking   Stairs             Wheelchair Mobility    Modified Rankin (Stroke Patients Only)       Balance Overall balance assessment: Needs assistance Sitting-balance support: No upper extremity supported;Feet  supported Sitting balance-Leahy Scale: Normal     Standing balance support: No upper extremity supported Standing balance-Leahy Scale: Good                              Cognition Arousal/Alertness: Awake/alert Behavior During Therapy: WFL for tasks assessed/performed Overall Cognitive Status: Within Functional Limits for tasks assessed                                 General Comments: A&Ox4, very pleasant and cooperative      Exercises      General Comments        Pertinent Vitals/Pain Pain Assessment: No/denies pain    Home Living                      Prior Function            PT Goals (current goals can now be found in the care plan section) Progress towards PT goals: Progressing toward goals    Frequency    Min 3X/week      PT Plan Current plan remains appropriate    Co-evaluation              AM-PAC PT "6 Clicks" Mobility   Outcome Measure  Help needed turning from your back to your side while in a flat bed without using bedrails?: None Help needed  moving from lying on your back to sitting on the side of a flat bed without using bedrails?: None Help needed moving to and from a bed to a chair (including a wheelchair)?: None Help needed standing up from a chair using your arms (e.g., wheelchair or bedside chair)?: None Help needed to walk in hospital room?: None Help needed climbing 3-5 steps with a railing? : A Little 6 Click Score: 23    End of Session Equipment Utilized During Treatment: Gait belt Activity Tolerance: Patient tolerated treatment well Patient left: in chair;with call bell/phone within reach;with family/visitor present Nurse Communication: Mobility status PT Visit Diagnosis: Difficulty in walking, not elsewhere classified (R26.2)     Time: [id number] PT Time Calculation (min) (ACUTE ONLY): 12 min  Charges:  $Gait Training: 8-22 mins                     Maggie Font, PT Acute Rehab  Services Pager [phone] [facility] [phone] [facility] [place] 03/02/2019, 4:30 PM

## 2019-03-02 NOTE — Progress Notes (Signed)
Progress Note  Patient Name: Eric Santos Date of Encounter: 03/02/2019  Primary Cardiologist: [name], MD   Subjective   HR around 100 bpm Did not tolerate Vancomycin now on Rocephin   Inpatient Medications    Scheduled Meds: . acidophilus  1 capsule Oral QHS  . apixaban  5 mg Oral BID  . Chlorhexidine Gluconate Cloth  6 each Topical Daily  . diltiazem  60 mg Oral Q6H  . influenza vaccine adjuvanted  0.5 mL Intramuscular Once  . insulin aspart  0-15 Units Subcutaneous TID WC  . loratadine  10 mg Oral Daily  . metoprolol tartrate  50 mg Oral TID   Continuous Infusions: . cefTRIAXone (ROCEPHIN)  IV 1 g (03/01/19 1254)  . DAPTOmycin (CUBICIN)  IV 500 mg (02/28/19 2122)   PRN Meds: ondansetron (ZOFRAN) IV, sodium chloride flush, tiZANidine, traMADol   Vital Signs    Vitals:   03/01/19 2021 03/02/19 0447 03/02/19 0448 03/02/19 0518  BP: 113/84  95/73 103/76  Pulse: 88  76   Resp: 19  18   Temp: 99.3 F (37.4 C)  98.1 F (36.7 C)   TempSrc: Oral  Oral   SpO2: 96%  96%   Weight:  110.2 kg    Height:        Intake/Output Summary (Last 24 hours) at 03/02/2019 0846 Last data filed at 03/02/2019 0700 Gross per 24 hour  Intake 360 ml  Output 252 ml  Net 108 ml   Last 3 Weights 03/02/2019 03/01/2019 02/28/2019  Weight (lbs) 242 lb 15.2 oz 243 lb 1.6 oz 245 lb 3.2 oz  Weight (kg) 110.2 kg 110.269 kg 111.222 kg      Telemetry    Atrial fibrillation with rates in the 90s-100s overnight up to 130s-170s this morning- Personally Reviewed  ECG    No new tracings for review  Physical Exam   Affect appropriate Healthy:  appears stated age HEENT: normal Neck supple with no adenopathy JVP normal no bruits no thyromegaly Lungs clear with no wheezing and good diaphragmatic motion Heart:  S1/S2 no murmur, no rub, gallop or click PMI normal Abdomen: benighn, BS positve, no tenderness, no AAA no bruit.  No HSM or HJR Distal pulses intact with no bruits No  edema Neuro non-focal Skin warm and dry Right knee infection post TKR PIC line in RUE    Labs    High Sensitivity Troponin:   Recent Labs  Lab 02/27/19 1712 02/27/19 1838  TROPONINIHS 14 12      Chemistry Recent Labs  Lab 02/28/19 0207 03/01/19 0620 03/02/19 0500  NA 136 134* 137  K 4.0 4.4 4.2  CL 103 97* 101  CO2 22 25 26   GLUCOSE 126* 102* 102*  BUN 9 12 14   CREATININE 0.60* 0.73 0.81  CALCIUM 8.6* 8.8* 8.9  GFRNONAA >60 >60 >60  GFRAA >60 >60 >60  ANIONGAP 11 12 10      Hematology Recent Labs  Lab 02/28/19 0207 03/01/19 0620 03/02/19 0500  WBC 13.6* 15.1* 12.1*  RBC 4.18* 4.28 4.21*  HGB 12.0* 12.5* 12.1*  HCT 37.1* 39.3 38.1*  MCV 88.8 91.8 90.5  MCH 28.7 29.2 28.7  MCHC 32.3 31.8 31.8  RDW 13.2 13.4 13.4  PLT 390 470* 457*    BNPNo results for input(s): BNP, PROBNP in the last 168 hours.   DDimer No results for input(s): DDIMER in the last 168 hours.   Radiology    ECHOCARDIOGRAM COMPLETE  Result Date: 03/01/2019  ECHOCARDIOGRAM REPORT   Patient Name:   TYLEIK LIVERS Date of Exam: 03/01/2019 Medical Rec #:  [id number]      Height:       66.0 in Accession #:    [id number]     Weight:       243.1 lb Date of Birth:  [birth year]     BSA:          2.17 m Patient Age:    73 years       BP:           101/77 mmHg Patient Gender: M              HR:           113 bpm. Exam Location:  Inpatient Procedure: 2D Echo, Color Doppler and Cardiac Doppler Indications:    Septic Arthritis  History:        Patient has prior history of Echocardiogram examinations, most                 recent 07/10/2017. Arrythmias:Atrial Fibrillation; Risk                 Factors:Sleep Apnea, Diabetes and Dyslipidemia.  Sonographer:    Raquel Sarna Senior RDCS Referring Phys: [phone] MIHAI CROITORU  Sonographer Comments: Technically difficult study due to poor echo windows. IMPRESSIONS  1. Left ventricular ejection fraction, by visual estimation, is 50 to 55%. The left ventricle has low normal  function. There is mildly increased left ventricular hypertrophy.  2. Left ventricular diastolic function could not be evaluated.  3. The left ventricle has no regional wall motion abnormalities.  4. Global right ventricle has normal systolic function.The right ventricular size is normal. No increase in right ventricular wall thickness.  5. Left atrial size was mildly dilated.  6. Right atrial size was normal.  7. The mitral valve was not well visualized. Trivial mitral valve regurgitation.  8. The tricuspid valve is not well visualized.  9. The aortic valve was not well visualized. Aortic valve regurgitation is trivial. Mild aortic valve sclerosis without stenosis. 10. The pulmonic valve was not well visualized. Pulmonic valve regurgitation is not visualized. 11. The inferior vena cava is normal in size with <50% respiratory variability, suggesting right atrial pressure of 8 mmHg. 12. The interatrial septum was not assessed. 13. Suboptimal windows, no obvious large valvular vegetations. Consider TEE if pretest probability for endocarditis is high. FINDINGS  Left Ventricle: Left ventricular ejection fraction, by visual estimation, is 50 to 55%. The left ventricle has low normal function. The left ventricle has no regional wall motion abnormalities. There is mildly increased left ventricular hypertrophy. The  left ventricular diastology could not be evaluated due to atrial fibrillation. Left ventricular diastolic function could not be evaluated. Right Ventricle: The right ventricular size is normal. No increase in right ventricular wall thickness. Global RV systolic function is has normal systolic function. Left Atrium: Left atrial size was mildly dilated. Right Atrium: Right atrial size was normal in size Pericardium: There is no evidence of pericardial effusion. Mitral Valve: The mitral valve was not well visualized. Trivial mitral valve regurgitation. Tricuspid Valve: The tricuspid valve is not well visualized.  Tricuspid valve regurgitation is trivial. Aortic Valve: The aortic valve was not well visualized. Aortic valve regurgitation is trivial. Mild aortic valve sclerosis is present, with no evidence of aortic valve stenosis. Pulmonic Valve: The pulmonic valve was not well visualized. Pulmonic valve regurgitation is not visualized. Pulmonic regurgitation is not visualized. Aorta:  The aortic root and ascending aorta are structurally normal, with no evidence of dilitation. Venous: The inferior vena cava is normal in size with less than 50% respiratory variability, suggesting right atrial pressure of 8 mmHg. IAS/Shunts: The interatrial septum was not assessed.  LEFT VENTRICLE PLAX 2D LVIDd:         4.46 cm LVIDs:         3.35 cm LV PW:         1.06 cm LV IVS:        1.17 cm LVOT diam:     2.00 cm LV SV:         45 ml LV SV Index:   19.31 LVOT Area:     3.14 cm  RIGHT VENTRICLE RV S prime:     15.70 cm/s TAPSE (M-mode): 1.7 cm LEFT ATRIUM             Index       RIGHT ATRIUM           Index LA diam:        4.30 cm 1.98 cm/m  RA Area:     18.60 cm LA Vol (A2C):   61.3 ml 28.21 ml/m RA Volume:   47.70 ml  21.95 ml/m LA Vol (A4C):   84.5 ml 38.89 ml/m LA Biplane Vol: 78.5 ml 36.13 ml/m  AORTIC VALVE LVOT Vmax:   59.10 cm/s LVOT Vmean:  40.900 cm/s LVOT VTI:    0.095 m  AORTA Ao Root diam: 2.90 cm Ao Asc diam:  3.70 cm  SHUNTS Systemic VTI:  0.10 m Systemic Diam: 2.00 cm  Lyman Bishop MD Electronically signed by Lyman Bishop MD Signature Date/Time: 03/01/2019/10:32:58 AM    Final     Cardiac Studies   LEFT HEART CATH AND CORONARY ANGIOGRAPHY 12/30/2017  Conclusion    Mid Cx to Dist Cx lesion is 30% stenosed.  Prox LAD lesion is 25% stenosed.  The left ventricular systolic function is normal.  LV end diastolic pressure is mildly elevated.  The left ventricular ejection fraction is 55-65% by visual estimate.   1. 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 Normal LV systolic function with  LVEF 55-65%    Echocardiogram 07/10/2017 Study Conclusion - Left ventricle: The cavity size was normal. Wall thickness was   increased in a pattern of mild LVH. Systolic function was normal.   The estimated ejection fraction was in the range of 55% to 60%. - Aortic valve: There was mild regurgitation. - Right ventricle: The cavity size was mildly dilated. Wall   thickness was normal.   Impressions: - No significant change compared to previous study.  Patient Profile     73 y.o. male  with a history of PAF, typical atrial flutter s/p ablation in 2017, nonobs CAD, PSVT, OSA on CPAP, DMII, and HL, who is being seen for the evaluation and treatment of Afib w/ RVR.  Assessment & Plan    Persistent atrial fibrillation with rapid ventricular response/paroxysmal atrial flutter Post flutter ablation in 2017.  Has been on Rx eliquis Discussed rationale for [facility] prior to d/c home will arrange for tomorrow at 10:30 with Dr Debara Pickett Risks discussed willing to proceed orders written   Chest pressure/nonobstructive CAD -Patient noted chest pressure in the setting of A. fib with RVR.  Patient had prolonged symptoms but troponins remained normal.  He was noted to have mild nonobstructive CAD by cath in 2019. -Continues on statin.  No aspirin in the setting  of anticoagulation.  Diarrhea -improved C diff negative   Right prosthetic knee infection -Diagnosed in November.  Patient was treated with outpatient IV antibiotics.  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  He has follow-up with infectious disease as an outpatient.  Hyperglycemia -Recently placed on Metformin.  A1c 6.3.  Hyperlipidemia -LDL was 58 in May, well controlled.  Continue current statin.    For questions or updates, please contact [place] Please consult [email or web address] for contact info under        Signed, [name], MD  03/02/2019, 8:46  AM

## 2019-03-02 NOTE — Progress Notes (Signed)
Pharmacy Antibiotic Note  Eric Santos is a 73 y.o. male admitted on 02/27/2019 with septic knee.  Pharmacy has been consulted for cubicin dosing. He was receiving cubicin 500 mg daily as outpt. 12/27 CK = 38  Plan: Cont cubicin 500 mg IV q24 hrs. Planning to continue until 1/6  Height: 5\' 6"  (167.6 cm) Weight: 242 lb 15.2 oz (110.2 kg) IBW/kg (Calculated) : 63.8  Temp (24hrs), Avg:98.4 F (36.9 C), Min:97.9 F (36.6 C), Max:99.3 F (37.4 C)  Recent Labs  Lab 02/27/19 1712 02/28/19 0207 03/01/19 0620 03/02/19 0500  WBC 17.0* 13.6* 15.1* 12.1*  CREATININE 0.69 0.60* 0.73 0.81    Estimated Creatinine Clearance: 96.1 mL/min (by C-G formula based on SCr of 0.81 mg/dL).    Allergies  Allergen Reactions  . Adhesive [Tape] Other (See Comments)    "Peels off skin"     Thank you for allowing pharmacy to be a part of this patient's care.  Marguerite Olea, [facility] Clinical Pharmacist Phone [phone]  03/02/2019 2:03 PM

## 2019-03-03 ENCOUNTER — Encounter (HOSPITAL_COMMUNITY): Payer: Self-pay | Admitting: Internal Medicine

## 2019-03-03 ENCOUNTER — Encounter (HOSPITAL_COMMUNITY)
Admission: EM | Disposition: A | Discharge status: Home Health | Payer: Self-pay | Source: Home / Self Care | Attending: Internal Medicine

## 2019-03-03 ENCOUNTER — Inpatient Hospital Stay (HOSPITAL_COMMUNITY): Payer: Medicare Other | Admitting: Certified Registered Nurse Anesthetist

## 2019-03-03 DIAGNOSIS — R739 Hyperglycemia, unspecified: Secondary | ICD-10-CM

## 2019-03-03 HISTORY — PX: CARDIOVERSION: SHX1299

## 2019-03-03 LAB — CBC
HCT: 39 % (ref 39.0–52.0)
Hemoglobin: 12.3 g/dL — ABNORMAL LOW (ref 13.0–17.0)
MCH: 28.8 pg (ref 26.0–34.0)
MCHC: 31.5 g/dL (ref 30.0–36.0)
MCV: 91.3 fL (ref 80.0–100.0)
Platelets: 489 10*3/uL — ABNORMAL HIGH (ref 150–400)
RBC: 4.27 MIL/uL (ref 4.22–5.81)
RDW: 13.4 % (ref 11.5–15.5)
WBC: 10 10*3/uL (ref 4.0–10.5)
nRBC: 0 % (ref 0.0–0.2)

## 2019-03-03 LAB — BASIC METABOLIC PANEL
Anion gap: 11 (ref 5–15)
BUN: 14 mg/dL (ref 8–23)
CO2: 24 mmol/L (ref 22–32)
Calcium: 8.7 mg/dL — ABNORMAL LOW (ref 8.9–10.3)
Chloride: 103 mmol/L (ref 98–111)
Creatinine, Ser: 0.89 mg/dL (ref 0.61–1.24)
GFR calc Af Amer: >60 mL/min (ref >60)
GFR calc non Af Amer: >60 mL/min (ref >60)
Glucose, Bld: 111 mg/dL — ABNORMAL HIGH (ref 70–99)
Potassium: 4.4 mmol/L (ref 3.5–5.1)
Sodium: 138 mmol/L (ref 135–145)

## 2019-03-03 LAB — GLUCOSE, CAPILLARY
Glucose-Capillary: 118 mg/dL — ABNORMAL HIGH (ref 70–99)
Glucose-Capillary: 97 mg/dL (ref 70–99)
Glucose-Capillary: 164 mg/dL — ABNORMAL HIGH (ref 70–99)
Glucose-Capillary: 87 mg/dL (ref 70–99)

## 2019-03-03 SURGERY — CARDIOVERSION
Anesthesia: General

## 2019-03-03 MED ORDER — PROPOFOL 10 MG/ML IV BOLUS
INTRAVENOUS | Status: DC | PRN
  Administered 2019-03-03: 50 mg via INTRAVENOUS

## 2019-03-03 MED ORDER — SODIUM CHLORIDE 0.9 % IV SOLN: INTRAVENOUS | Status: DC | PRN

## 2019-03-03 MED ORDER — LIDOCAINE 2% (20 MG/ML) 5 ML SYRINGE
INTRAMUSCULAR | Status: DC | PRN
  Administered 2019-03-03: 40 mg via INTRAVENOUS

## 2019-03-03 NOTE — Transfer of Care (Signed)
Immediate Anesthesia Transfer of Care Note  Patient: Eric Santos  Procedure(s) Performed: CARDIOVERSION (N/A )  Patient Location: Endoscopy Unit  Anesthesia Type:General  Level of Consciousness: drowsy  Airway & Oxygen Therapy: Patient Spontanous Breathing and Patient connected to nasal cannula oxygen  Post-op Assessment: Report given to RN and Post -op Vital signs reviewed and stable  Post vital signs: Reviewed  Last Vitals:  Vitals Value Taken Time  BP    Temp    Pulse    Resp    SpO2      Last Pain:  Vitals:   03/03/19 0948  TempSrc: Temporal  PainSc: 0-No pain      Patients Stated Pain Goal: 0 ([id number])  Complications: No apparent anesthesia complications

## 2019-03-03 NOTE — Anesthesia Postprocedure Evaluation (Signed)
Anesthesia Post Note  Patient: Eric Santos  Procedure(s) Performed: CARDIOVERSION (N/A )     Patient location during evaluation: PACU Anesthesia Type: General Level of consciousness: awake and alert Pain management: pain level controlled Vital Signs Assessment: post-procedure vital signs reviewed and stable Respiratory status: spontaneous breathing, nonlabored ventilation, respiratory function stable and patient connected to nasal cannula oxygen Cardiovascular status: blood pressure returned to baseline and stable Postop Assessment: no apparent nausea or vomiting Anesthetic complications: no    Last Vitals:  Vitals:   03/03/19 1150 03/03/19 1205  BP: 107/76 127/86  Pulse: 78 79  Resp: (!) 21 19  Temp:    SpO2: 100% 98%    Last Pain:  Vitals:   03/03/19 1205  TempSrc:   PainSc: 0-No pain                 [name]

## 2019-03-03 NOTE — Progress Notes (Signed)
PROGRESS NOTE  Eric Santos [id number] DOB: [birth year] DOA: 02/27/2019 PCP: Biagio Borg, MD  HPI/Recap of past 24 hours: Patient is a 73 year old Caucasian male, morbidly obese, with past medical history significant for Paroxysmal atrial fibrillation, SVT, typical atrial flutter status post ablation in 2017, OSA on bipap, nonobstructive coronary artery disease and Type 2 diabetes.  Patient was admitted with nausea, vomiting, diarrhea, atrial fibrillation with rapid ventricular response and chest pressures.  GI symptoms are resolved significantly.  Heart rate has improved to the 80s.  Chest pressure has resolved.  TTE pending, in the setting of recent R prosthetic knee septic arthritis. R knee fluid 01/20/19 grew multidrug-resistant staph epidermis.  03/03/19: Seen and examined.  No new complaints.  Denies fluttering or chest pain.  No dyspnea at rest in the bed.  Planned [facility] today with Dr. Debara Pickett.  Assessment/Plan: Principal Problem:   Atrial fibrillation with RVR (HCC) Active Problems:   Obstructive sleep apnea   Infection of prosthetic right knee joint (HCC)   Type 2 diabetes mellitus with hyperlipidemia (HCC)  Paroxysmal atrial fibrillation with RVR Off diltiazem drip but intermittent RVR Currently on Lopressor p.o. 50 mg 3 times daily and p.o. diltiazem 60 mg 4 times daily. On Eliquis for primary CVA prevention TSH normal Cardiology following, appreciate assistance.  Planned cardioversion today with Dr. Debara Pickett. 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 2D echo 12/28 no significant change compared to previous study. Leukocytosis continues to improve, WBC continues to trend down. Leukocytosis has resolved.  Afebrile.  Enterobacter aerogenes UTI UA done last night + pyuria Urine cx grew greater than 100,000 Enterobacter  aerogenes resistant to cefazolin and sensitive to Rocephin Continue rocephin empirically 03/01/19>>  Improving GI symptoms/diarrhea GI symptoms have resolved.  Type 2 diabetes, well controlled Hemoglobin A1c 6.3 on 02/28/2019 Continue insulin sliding scale Avoid hypoglycemia  Hyperlipidemia Continue statin  OSA Continue Bipapnightly  Physical debility PT OT assessed no PT follow-up Continue fall precautions on NOAC.   DVT prophylaxis: Eliquis Code Status: DNR Family Communication:  Updated patient and spouse at bedside.   Disposition Plan: Home once cardiology signs off.   Consultants:   Cardiology  Procedures:  Echocardiogram  Antimicrobials:   IV daptomycin  IV Rocephin 12/28>>>     Objective: Vitals:   03/02/19 1712 03/02/19 2105 03/03/19 0422 03/03/19 0834  BP: 99/83 101/81 109/81 91/67  Pulse: 85 80 86   Resp:  17 19   Temp:  98 F (36.7 C) (!) 97.5 F (36.4 C)   TempSrc:  Oral Oral   SpO2:  100% 95%   Weight:      Height:        Intake/Output Summary (Last 24 hours) at 03/03/2019 0911 Last data filed at 03/03/2019 0430 Gross per 24 hour  Intake 1382 ml  Output 2495 ml  Net -1113 ml   Filed Weights   02/28/19 0310 03/01/19 0526 03/02/19 0447  Weight: 111.2 kg 110.3 kg 110.2 kg    Exam:  . General: 73 y.o. year-old male pleasant well-developed well-nourished no acute distress.  Alert oriented x3.   . Cardiovascular: Irregular rate and rhythm no rubs or gallops. Marland Kitchen Respiratory: Clear to auscultation no wheezes no rales.   . Abdomen: Soft Nontender Normal Bowel Sounds Present. Musculoskeletal: Trace lower extremity edema bilaterally.   Psychiatry: Mood is appropriate for condition and setting.   Data  Reviewed: CBC: Recent Labs  Lab 02/27/19 1712 02/28/19 0207 03/01/19 0620 03/02/19 0500 03/03/19 0602  WBC 17.0* 13.6* 15.1* 12.1* 10.0  HGB 12.6* 12.0* 12.5* 12.1* 12.3*  HCT 39.6 37.1* 39.3 38.1* 39.0  MCV 92.1  88.8 91.8 90.5 91.3  PLT 367 390 470* 457* 0000000*   Basic Metabolic Panel: Recent Labs  Lab 02/27/19 1712 02/28/19 0207 03/01/19 0620 03/02/19 0500 03/03/19 0602  NA 133* 136 134* 137 138  K 4.6 4.0 4.4 4.2 4.4  CL 100 103 97* 101 103  CO2 20* 22 25 26 24   GLUCOSE 131* 126* 102* 102* 111*  BUN 14 9 12 14 14   CREATININE 0.69 0.60* 0.73 0.81 0.89  CALCIUM 8.7* 8.6* 8.8* 8.9 8.7*   GFR: Estimated Creatinine Clearance: 87.4 mL/min (by C-G formula based on SCr of 0.89 mg/dL). Liver Function Tests: No results for input(s): AST, ALT, ALKPHOS, BILITOT, PROT, ALBUMIN in the last 168 hours. No results for input(s): LIPASE, AMYLASE in the last 168 hours. No results for input(s): AMMONIA in the last 168 hours. Coagulation Profile: Recent Labs  Lab 03/02/19 2017  INR 1.1   Cardiac Enzymes: Recent Labs  Lab 02/28/19 0207  CKTOTAL 38*   BNP (last 3 results) No results for input(s): PROBNP in the last 8760 hours. HbA1C: No results for input(s): HGBA1C in the last 72 hours. CBG: Recent Labs  Lab 03/02/19 0743 03/02/19 1140 03/02/19 1626 03/02/19 2103 03/03/19 0740  GLUCAP 115* 120* 98 143* 118*   Lipid Profile: No results for input(s): CHOL, HDL, LDLCALC, TRIG, CHOLHDL, LDLDIRECT in the last 72 hours. Thyroid Function Tests: No results for input(s): TSH, T4TOTAL, FREET4, T3FREE, THYROIDAB in the last 72 hours. Anemia Panel: No results for input(s): VITAMINB12, FOLATE, FERRITIN, TIBC, IRON, RETICCTPCT in the last 72 hours. Urine analysis:    Component Value Date/Time   COLORURINE YELLOW 02/28/2019 2113   APPEARANCEUR HAZY (A) 02/28/2019 2113   LABSPEC 1.014 02/28/2019 2113   PHURINE 6.0 02/28/2019 2113   GLUCOSEU NEGATIVE 02/28/2019 2113   GLUCOSEU NEGATIVE 07/20/2018 0739   HGBUR SMALL (A) 02/28/2019 2113   BILIRUBINUR NEGATIVE 02/28/2019 2113   [place] NEGATIVE 02/28/2019 2113   PROTEINUR 30 (A) 02/28/2019 2113   UROBILINOGEN 1.0 07/20/2018 0739   NITRITE  POSITIVE (A) 02/28/2019 2113   LEUKOCYTESUR MODERATE (A) 02/28/2019 2113   Sepsis Labs: @LABRCNTIP (procalcitonin:4,lacticidven:4)  ) Recent Results (from the past 240 hour(s))  SARS CORONAVIRUS 2 (TAT 6-24 HRS) Nasopharyngeal Nasopharyngeal Swab     Status: None   Collection Time: 02/27/19  9:10 PM   Specimen: Nasopharyngeal Swab  Result Value Ref Range Status   SARS Coronavirus 2 NEGATIVE NEGATIVE Final    Comment: (NOTE) SARS-CoV-2 target nucleic acids are NOT DETECTED. The SARS-CoV-2 RNA is generally detectable in upper and lower respiratory specimens during the acute phase of infection. Negative results do not preclude SARS-CoV-2 infection, do not rule out co-infections with other pathogens, and should not be used as the sole basis for treatment or other patient management decisions. Negative results must be combined with clinical observations, patient history, and epidemiological information. The expected result is Negative. Fact Sheet for Patients: SugarRoll.be Fact Sheet for Healthcare Providers: https://www.woods-mathews.com/ This test is not yet approved or cleared by the [place] FDA and  has been authorized for detection and/or diagnosis of SARS-CoV-2 by FDA under an Emergency Use Authorization (EUA). This EUA will remain  in effect (meaning this test can be used) for the duration of the COVID-19 declaration  under Section 56 4(b)(1) of the Act, 21 U.S.C. section 360bbb-3(b)(1), unless the authorization is terminated or revoked sooner. Performed at [facility], [place] [address], [place], [place] [address]   Culture, blood (routine x 2)     Status: None (Preliminary result)   Collection Time: 02/28/19  2:07 AM   Specimen: BLOOD RIGHT HAND  Result Value Ref Range Status   Specimen Description BLOOD RIGHT HAND  Final   Special Requests   Final    BOTTLES DRAWN AEROBIC AND ANAEROBIC Blood Culture adequate volume    Culture   Final    NO GROWTH 2 DAYS Performed at [facility], [place] [address], [place], [place] [address]    Report Status PENDING  Incomplete  Culture, blood (routine x 2)     Status: None (Preliminary result)   Collection Time: 02/28/19  3:24 AM   Specimen: BLOOD RIGHT HAND  Result Value Ref Range Status   Specimen Description BLOOD RIGHT HAND  Final   Special Requests   Final    BOTTLES DRAWN AEROBIC ONLY Blood Culture adequate volume   Culture   Final    NO GROWTH 2 DAYS Performed at [facility], [place] [address], [place], [place] [address]    Report Status PENDING  Incomplete  Culture, Urine     Status: Abnormal   Collection Time: 02/28/19  7:05 PM   Specimen: Urine, Random  Result Value Ref Range Status   Specimen Description URINE, RANDOM  Final   Special Requests   Final    NONE Performed at [facility], [place] [address] [place], [place] [address]    Culture >=100,000 COLONIES/mL ENTEROBACTER AEROGENES (A)  Final   Report Status 03/02/2019 FINAL  Final   Organism ID, Bacteria ENTEROBACTER AEROGENES (A)  Final      Susceptibility   Enterobacter aerogenes - MIC*    CEFAZOLIN RESISTANT Resistant     CEFTRIAXONE <=1 SENSITIVE Sensitive     CIPROFLOXACIN <=0.25 SENSITIVE Sensitive     GENTAMICIN <=1 SENSITIVE Sensitive     IMIPENEM 1 SENSITIVE Sensitive     NITROFURANTOIN 64 INTERMEDIATE Intermediate     TRIMETH/SULFA <=20 SENSITIVE Sensitive     PIP/TAZO 16 SENSITIVE Sensitive     * >=100,000 COLONIES/mL ENTEROBACTER AEROGENES      Studies: No results found.  Scheduled Meds: . acidophilus  1 capsule Oral QHS  . apixaban  5 mg Oral BID  . Chlorhexidine Gluconate Cloth  6 each Topical Daily  . diltiazem  60 mg Oral Q6H  . influenza vaccine adjuvanted  0.5 mL Intramuscular Once  . insulin aspart  0-15 Units Subcutaneous TID WC  . loratadine  10 mg Oral Daily  . metoprolol tartrate  50 mg Oral TID    Continuous Infusions: . cefTRIAXone  (ROCEPHIN)  IV 1 g (03/02/19 1325)  . DAPTOmycin (CUBICIN)  IV 500 mg (02/28/19 2122)     LOS: 3 days     Kayleen Memos, MD [facility] Pager [phone]  If 7PM-7AM, please contact night-coverage [email or web address] Password [email or web address] 03/03/2019, 9:11 AM

## 2019-03-03 NOTE — Progress Notes (Signed)
Progress Note  Patient Name: Eric Santos Date of Encounter: 03/03/2019  Primary Cardiologist: [name], MD   Subjective   Tolerating Rocephin better NPO for cardioversion today   Inpatient Medications    Scheduled Meds:  acidophilus  1 capsule Oral QHS   apixaban  5 mg Oral BID   Chlorhexidine Gluconate Cloth  6 each Topical Daily   diltiazem  60 mg Oral Q6H   influenza vaccine adjuvanted  0.5 mL Intramuscular Once   insulin aspart  0-15 Units Subcutaneous TID WC   loratadine  10 mg Oral Daily   metoprolol tartrate  50 mg Oral TID   Continuous Infusions:  cefTRIAXone (ROCEPHIN)  IV 1 g (03/02/19 1325)   DAPTOmycin (CUBICIN)  IV 500 mg (02/28/19 2122)   PRN Meds: ondansetron (ZOFRAN) IV, sodium chloride flush, tiZANidine, traMADol   Vital Signs    Vitals:   03/02/19 1353 03/02/19 1712 03/02/19 2105 03/03/19 0422  BP: 100/79 99/83 101/81 109/81  Pulse: 84 85 80 86  Resp: 18  17 19   Temp: 97.9 F (36.6 C)  98 F (36.7 C) (!) 97.5 F (36.4 C)  TempSrc: Oral  Oral Oral  SpO2: 97%  100% 95%  Weight:      Height:        Intake/Output Summary (Last 24 hours) at 03/03/2019 0758 Last data filed at 03/03/2019 0430 Gross per 24 hour  Intake 1382 ml  Output 2495 ml  Net -1113 ml   Last 3 Weights 03/02/2019 03/01/2019 02/28/2019  Weight (lbs) 242 lb 15.2 oz 243 lb 1.6 oz 245 lb 3.2 oz  Weight (kg) 110.2 kg 110.269 kg 111.222 kg      Telemetry    Atrial fibrillation with rates in the 90s-100s overnight up to 130s-170s this morning- Personally Reviewed  ECG    No new tracings for review  Physical Exam   Affect appropriate Healthy:  appears stated age HEENT: normal Neck supple with no adenopathy JVP normal no bruits no thyromegaly Lungs clear with no wheezing and good diaphragmatic motion Heart:  S1/S2 no murmur, no rub, gallop or click PMI normal Abdomen: benighn, BS positve, no tenderness, no AAA no bruit.  No HSM or HJR Distal  pulses intact with no bruits No edema Neuro non-focal Skin warm and dry Right knee infection post TKR PIC line in RUE    Labs    High Sensitivity Troponin:   Recent Labs  Lab 02/27/19 1712 02/27/19 1838  TROPONINIHS 14 12      Chemistry Recent Labs  Lab 03/01/19 0620 03/02/19 0500 03/03/19 0602  NA 134* 137 138  K 4.4 4.2 4.4  CL 97* 101 103  CO2 25 26 24   GLUCOSE 102* 102* 111*  BUN 12 14 14   CREATININE 0.73 0.81 0.89  CALCIUM 8.8* 8.9 8.7*  GFRNONAA >60 >60 >60  GFRAA >60 >60 >60  ANIONGAP 12 10 11      Hematology Recent Labs  Lab 03/01/19 0620 03/02/19 0500 03/03/19 0602  WBC 15.1* 12.1* 10.0  RBC 4.28 4.21* 4.27  HGB 12.5* 12.1* 12.3*  HCT 39.3 38.1* 39.0  MCV 91.8 90.5 91.3  MCH 29.2 28.7 28.8  MCHC 31.8 31.8 31.5  RDW 13.4 13.4 13.4  PLT 470* 457* 489*    BNPNo results for input(s): BNP, PROBNP in the last 168 hours.   DDimer No results for input(s): DDIMER in the last 168 hours.   Radiology    ECHOCARDIOGRAM COMPLETE  Result Date: 03/01/2019  ECHOCARDIOGRAM REPORT   Patient Name:   Eric Santos Date of Exam: 03/01/2019 Medical Rec #:  [id number]      Height:       66.0 in Accession #:    [id number]     Weight:       243.1 lb Date of Birth:  1945-04-04     BSA:          2.17 m Patient Age:    73 years       BP:           101/77 mmHg Patient Gender: M              HR:           113 bpm. Exam Location:  Inpatient Procedure: 2D Echo, Color Doppler and Cardiac Doppler Indications:    Septic Arthritis  History:        Patient has prior history of Echocardiogram examinations, most                 recent 07/10/2017. Arrythmias:Atrial Fibrillation; Risk                 Factors:Sleep Apnea, Diabetes and Dyslipidemia.  Sonographer:    Raquel Sarna Senior RDCS Referring Phys: [phone] MIHAI CROITORU  Sonographer Comments: Technically difficult study due to poor echo windows. IMPRESSIONS  1. Left ventricular ejection fraction, by visual estimation, is 50 to 55%. The left  ventricle has low normal function. There is mildly increased left ventricular hypertrophy.  2. Left ventricular diastolic function could not be evaluated.  3. The left ventricle has no regional wall motion abnormalities.  4. Global right ventricle has normal systolic function.The right ventricular size is normal. No increase in right ventricular wall thickness.  5. Left atrial size was mildly dilated.  6. Right atrial size was normal.  7. The mitral valve was not well visualized. Trivial mitral valve regurgitation.  8. The tricuspid valve is not well visualized.  9. The aortic valve was not well visualized. Aortic valve regurgitation is trivial. Mild aortic valve sclerosis without stenosis. 10. The pulmonic valve was not well visualized. Pulmonic valve regurgitation is not visualized. 11. The inferior vena cava is normal in size with <50% respiratory variability, suggesting right atrial pressure of 8 mmHg. 12. The interatrial septum was not assessed. 13. Suboptimal windows, no obvious large valvular vegetations. Consider TEE if pretest probability for endocarditis is high. FINDINGS  Left Ventricle: Left ventricular ejection fraction, by visual estimation, is 50 to 55%. The left ventricle has low normal function. The left ventricle has no regional wall motion abnormalities. There is mildly increased left ventricular hypertrophy. The  left ventricular diastology could not be evaluated due to atrial fibrillation. Left ventricular diastolic function could not be evaluated. Right Ventricle: The right ventricular size is normal. No increase in right ventricular wall thickness. Global RV systolic function is has normal systolic function. Left Atrium: Left atrial size was mildly dilated. Right Atrium: Right atrial size was normal in size Pericardium: There is no evidence of pericardial effusion. Mitral Valve: The mitral valve was not well visualized. Trivial mitral valve regurgitation. Tricuspid Valve: The tricuspid valve  is not well visualized. Tricuspid valve regurgitation is trivial. Aortic Valve: The aortic valve was not well visualized. Aortic valve regurgitation is trivial. Mild aortic valve sclerosis is present, with no evidence of aortic valve stenosis. Pulmonic Valve: The pulmonic valve was not well visualized. Pulmonic valve regurgitation is not visualized. Pulmonic regurgitation is not visualized. Aorta:  The aortic root and ascending aorta are structurally normal, with no evidence of dilitation. Venous: The inferior vena cava is normal in size with less than 50% respiratory variability, suggesting right atrial pressure of 8 mmHg. IAS/Shunts: The interatrial septum was not assessed.  LEFT VENTRICLE PLAX 2D LVIDd:         4.46 cm LVIDs:         3.35 cm LV PW:         1.06 cm LV IVS:        1.17 cm LVOT diam:     2.00 cm LV SV:         45 ml LV SV Index:   19.31 LVOT Area:     3.14 cm  RIGHT VENTRICLE RV S prime:     15.70 cm/s TAPSE (M-mode): 1.7 cm LEFT ATRIUM             Index       RIGHT ATRIUM           Index LA diam:        4.30 cm 1.98 cm/m  RA Area:     18.60 cm LA Vol (A2C):   61.3 ml 28.21 ml/m RA Volume:   47.70 ml  21.95 ml/m LA Vol (A4C):   84.5 ml 38.89 ml/m LA Biplane Vol: 78.5 ml 36.13 ml/m  AORTIC VALVE LVOT Vmax:   59.10 cm/s LVOT Vmean:  40.900 cm/s LVOT VTI:    0.095 m  AORTA Ao Root diam: 2.90 cm Ao Asc diam:  3.70 cm  SHUNTS Systemic VTI:  0.10 m Systemic Diam: 2.00 cm  Lyman Bishop MD Electronically signed by Lyman Bishop MD Signature Date/Time: 03/01/2019/10:32:58 AM    Final     Cardiac Studies   LEFT HEART CATH AND CORONARY ANGIOGRAPHY 12/30/2017  Conclusion    Mid Cx to Dist Cx lesion is 30% stenosed.  Prox LAD lesion is 25% stenosed.  The left ventricular systolic function is normal.  LV end diastolic pressure is mildly elevated.  The left ventricular ejection fraction is 55-65% by visual estimate.   1. 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 Normal LV  systolic function with LVEF 55-65%    Echocardiogram 07/10/2017 Study Conclusion - Left ventricle: The cavity size was normal. Wall thickness was   increased in a pattern of mild LVH. Systolic function was normal.   The estimated ejection fraction was in the range of 55% to 60%. - Aortic valve: There was mild regurgitation. - Right ventricle: The cavity size was mildly dilated. Wall   thickness was normal.   Impressions: - No significant change compared to previous study.  Patient Profile     73 y.o. male  with a history of PAF, typical atrial flutter s/p ablation in 2017, nonobs CAD, PSVT, OSA on CPAP, DMII, and HL, who is being seen for the evaluation and treatment of Afib w/ RVR.  Assessment & Plan    Persistent atrial fibrillation with rapid ventricular response/paroxysmal atrial flutter Post flutter ablation in 2017.  Recurrent afib in setting of infection On Rx eliquis for [facility] today with Dr Debara Pickett   Chest pressure/nonobstructive CAD -Patient noted chest pressure in the setting of A. fib with RVR.  Patient had prolonged symptoms but troponins remained normal.  He was noted to have mild nonobstructive CAD by cath in 2019. -Continues on statin.  No aspirin in the setting of anticoagulation.  Diarrhea -improved C diff negative   Right prosthetic knee infection -  Diagnosed in November.  Patient was treated with outpatient IV antibiotics.  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  He has follow-up with infectious disease as an outpatient.  Hyperglycemia -Recently placed on Metformin.  A1c 6.3.  Hyperlipidemia -LDL was 58 in May, well controlled.  Continue current statin.    For questions or updates, please contact [place] Please consult [email or web address] for contact info under        Signed, [name], MD  03/03/2019, 7:58 AM

## 2019-03-03 NOTE — Anesthesia Preprocedure Evaluation (Addendum)
Anesthesia Evaluation  Patient identified by MRN, date of birth, ID band Patient awake    Reviewed: Allergy & Precautions, NPO status , Patient's Chart, lab work & pertinent test results  Airway Mallampati: III       Dental  (+) Teeth Intact   Pulmonary sleep apnea ,    breath sounds clear to auscultation       Cardiovascular + CAD  + dysrhythmias Atrial Fibrillation  Rhythm:Regular Rate:Normal     Neuro/Psych  Neuromuscular disease negative psych ROS   GI/Hepatic hiatal hernia, GERD  ,  Endo/Other  diabetes, Type 2, Oral Hypoglycemic Agents  Renal/GU      Musculoskeletal  (+) Arthritis ,   Abdominal Normal abdominal exam  (+)   Peds  Hematology negative hematology ROS (+)   Anesthesia Other Findings   Reproductive/Obstetrics                            Echo:  1. Left ventricular ejection fraction, by visual estimation, is 50 to 55%. The left ventricle has low normal function. There is mildly increased left ventricular hypertrophy.  2. Left ventricular diastolic function could not be evaluated.  3. The left ventricle has no regional wall motion abnormalities.  4. Global right ventricle has normal systolic function.The right ventricular size is normal. No increase in right ventricular wall thickness.  5. Left atrial size was mildly dilated.  6. Right atrial size was normal.  7. The mitral valve was not well visualized. Trivial mitral valve regurgitation.  8. The tricuspid valve is not well visualized.  9. The aortic valve was not well visualized. Aortic valve regurgitation is trivial. Mild aortic valve sclerosis without stenosis. 10. The pulmonic valve was not well visualized. Pulmonic valve regurgitation is not visualized. 11. The inferior vena cava is normal in size with <50% respiratory variability, suggesting right atrial pressure of 8 mmHg. 12. The interatrial septum was not  assessed. 13. Suboptimal windows, no obvious large valvular vegetations. Consider TEE if pretest probability for endocarditis is high.   Anesthesia Physical Anesthesia Plan  ASA: III  Anesthesia Plan: General   Post-op Pain Management:    Induction:   PONV Risk Score and Plan: 0 and Treatment may vary due to age or medical condition  Airway Management Planned: Simple Face Mask and Natural Airway  Additional Equipment: None  Intra-op Plan:   Post-operative Plan:   Informed Consent: I have reviewed the patients History and Physical, chart, labs and discussed the procedure including the risks, benefits and alternatives for the proposed anesthesia with the patient or authorized representative who has indicated his/her understanding and acceptance.       Plan Discussed with: CRNA  Anesthesia Plan Comments:        Anesthesia Quick Evaluation

## 2019-03-03 NOTE — H&P (Signed)
   INTERVAL PROCEDURE H&P  History and Physical Interval Note:  03/03/2019 11:01 AM  Eric Santos has presented today for their planned procedure. The various methods of treatment have been discussed with the patient and family. After consideration of risks, benefits and other options for treatment, the patient has consented to the procedure.  The patients' outpatient history has been reviewed, patient examined, and no change in status from most recent office note within the past 30 days. I have reviewed the patients' chart and labs and will proceed as planned. Questions were answered to the patient's satisfaction.   Eric Casino, MD, [facility], [place] Director of the [facility] Lipid Disorders &  Cardiovascular Risk Reduction Clinic Diplomate of the American Board of Clinical Lipidology Attending Cardiologist  Direct Dial: [phone]  Fax: [phone]  Website:  [email or web address] Osgood Keeven Matty 03/03/2019, 11:01 AM

## 2019-03-03 NOTE — Anesthesia Procedure Notes (Signed)
Procedure Name: General with mask airway Date/Time: 03/03/2019 11:13 AM Performed by: Janene Harvey, CRNA Pre-anesthesia Checklist: Patient identified, Emergency Drugs available, Suction available and Patient being monitored Oxygen Delivery Method: Ambu bag Dental Injury: Teeth and Oropharynx as per pre-operative assessment

## 2019-03-03 NOTE — CV Procedure (Signed)
   CARDIOVERSION NOTE  Procedure: Electrical Cardioversion Indications:  Atrial Fibrillation  Procedure Details:  Consent: Risks of procedure as well as the alternatives and risks of each were explained to the (patient/caregiver).  Consent for procedure obtained.  Time Out: Verified patient identification, verified procedure, site/side was marked, verified correct patient position, special equipment/implants available, medications/allergies/relevent history reviewed, required imaging and test results available.  Performed  Patient placed on cardiac monitor, pulse oximetry, supplemental oxygen as necessary.  Sedation given: propofol per anesthesia Pacer pads placed anterior and posterior chest.  Cardioverted 1 time(s).  Cardioverted at 150J biphasic.  Impression: Findings: Post procedure EKG shows: NSR Complications: None Patient did tolerate procedure well.  Plan: 1. Successful DCCV with a single 150J biphasic shock.  Time Spent Directly with the Patient:  30 minutes   Pixie Casino, MD, [facility], [place] Director of the Advanced Lipid Disorders &  Cardiovascular Risk Reduction Clinic Diplomate of the American Board of Clinical Lipidology Attending Cardiologist  Direct Dial: [phone]  Fax: [phone]  Website:  [email or web address]  Nadean Corwin Donyel Nester 03/03/2019, 11:22 AM

## 2019-03-04 DIAGNOSIS — Z95828 Presence of other vascular implants and grafts: Secondary | ICD-10-CM

## 2019-03-04 DIAGNOSIS — Z8619 Personal history of other infectious and parasitic diseases: Secondary | ICD-10-CM

## 2019-03-04 DIAGNOSIS — B9561 Methicillin susceptible Staphylococcus aureus infection as the cause of diseases classified elsewhere: Secondary | ICD-10-CM

## 2019-03-04 DIAGNOSIS — R112 Nausea with vomiting, unspecified: Secondary | ICD-10-CM

## 2019-03-04 DIAGNOSIS — Z1624 Resistance to multiple antibiotics: Secondary | ICD-10-CM

## 2019-03-04 DIAGNOSIS — Z8614 Personal history of Methicillin resistant Staphylococcus aureus infection: Secondary | ICD-10-CM

## 2019-03-04 DIAGNOSIS — Z89521 Acquired absence of right knee: Secondary | ICD-10-CM

## 2019-03-04 DIAGNOSIS — T8453XA Infection and inflammatory reaction due to internal right knee prosthesis, initial encounter: Secondary | ICD-10-CM

## 2019-03-04 DIAGNOSIS — M00061 Staphylococcal arthritis, right knee: Secondary | ICD-10-CM

## 2019-03-04 LAB — CBC
HCT: 37.1 % — ABNORMAL LOW (ref 39.0–52.0)
Hemoglobin: 11.8 g/dL — ABNORMAL LOW (ref 13.0–17.0)
MCH: 28.9 pg (ref 26.0–34.0)
MCHC: 31.8 g/dL (ref 30.0–36.0)
MCV: 90.9 fL (ref 80.0–100.0)
Platelets: 467 10*3/uL — ABNORMAL HIGH (ref 150–400)
RBC: 4.08 MIL/uL — ABNORMAL LOW (ref 4.22–5.81)
RDW: 13.4 % (ref 11.5–15.5)
WBC: 8.1 10*3/uL (ref 4.0–10.5)
nRBC: 0 % (ref 0.0–0.2)

## 2019-03-04 LAB — BASIC METABOLIC PANEL
Anion gap: 10 (ref 5–15)
BUN: 12 mg/dL (ref 8–23)
CO2: 25 mmol/L (ref 22–32)
Calcium: 8.8 mg/dL — ABNORMAL LOW (ref 8.9–10.3)
Chloride: 104 mmol/L (ref 98–111)
Creatinine, Ser: 0.77 mg/dL (ref 0.61–1.24)
GFR calc Af Amer: >60 mL/min (ref >60)
GFR calc non Af Amer: >60 mL/min (ref >60)
Glucose, Bld: 105 mg/dL — ABNORMAL HIGH (ref 70–99)
Potassium: 4.2 mmol/L (ref 3.5–5.1)
Sodium: 139 mmol/L (ref 135–145)

## 2019-03-04 LAB — GLUCOSE, CAPILLARY
Glucose-Capillary: 121 mg/dL — ABNORMAL HIGH (ref 70–99)
Glucose-Capillary: 94 mg/dL (ref 70–99)

## 2019-03-04 MED ORDER — MINOCYCLINE HCL 100 MG PO CAPS
100.0000 mg | ORAL_CAPSULE | Freq: Two times a day (BID) | ORAL | Status: DC
  Administered 2019-03-04: 100 mg via ORAL
  Filled 2019-03-04 (×2): qty 1

## 2019-03-04 MED ORDER — CEPHALEXIN 500 MG PO CAPS
500.0000 mg | ORAL_CAPSULE | Freq: Four times a day (QID) | ORAL | Status: DC
  Administered 2019-03-04: 13:00:00 500 mg via ORAL
  Filled 2019-03-04: qty 1

## 2019-03-04 MED ORDER — DILTIAZEM HCL 60 MG PO TABS: 60.0000 mg | ORAL_TABLET | Freq: Four times a day (QID) | ORAL | 0 refills | Status: DC

## 2019-03-04 MED ORDER — CEPHALEXIN 500 MG PO CAPS: 500.0000 mg | ORAL_CAPSULE | Freq: Four times a day (QID) | ORAL | Status: DC

## 2019-03-04 MED ORDER — MINOCYCLINE HCL 100 MG PO CAPS: 100.0000 mg | ORAL_CAPSULE | Freq: Two times a day (BID) | ORAL | 11 refills | Status: DC

## 2019-03-04 MED ORDER — METOPROLOL TARTRATE 50 MG PO TABS: 50.0000 mg | ORAL_TABLET | Freq: Three times a day (TID) | ORAL | 0 refills | Status: DC

## 2019-03-04 MED ORDER — SODIUM CHLORIDE 0.9 % IV SOLN
500.0000 mg | Freq: Every day | INTRAVENOUS | Status: DC
  Filled 2019-03-04: qty 10

## 2019-03-04 MED ORDER — CEFDINIR 300 MG PO CAPS
300.0000 mg | ORAL_CAPSULE | Freq: Two times a day (BID) | ORAL | Status: DC
  Filled 2019-03-04: qty 1

## 2019-03-04 MED ORDER — CEPHALEXIN 500 MG PO CAPS: 500.0000 mg | ORAL_CAPSULE | Freq: Four times a day (QID) | ORAL | 11 refills | Status: DC

## 2019-03-04 MED ORDER — SODIUM CHLORIDE 0.9 % IV SOLN
1.0000 g | INTRAVENOUS | Status: DC
  Filled 2019-03-04: qty 10

## 2019-03-04 NOTE — Progress Notes (Signed)
PROGRESS NOTE  CINCH RIPPLE [id number] DOB: 06/12/1945 DOA: 02/27/2019 PCP: Biagio Borg, MD  HPI/Recap of past 24 hours: Patient is a 73 year old Caucasian male, morbidly obese, with past medical history significant for Paroxysmal atrial fibrillation, SVT, typical atrial flutter status post ablation in 2017, OSA on bipap, nonobstructive coronary artery disease and Type 2 diabetes.  Patient was admitted with nausea, vomiting, diarrhea, atrial fibrillation with rapid ventricular response and chest pressures.  GI symptoms are resolved significantly.  Heart rate has improved to the 80s.  Chest pressure has resolved.  TTE pending, in the setting of recent R prosthetic knee septic arthritis. R knee fluid 01/20/19 grew multidrug-resistant staph epidermis.  [place] completed 03/03/19 successfully by Dr. Debara Pickett.  03/04/19: Seen and examined.  Feels well.  No new complaints.  He is back in sinus rhythm since yesterday.  Twelve-lead EKG done and confirmed.  States he will no longer take his IV antibiotics for his right prosthetic knee septic arthritis.  Requested oral antibiotics.  Consulted infectious disease Dr. Tommy Medal who will see in consultation prior to discharge.  Assessment/Plan: Principal Problem:   Atrial fibrillation with RVR (HCC) Active Problems:   Obstructive sleep apnea   Infection of prosthetic right knee joint (HCC)   Type 2 diabetes mellitus with hyperlipidemia (HCC)  Paroxysmal atrial fibrillation with RVR post successful cardioversion on 03/03/2019 by Dr. Debara Pickett. Back in sinus rhythm since 03/03/2019 post cardioversion.  Confirmed on twelve-lead EKG. Currently on Lopressor p.o. 50 mg 3 times daily and p.o. diltiazem 60 mg 4 times daily. 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 Patient will no longer take IV  daptomycin and request for antibiotic Infectious disease consulted for recommendations and will see in consultation. Likely will need 6 months of antibiotics Currently afebrile with no leukocytosis.  Enterobacter aerogenes UTI UA done last night + pyuria Urine cx grew greater than 100,000 Enterobacter aerogenes resistant to cefazolin and sensitive to Rocephin, Bactrim. Continue rocephin 03/01/19>>  Improving GI symptoms/diarrhea GI symptoms have resolved.  Type 2 diabetes, well controlled Hemoglobin A1c 6.3 on 02/28/2019 Continue insulin sliding scale Avoid hypoglycemia  Hyperlipidemia Continue statin  OSA Continue Bipapnightly  Physical debility PT OT assessed no PT follow-up Continue fall precautions on NOAC.   DVT prophylaxis: Eliquis Code Status: DNR Family Communication:  Updated patient and spouse at bedside.   Disposition Plan: Home once cardiology signs off.   Consultants:   Cardiology  Infectious disease 03/04/2019.  Procedures:  Echocardiogram  Antimicrobials:   IV daptomycin  IV Rocephin 12/28>>>     Objective: Vitals:   03/03/19 2242 03/04/19 0456 03/04/19 0613 03/04/19 0827  BP: 123/84 113/86 115/80 121/88  Pulse: 80 74  95  Resp:      Temp:  98.1 F (36.7 C)    TempSrc:  Oral    SpO2:  97%    Weight:  110.9 kg    Height:        Intake/Output Summary (Last 24 hours) at 03/04/2019 [id number] Last data filed at 03/04/2019 0501 Gross per 24 hour  Intake 760 ml  Output 1940 ml  Net -1180 ml   Filed Weights   03/01/19 0526 03/02/19 0447 03/04/19 0456  Weight: 110.3 kg 110.2 kg 110.9 kg    Exam:  . General: 73 y.o. year-old male well-developed well-nourished in no acute distress.  Alert and oriented x3.   Marland Kitchen  Cardiovascular: Irregular rate and rhythm no rubs or gallops. Marland Kitchen Respiratory: Clear to auscultation no wheezing or rales.   Abdomen: Soft nontender normal bowel sounds present.   Musculoskeletal: Trace lower  extremity edema bilaterally. Psychiatry: Mood is appropriate for condition and setting.   Data Reviewed: CBC: Recent Labs  Lab 02/28/19 0207 03/01/19 0620 03/02/19 0500 03/03/19 0602 03/04/19 0552  WBC 13.6* 15.1* 12.1* 10.0 8.1  HGB 12.0* 12.5* 12.1* 12.3* 11.8*  HCT 37.1* 39.3 38.1* 39.0 37.1*  MCV 88.8 91.8 90.5 91.3 90.9  PLT 390 470* 457* 489* 0000000*   Basic Metabolic Panel: Recent Labs  Lab 02/28/19 0207 03/01/19 0620 03/02/19 0500 03/03/19 0602 03/04/19 0552  NA 136 134* 137 138 139  K 4.0 4.4 4.2 4.4 4.2  CL 103 97* 101 103 104  CO2 22 25 26 24 25   GLUCOSE 126* 102* 102* 111* 105*  BUN 9 12 14 14 12   CREATININE 0.60* 0.73 0.81 0.89 0.77  CALCIUM 8.6* 8.8* 8.9 8.7* 8.8*   GFR: Estimated Creatinine Clearance: 97.5 mL/min (by C-G formula based on SCr of 0.77 mg/dL). Liver Function Tests: No results for input(s): AST, ALT, ALKPHOS, BILITOT, PROT, ALBUMIN in the last 168 hours. No results for input(s): LIPASE, AMYLASE in the last 168 hours. No results for input(s): AMMONIA in the last 168 hours. Coagulation Profile: Recent Labs  Lab 03/02/19 2017  INR 1.1   Cardiac Enzymes: Recent Labs  Lab 02/28/19 0207  CKTOTAL 38*   BNP (last 3 results) No results for input(s): PROBNP in the last 8760 hours. HbA1C: No results for input(s): HGBA1C in the last 72 hours. CBG: Recent Labs  Lab 03/03/19 0740 03/03/19 1247 03/03/19 1615 03/03/19 2046 03/04/19 0732  GLUCAP 118* 97 164* 87 121*   Lipid Profile: No results for input(s): CHOL, HDL, LDLCALC, TRIG, CHOLHDL, LDLDIRECT in the last 72 hours. Thyroid Function Tests: No results for input(s): TSH, T4TOTAL, FREET4, T3FREE, THYROIDAB in the last 72 hours. Anemia Panel: No results for input(s): VITAMINB12, FOLATE, FERRITIN, TIBC, IRON, RETICCTPCT in the last 72 hours. Urine analysis:    Component Value Date/Time   COLORURINE YELLOW 02/28/2019 2113   APPEARANCEUR HAZY (A) 02/28/2019 2113   LABSPEC 1.014  02/28/2019 2113   PHURINE 6.0 02/28/2019 2113   GLUCOSEU NEGATIVE 02/28/2019 2113   GLUCOSEU NEGATIVE 07/20/2018 0739   HGBUR SMALL (A) 02/28/2019 2113   BILIRUBINUR NEGATIVE 02/28/2019 2113   [place] NEGATIVE 02/28/2019 2113   PROTEINUR 30 (A) 02/28/2019 2113   UROBILINOGEN 1.0 07/20/2018 0739   NITRITE POSITIVE (A) 02/28/2019 2113   LEUKOCYTESUR MODERATE (A) 02/28/2019 2113   Sepsis Labs: @LABRCNTIP (procalcitonin:4,lacticidven:4)  ) Recent Results (from the past 240 hour(s))  SARS CORONAVIRUS 2 (TAT 6-24 HRS) Nasopharyngeal Nasopharyngeal Swab     Status: None   Collection Time: 02/27/19  9:10 PM   Specimen: Nasopharyngeal Swab  Result Value Ref Range Status   SARS Coronavirus 2 NEGATIVE NEGATIVE Final    Comment: (NOTE) SARS-CoV-2 target nucleic acids are NOT DETECTED. The SARS-CoV-2 RNA is generally detectable in upper and lower respiratory specimens during the acute phase of infection. Negative results do not preclude SARS-CoV-2 infection, do not rule out co-infections with other pathogens, and should not be used as the sole basis for treatment or other patient management decisions. Negative results must be combined with clinical observations, patient history, and epidemiological information. The expected result is Negative. Fact Sheet for Patients: SugarRoll.be Fact Sheet for Healthcare Providers: https://www.woods-mathews.com/ This test is not yet approved  or cleared by the [place] and  has been authorized for detection and/or diagnosis of SARS-CoV-2 by FDA under an Emergency Use Authorization (EUA). This EUA will remain  in effect (meaning this test can be used) for the duration of the COVID-19 declaration under Section 56 4(b)(1) of the Act, 21 U.S.C. section 360bbb-3(b)(1), unless the authorization is terminated or revoked sooner. Performed at [facility], [place] [address], [place], [place] [address]     Culture, blood (routine x 2)     Status: None (Preliminary result)   Collection Time: 02/28/19  2:07 AM   Specimen: BLOOD RIGHT HAND  Result Value Ref Range Status   Specimen Description BLOOD RIGHT HAND  Final   Special Requests   Final    BOTTLES DRAWN AEROBIC AND ANAEROBIC Blood Culture adequate volume   Culture   Final    NO GROWTH 3 DAYS Performed at [facility], [place] [address], [place], [place] [address]    Report Status PENDING  Incomplete  Culture, blood (routine x 2)     Status: None (Preliminary result)   Collection Time: 02/28/19  3:24 AM   Specimen: BLOOD RIGHT HAND  Result Value Ref Range Status   Specimen Description BLOOD RIGHT HAND  Final   Special Requests   Final    BOTTLES DRAWN AEROBIC ONLY Blood Culture adequate volume   Culture   Final    NO GROWTH 3 DAYS Performed at [facility], [place] [address] [place], [place] [address]    Report Status PENDING  Incomplete  Culture, Urine     Status: Abnormal   Collection Time: 02/28/19  7:05 PM   Specimen: Urine, Random  Result Value Ref Range Status   Specimen Description URINE, RANDOM  Final   Special Requests   Final    NONE Performed at [facility], [place] [address], [place],  [address]    Culture >=100,000 COLONIES/mL ENTEROBACTER AEROGENES (A)  Final   Report Status 03/02/2019 FINAL  Final   Organism ID, Bacteria ENTEROBACTER AEROGENES (A)  Final      Susceptibility   Enterobacter aerogenes - MIC*    CEFAZOLIN RESISTANT Resistant     CEFTRIAXONE <=1 SENSITIVE Sensitive     CIPROFLOXACIN <=0.25 SENSITIVE Sensitive     GENTAMICIN <=1 SENSITIVE Sensitive     IMIPENEM 1 SENSITIVE Sensitive     NITROFURANTOIN 64 INTERMEDIATE Intermediate     TRIMETH/SULFA <=20 SENSITIVE Sensitive     PIP/TAZO 16 SENSITIVE Sensitive     * >=100,000 COLONIES/mL ENTEROBACTER AEROGENES      Studies: No results found.  Scheduled Meds: . acidophilus  1 capsule Oral QHS  . apixaban  5 mg Oral BID   . Chlorhexidine Gluconate Cloth  6 each Topical Daily  . diltiazem  60 mg Oral Q6H  . insulin aspart  0-15 Units Subcutaneous TID WC  . loratadine  10 mg Oral Daily  . metoprolol tartrate  50 mg Oral TID    Continuous Infusions: . cefTRIAXone (ROCEPHIN)  IV    . DAPTOmycin (CUBICIN)  IV       LOS: 4 days     Kayleen Memos, MD [facility] Pager [phone]  If 7PM-7AM, please contact night-coverage [email or web address] Password [email or web address] 03/04/2019, 9:28 AM

## 2019-03-04 NOTE — TOC Initial Note (Signed)
Transition of Care [facility]) - Initial/Assessment Note    Patient Details  Name: Eric Santos MRN: [id number] Date of Birth: December 06, 1945  Transition of Care [facility] CM/SW Contact:    Bethena Roys, RN Phone Number: 03/04/2019, 1:38 PM  Clinical Narrative: Patient was previously active with [facility]- plan to return home with services. Case Manager did reach out to Liaison for [facility] and is aware that patient will transition home today. Patient has transportation. No further needs from Case Manager at this time.                   Expected Discharge Plan: [place] Barriers to Discharge: No Barriers Identified   Patient Goals and CMS Choice Patient states their goals for this hospitalization and ongoing recovery are:: "to return home"      Expected Discharge Plan and Services Expected Discharge Plan: [place] In-house Referral: NA Discharge Planning Services: CM Consult Post Acute Care Choice: Home Health, Resumption of Svcs/PTA Provider Living arrangements for the past 2 months: Single Family Home Expected Discharge Date: 03/04/19                         HH Arranged: RN, PT HH Agency: [facility] Date [place]: 03/04/19 Time [place]: [id number] Representative spoke with at [place] Arrangements/Services Living arrangements for the past 2 months: [place] with:: Spouse Patient language and need for interpreter reviewed:: Yes Do you feel safe going back to the place where you live?: Yes      Need for Family Participation in Patient Care: Yes (Comment) Care giver support system in place?: Yes (comment)   Criminal Activity/Legal Involvement Pertinent to Current Situation/Hospitalization: No - Comment as needed  Activities of Daily Living Home Assistive Devices/Equipment: CBG Meter ADL Screening (condition at time of admission) Patient's  cognitive ability adequate to safely complete daily activities?: Yes Is the patient deaf or have difficulty hearing?: No Does the patient have difficulty seeing, even when wearing glasses/contacts?: No Does the patient have difficulty concentrating, remembering, or making decisions?: No Patient able to express need for assistance with ADLs?: Yes Does the patient have difficulty dressing or bathing?: No Independently performs ADLs?: Yes (appropriate for developmental age) Does the patient have difficulty walking or climbing stairs?: No Weakness of Legs: None Weakness of Arms/Hands: None  Permission Sought/Granted Permission sought to share information with : Facility Sport and exercise psychologist, Family Supports       Permission granted to share info w AGENCY: [facility]        Emotional Assessment Appearance:: Appears stated age Attitude/Demeanor/Rapport: Engaged Affect (typically observed): Appropriate Orientation: : Oriented to Situation, Oriented to Place, Oriented to Self, Oriented to  Time Alcohol / Substance Use: Not Applicable Psych Involvement: No (comment)  Admission diagnosis:  Atrial fibrillation with RVR ([place]) [I48.91] Patient Active Problem List   Diagnosis Date Noted  . Atrial fibrillation with RVR ([place]) 02/27/2019  . Type 2 diabetes mellitus with hyperlipidemia ([place]) 02/27/2019  . Infection of total right knee replacement ([place]) 01/20/2019  . Infection of prosthetic right knee joint ([place]) 01/19/2019  . Septic arthritis of knee, right ([place]) 01/19/2019  . Left knee pain 01/22/2018  . Left flank pain 01/14/2017  . Right foot pain 07/11/2016  . Varicose veins of leg with swelling, right 07/11/2016  . SVT (supraventricular tachycardia) ([place]) 09/01/2015  . Low back pain  06/09/2015  . Obesity, Class III, BMI 40-49.9 (morbid obesity) ([place]) 05/01/2015  . Atrial flutter ([place]) 04/13/2015  . Palpitations 03/08/2015  . Left groin pain 01/06/2014  . Squamous cell  carcinoma, 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  . Heart palpitations 10/26/2012  . Carpal tunnel syndrome of right wrist 10/26/2012  . 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 Basal cell carcinoma of brow 10/15/2011  . Preop exam for internal medicine 04/16/2011  . External hemorrhoid 10/10/2010  . Bladder neck obstruction 10/10/2010  . Shingles 08/29/2010  . Preventative health care 08/29/2010  . Other specified forms of hearing loss 10/03/2009  . 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 Fatigue 10/03/2009  . Impaired glucose tolerance 01/18/2008  . Hyperlipidemia 01/18/2008  . Obstructive sleep apnea 01/18/2008  . Allergic rhinitis 01/18/2008  . GERD 01/18/2008  . DIVERTICULOSIS, COLON 01/18/2008  . NEPHROLITHIASIS, HX OF 01/18/2008   PCP:  Biagio Borg, MD Pharmacy:   [facility] [phone] [place], [place] AT [place] [id number] [place] [address] Phone: [phone] Fax: [phone]  [place], [place] [facility] [address] [place] [address] [place] [address] Phone: [phone] Fax: [phone]     Social Determinants of Health (SDOH) Interventions    Readmission Risk Interventions No flowsheet data found.

## 2019-03-04 NOTE — Telephone Encounter (Signed)
Returned call to wife and apologized for the delay and explained I was out of the office unexpectedly. Informed that Dr. Curt Bears was agreeable to holding off if they felt he was in rhythm and doing ok.  Wife informs me that he had DCCV yesterday d/t AFib issues day after Christmas. Understands to keep appt next month and one in February for close monitoring post DCCV.  Advised to call office if he has any AFib related issues.  Wife verbalized understanding and agreeable to plan.

## 2019-03-04 NOTE — Progress Notes (Addendum)
Progress Note  Patient Name: Eric Santos Date of Encounter: 03/04/2019  Primary Cardiologist: [name], MD   Subjective   Anxious to go home his insurance changes tomorrow and would mean more OOP expense  Inpatient Medications    Scheduled Meds: . acidophilus  1 capsule Oral QHS  . apixaban  5 mg Oral BID  . cefdinir  300 mg Oral Q12H  . Chlorhexidine Gluconate Cloth  6 each Topical Daily  . diltiazem  60 mg Oral Q6H  . insulin aspart  0-15 Units Subcutaneous TID WC  . loratadine  10 mg Oral Daily  . metoprolol tartrate  50 mg Oral TID   Continuous Infusions: . DAPTOmycin (CUBICIN)  IV 500 mg (02/28/19 2122)   PRN Meds: ondansetron (ZOFRAN) IV, sodium chloride flush, tiZANidine, traMADol   Vital Signs    Vitals:   03/03/19 2242 03/04/19 0456 03/04/19 0613 03/04/19 0827  BP: 123/84 113/86 115/80 121/88  Pulse: 80 74  95  Resp:      Temp:  98.1 F (36.7 C)    TempSrc:  Oral    SpO2:  97%    Weight:  110.9 kg    Height:        Intake/Output Summary (Last 24 hours) at 03/04/2019 0849 Last data filed at 03/04/2019 0501 Gross per 24 hour  Intake 880 ml  Output 1940 ml  Net -1060 ml   Last 3 Weights 03/04/2019 03/02/2019 03/01/2019  Weight (lbs) 244 lb 6.4 oz 242 lb 15.2 oz 243 lb 1.6 oz  Weight (kg) 110.859 kg 110.2 kg 110.269 kg      Telemetry    Atrial fibrillation with rates in the 90s-100s overnight up to 130s-170s this morning- Personally Reviewed  ECG    No new tracings for review  Physical Exam   Affect appropriate Healthy:  appears stated age HEENT: normal Neck supple with no adenopathy JVP normal no bruits no thyromegaly Lungs clear with no wheezing and good diaphragmatic motion Heart:  S1/S2 no murmur, no rub, gallop or click PMI normal Abdomen: benighn, BS positve, no tenderness, no AAA no bruit.  No HSM or HJR Distal pulses intact with no bruits No edema Neuro non-focal Skin warm and dry Right knee infection post  TKR PIC line in RUE    Labs    High Sensitivity Troponin:   Recent Labs  Lab 02/27/19 1712 02/27/19 1838  TROPONINIHS 14 12      Chemistry Recent Labs  Lab 03/02/19 0500 03/03/19 0602 03/04/19 0552  NA 137 138 139  K 4.2 4.4 4.2  CL 101 103 104  CO2 26 24 25   GLUCOSE 102* 111* 105*  BUN 14 14 12   CREATININE 0.81 0.89 0.77  CALCIUM 8.9 8.7* 8.8*  GFRNONAA >60 >60 >60  GFRAA >60 >60 >60  ANIONGAP 10 11 10      Hematology Recent Labs  Lab 03/02/19 0500 03/03/19 0602 03/04/19 0552  WBC 12.1* 10.0 8.1  RBC 4.21* 4.27 4.08*  HGB 12.1* 12.3* 11.8*  HCT 38.1* 39.0 37.1*  MCV 90.5 91.3 90.9  MCH 28.7 28.8 28.9  MCHC 31.8 31.5 31.8  RDW 13.4 13.4 13.4  PLT 457* 489* 467*    BNPNo results for input(s): BNP, PROBNP in the last 168 hours.   DDimer No results for input(s): DDIMER in the last 168 hours.   Radiology    No results found.  Cardiac Studies   LEFT HEART CATH AND CORONARY ANGIOGRAPHY 12/30/2017  Conclusion  Mid Cx to Dist Cx lesion is 30% stenosed.  Prox LAD lesion is 25% stenosed.  The left ventricular systolic function is normal.  LV end diastolic pressure is mildly elevated.  The left ventricular ejection fraction is 55-65% by visual estimate.   1. 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 Normal LV systolic function with LVEF 55-65%    Echocardiogram 07/10/2017 Study Conclusion - Left ventricle: The cavity size was normal. Wall thickness was   increased in a pattern of mild LVH. Systolic function was normal.   The estimated ejection fraction was in the range of 55% to 60%. - Aortic valve: There was mild regurgitation. - Right ventricle: The cavity size was mildly dilated. Wall   thickness was normal.   Impressions: - No significant change compared to previous study.  Patient Profile     73 y.o. male  with a history of PAF, typical atrial flutter s/p ablation in 2017, nonobs CAD, PSVT, OSA on CPAP, DMII, and HL,  who is being seen for the evaluation and treatment of Afib w/ RVR.  Assessment & Plan    Persistent atrial fibrillation with rapid ventricular response/paroxysmal atrial flutter Post flutter ablation in 2017.  [facility] 12/30 maintaining NSR continue anticoagulation   Chest pressure/nonobstructive CAD -Patient noted chest pressure in the setting of A. fib with RVR.  Patient had prolonged symptoms but troponins remained normal.  He was noted to have mild nonobstructive CAD by cath in 2019. -Continues on statin.  No aspirin in the setting of anticoagulation.  Diarrhea -improved C diff negative   Right prosthetic knee infection -Diagnosed in November.  Patient was treated with outpatient IV antibiotics.  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  He has follow-up with infectious disease as an outpatient.  Hyperglycemia -Recently placed on Metformin.  A1c 6.3.  Hyperlipidemia -LDL was 58 in May, well controlled.  Continue current statin.  Need to have ID/Hospitalist figure out what oral or iv antibiotics to go home on Should be able to be d/c today Will arrange outpatient f/u with afib clinic in 2 weeks   Cardiology to sign off   For questions or updates, please contact [place] Please consult [email or web address] for contact info under        Signed, [name], MD  03/04/2019, 8:49 AM

## 2019-03-04 NOTE — Discharge Summary (Signed)
Discharge Summary  Eric Santos [id number] DOB: June 17, 1945  PCP: Biagio Borg, MD  Admit date: 02/27/2019 Discharge date: 03/04/2019  Time spent: 35 minutes  Recommendations for Outpatient Follow-up:  1. Follow-up with cardiology 2. Follow-up with infectious disease 3. Follow-up with primary care provider 4. Take your medications as prescribed 5. Fall precautions.  Discharge Diagnoses:  Active Hospital Problems   Diagnosis Date Noted  . Atrial fibrillation with RVR ([place]) 02/27/2019  . Type 2 diabetes mellitus with hyperlipidemia ([place]) 02/27/2019  . Infection of prosthetic right knee joint ([place]) 01/19/2019  . Obstructive sleep apnea 01/18/2008    Resolved Hospital Problems  No resolved problems to display.    Discharge Condition: Stable  Diet recommendation: Heart healthy carb modified diet.  Vitals:   03/04/19 0827 03/04/19 1202  BP: 121/88 112/85  Pulse: 95   Resp:    Temp:    SpO2:      History of present illness:  Patient is a 73 year old Caucasian male, morbidly obese, with past medical history significant forParoxysmal atrial fibrillation, SVT, typical atrial flutter status post ablation in 2017, OSA on bipap, nonobstructive coronary artery disease and Type 2 diabetes. Patient was admitted with nausea, vomiting, diarrhea, atrial fibrillation with rapid ventricular response and chest pressures. GI symptoms are resolved significantly. Heart rate has improved to the 80s. Chest pressure has resolved. TTE pending, in the setting of recent R prosthetic knee septic arthritis. R knee fluid 01/20/19 grew multidrug-resistant staph epidermis.  [place] completed 03/03/19 successfully by Dr. Debara Pickett.  03/04/19: Seen and examined.  Feels well.  No new complaints.  He is back in sinus rhythm since yesterday.  Twelve-lead EKG done and confirmed.  States he will no longer take his IV antibiotics for his right prosthetic knee septic arthritis.  Requested oral antibiotics.   Consulted infectious disease Dr. Tommy Medal who will see in consultation prior to discharge.  Seen by infectious disease with recommendation for minocycline 100 mg twice daily x12 months and Keflex 500 mg 4 times daily for 12 months.  Discussed UTI p.o. antibiotics recommendation with infectious disease Dr. Tommy Medal who recommended no further coverage.  Completed 3 days of IV Rocephin.  Patient will follow up with infectious disease outpatient.  Patient advised to keep his appointment with infectious disease as scheduled.  Updated his wife Vira Agar via phone and advised the same.  Patient and wife understand and agree to plan.    Hospital Course:  Principal Problem:   Atrial fibrillation with RVR (HCC) Active Problems:   Obstructive sleep apnea   Infection of prosthetic right knee joint (HCC)   Type 2 diabetes mellitus with hyperlipidemia ([facility])  Paroxysmal atrial fibrillation with RVR post successful cardioversion on 03/03/2019 by Dr. Debara Pickett. Back in sinus rhythm since 03/03/2019 post cardioversion.  Confirmed on twelve-lead EKG. Currently on Lopressor p.o. 50 mg 3 times daily and p.o. diltiazem 60 mg 4 times daily. 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 Patient states he will no longer take IV daptomycin and requests po antibiotic Infectious disease consulted for recommendations and will see in consultation. Currently afebrile with no leukocytosis. Seen by infectious disease with recommendation for minocycline 100 mg twice daily x12 months and Keflex 500 mg 4 times daily for 12 months.  Discussed UTI p.o. antibiotics recommendation with infectious disease Dr. Tommy Medal who recommended no further  coverage.  Completed 3 days of IV Rocephin.  Patient will follow up with infectious disease  outpatient. Patient advised to keep his appointment with infectious disease as scheduled.  Updated his wife Vira Agar via phone and advised the same.  Patient and wife understand and agree to plan. Follow-up with infectious disease within a week  Enterobacter aerogenes UTI UA done last night + pyuria Urine cx grew greater than 100,000 Enterobacter aerogenes resistant to cefazolin and sensitive to Rocephin, Bactrim. Discussed with infectious disease who recommended no further coverage. Completed rocephin 03/01/19>> 03/04/2019. Follow-up with infectious disease  Improving GI symptoms/diarrhea GI symptoms have resolved.  Type 2 diabetes, well controlled Hemoglobin A1c 6.3 on 02/28/2019 Resume Metformin  Hyperlipidemia Continue statin  OSA Continue Bipapnightly  Physical debility PT OT assessed no PT follow-up Continue fall precautions at home while on Eliquis.    Code Status:DNR  Family Communication:  Updated his wife Vira Agar via phone on 03/04/2019.     Consultants:  Cardiology  Infectious disease 03/04/2019.  Procedures: Echocardiogram  Antimicrobials:  IV daptomycin 01/23/2019>> 03/04/2019.  IV Rocephin 12/28>>> 03/04/2019.  Discharge Exam: BP 112/85   Pulse 95   Temp 98.1 F (36.7 C) (Oral)   Resp 19   Ht 5\' 6"  (1.676 m)   Wt 110.9 kg   SpO2 97%   BMI 39.45 kg/m  . General: 73 y.o. year-old male pleasant, well developed well nourished in no acute distress.  Alert and oriented x3. . Cardiovascular: Regular rate and rhythm with no rubs or gallops.  No thyromegaly or JVD noted.  Marland Kitchen Respiratory: Clear to auscultation with no wheezes or rales. Good inspiratory effort. . Abdomen: Soft nontender nondistended with normal bowel sounds x4 quadrants. . Musculoskeletal: Trace lower extremity edema bilaterally. Marland Kitchen Psychiatry: Mood is appropriate for condition and setting  Discharge Instructions You were cared for by a hospitalist during your  hospital stay. If you have any questions about your discharge medications or the care you received while you were in the hospital after you are discharged, you can call the unit and asked to speak with the hospitalist on call if the hospitalist that took care of you is not available. Once you are discharged, your primary care physician will handle any further medical issues. Please note that NO REFILLS for any discharge medications will be authorized once you are discharged, as it is imperative that you return to your primary care physician (or establish a relationship with a primary care physician if you do not have one) for your aftercare needs so that they can reassess your need for medications and monitor your lab values.  Discharge Instructions    Home infusion instructions   Complete by: As directed    Instructions: Flushing of vascular access device: 0.9% NaCl pre/post medication administration and prn patency; Heparin 100 u/ml, 80ml for implanted ports and Heparin 10u/ml, 77ml for all other central venous catheters.     Allergies as of 03/04/2019      Reactions   Adhesive [tape] Other (See Comments)   "Peels off skin"      Medication List    STOP taking these medications   daptomycin  IVPB Commonly known as: CUBICIN   DAPTOmycin 500 MG injection Commonly known as: CUBICIN   flecainide 100 MG tablet Commonly known as: TAMBOCOR   flecainide 150 MG tablet Commonly known as: TAMBOCOR   oxyCODONE-acetaminophen 5-325 MG tablet Commonly known as: PERCOCET/ROXICET   tamsulosin 0.4 MG Caps capsule Commonly known as: FLOMAX   triamcinolone  55 MCG/ACT Aero nasal inhaler Commonly known as: NASACORT     TAKE these medications   acetaminophen 500 MG tablet Commonly known as: TYLENOL Take 1,000 mg by mouth every 6 (six) hours as needed for mild pain or headache.   Acidophilus Tabs Take 1 tablet by mouth at bedtime.   apixaban 5 MG Tabs tablet Commonly known as: ELIQUIS Take 1  tablet (5 mg total) by mouth 2 (two) times daily.   Centrum Silver 50+Men Tabs Take 1 tablet by mouth daily.   cephALEXin 500 MG capsule Commonly known as: KEFLEX Take 1 capsule (500 mg total) by mouth every 6 (six) hours.   cetirizine 10 MG tablet Commonly known as: ZYRTEC Take 10 mg by mouth daily.   diclofenac sodium 1 % Gel Commonly known as: VOLTAREN Apply 2 g topically 4 (four) times daily as needed. What changed: reasons to take this   diltiazem 60 MG tablet Commonly known as: CARDIZEM Take 1 tablet (60 mg total) by mouth every 6 (six) hours.   fluticasone 50 MCG/ACT nasal spray Commonly known as: FLONASE Place 1 spray into both nostrils daily.   metFORMIN 500 MG tablet Commonly known as: GLUCOPHAGE Take 1 tablet (500 mg total) by mouth 2 (two) times daily as needed (sugar > 180).   metoprolol tartrate 50 MG tablet Commonly known as: LOPRESSOR Take 1 tablet (50 mg total) by mouth 3 (three) times daily. What changed: when to take this   minocycline 100 MG capsule Commonly known as: MINOCIN Take 1 capsule (100 mg total) by mouth 2 (two) times daily.   nystatin cream Commonly known as: MYCOSTATIN Apply 1 application topically 2 (two) times daily as needed (as directed- to affected areas of the face).   OneTouch Ultra test strip Generic drug: glucose blood Use as instructed four times daily E11.9   rosuvastatin 10 MG tablet Commonly known as: CRESTOR TAKE 1 TABLET BY MOUTH  DAILY   tiZANidine 2 MG tablet Commonly known as: ZANAFLEX Take 1 tablet (2 mg total) by mouth every 6 (six) hours as needed. What changed: reasons to take this   traMADol 50 MG tablet Commonly known as: ULTRAM Take 1 tablet (50 mg total) by mouth every 6 (six) hours as needed (moderate pain or spasms). Muscle spasms   VITAMIN B-12 PO Take 1 tablet by mouth at bedtime.            Home Infusion Instuctions  (From admission, onward)         Start     Ordered   03/04/19  0000  Home infusion instructions    Question:  Instructions  Answer:  Flushing of vascular access device: 0.9% NaCl pre/post medication administration and prn patency; Heparin 100 u/ml, 67ml for implanted ports and Heparin 10u/ml, 44ml for all other central venous catheters.   03/04/19 1219         Allergies  Allergen Reactions  . Adhesive [Tape] Other (See Comments)    "Peels off skin"   Follow-up Information    Josue Hector, MD Follow up on 03/17/2019.   Specialty: Cardiology Why: 10:15 am with his Nurse Practitioner Kathyrn Drown.  Contact information: [id number] [place] [address] [phone]        Biagio Borg, MD. Call in 1 day(s).   Specialties: Internal Medicine, Radiology Why: Please call for a post hospital follow-up appointment. Contact information: [place] [address] [phone]  Constance Haw, MD .   Specialty: Cardiology Contact information: [address] [place] [address] [phone]        Tommy Medal, Lavell Islam, MD. Call in 1 day(s).   Specialty: Infectious Diseases Why: Please call for a post hospital follow-up appointment. Contact information: [address] [place] [address] [phone]            The results of significant diagnostics from this hospitalization (including imaging, microbiology, ancillary and laboratory) are listed below for reference.    Significant Diagnostic Studies: DG Chest Portable 1 View  Result Date: 02/27/2019 CLINICAL DATA:  Chest tightness and shortness of breath. EXAM: PORTABLE CHEST 1 VIEW COMPARISON:  01/24/2019 FINDINGS: 1740 hours. The cardio pericardial silhouette is enlarged. Right PICC line tip projects over the mid SVC level. There is pulmonary vascular congestion without overt pulmonary edema. No focal airspace consolidation or pleural effusion. The visualized bony structures of the thorax are intact. Telemetry  leads overlie the chest. IMPRESSION: Cardiomegaly with vascular congestion. No acute cardiopulmonary findings. Electronically Signed   By: Misty Stanley M.D.   On: 02/27/2019 17:53   ECHOCARDIOGRAM COMPLETE  Result Date: 03/01/2019   ECHOCARDIOGRAM REPORT   Patient Name:   Eric Santos Date of Exam: 03/01/2019 Medical Rec #:  [id number]      Height:       66.0 in Accession #:    [id number]     Weight:       243.1 lb Date of Birth:  1945-09-30     BSA:          2.17 m Patient Age:    39 years       BP:           101/77 mmHg Patient Gender: M              HR:           113 bpm. Exam Location:  Inpatient Procedure: 2D Echo, Color Doppler and Cardiac Doppler Indications:    Septic Arthritis  History:        Patient has prior history of Echocardiogram examinations, most                 recent 07/10/2017. Arrythmias:Atrial Fibrillation; Risk                 Factors:Sleep Apnea, Diabetes and Dyslipidemia.  Sonographer:    Raquel Sarna Senior RDCS Referring Phys: [phone] MIHAI CROITORU  Sonographer Comments: Technically difficult study due to poor echo windows. IMPRESSIONS  1. Left ventricular ejection fraction, by visual estimation, is 50 to 55%. The left ventricle has low normal function. There is mildly increased left ventricular hypertrophy.  2. Left ventricular diastolic function could not be evaluated.  3. The left ventricle has no regional wall motion abnormalities.  4. Global right ventricle has normal systolic function.The right ventricular size is normal. No increase in right ventricular wall thickness.  5. Left atrial size was mildly dilated.  6. Right atrial size was normal.  7. The mitral valve was not well visualized. Trivial mitral valve regurgitation.  8. The tricuspid valve is not well visualized.  9. The aortic valve was not well visualized. Aortic valve regurgitation is trivial. Mild aortic valve sclerosis without stenosis. 10. The pulmonic valve was not well visualized. Pulmonic valve regurgitation is not  visualized. 11. The inferior vena cava is normal in size with <50% respiratory variability, suggesting right atrial pressure of 8 mmHg. 12. The  interatrial septum was not assessed. 13. Suboptimal windows, no obvious large valvular vegetations. Consider TEE if pretest probability for endocarditis is high. FINDINGS  Left Ventricle: Left ventricular ejection fraction, by visual estimation, is 50 to 55%. The left ventricle has low normal function. The left ventricle has no regional wall motion abnormalities. There is mildly increased left ventricular hypertrophy. The  left ventricular diastology could not be evaluated due to atrial fibrillation. Left ventricular diastolic function could not be evaluated. Right Ventricle: The right ventricular size is normal. No increase in right ventricular wall thickness. Global RV systolic function is has normal systolic function. Left Atrium: Left atrial size was mildly dilated. Right Atrium: Right atrial size was normal in size Pericardium: There is no evidence of pericardial effusion. Mitral Valve: The mitral valve was not well visualized. Trivial mitral valve regurgitation. Tricuspid Valve: The tricuspid valve is not well visualized. Tricuspid valve regurgitation is trivial. Aortic Valve: The aortic valve was not well visualized. Aortic valve regurgitation is trivial. Mild aortic valve sclerosis is present, with no evidence of aortic valve stenosis. Pulmonic Valve: The pulmonic valve was not well visualized. Pulmonic valve regurgitation is not visualized. Pulmonic regurgitation is not visualized. Aorta: The aortic root and ascending aorta are structurally normal, with no evidence of dilitation. Venous: The inferior vena cava is normal in size with less than 50% respiratory variability, suggesting right atrial pressure of 8 mmHg. IAS/Shunts: The interatrial septum was not assessed.  LEFT VENTRICLE PLAX 2D LVIDd:         4.46 cm LVIDs:         3.35 cm LV PW:         1.06 cm LV IVS:         1.17 cm LVOT diam:     2.00 cm LV SV:         45 ml LV SV Index:   19.31 LVOT Area:     3.14 cm  RIGHT VENTRICLE RV S prime:     15.70 cm/s TAPSE (M-mode): 1.7 cm LEFT ATRIUM             Index       RIGHT ATRIUM           Index LA diam:        4.30 cm 1.98 cm/m  RA Area:     18.60 cm LA Vol (A2C):   61.3 ml 28.21 ml/m RA Volume:   47.70 ml  21.95 ml/m LA Vol (A4C):   84.5 ml 38.89 ml/m LA Biplane Vol: 78.5 ml 36.13 ml/m  AORTIC VALVE LVOT Vmax:   59.10 cm/s LVOT Vmean:  40.900 cm/s LVOT VTI:    0.095 m  AORTA Ao Root diam: 2.90 cm Ao Asc diam:  3.70 cm  SHUNTS Systemic VTI:  0.10 m Systemic Diam: 2.00 cm  Lyman Bishop MD Electronically signed by Lyman Bishop MD Signature Date/Time: 03/01/2019/10:32:58 AM    Final     Microbiology: Recent Results (from the past 240 hour(s))  SARS CORONAVIRUS 2 (TAT 6-24 HRS) Nasopharyngeal Nasopharyngeal Swab     Status: None   Collection Time: 02/27/19  9:10 PM   Specimen: Nasopharyngeal Swab  Result Value Ref Range Status   SARS Coronavirus 2 NEGATIVE NEGATIVE Final    Comment: (NOTE) SARS-CoV-2 target nucleic acids are NOT DETECTED. The SARS-CoV-2 RNA is generally detectable in upper and lower respiratory specimens during the acute phase of infection. Negative results do not preclude SARS-CoV-2 infection, do not rule out co-infections with  other pathogens, and should not be used as the sole basis for treatment or other patient management decisions. Negative results must be combined with clinical observations, patient history, and epidemiological information. The expected result is Negative. Fact Sheet for Patients: SugarRoll.be Fact Sheet for Healthcare Providers: https://www.woods-mathews.com/ This test is not yet approved or cleared by the [place] FDA and  has been authorized for detection and/or diagnosis of SARS-CoV-2 by FDA under an Emergency Use Authorization (EUA). This EUA will remain    in effect (meaning this test can be used) for the duration of the COVID-19 declaration under Section 56 4(b)(1) of the Act, 21 U.S.C. section 360bbb-3(b)(1), unless the authorization is terminated or revoked sooner. Performed at [facility], [place] [address] [place], [place] [address]   Culture, blood (routine x 2)     Status: None (Preliminary result)   Collection Time: 02/28/19  2:07 AM   Specimen: BLOOD RIGHT HAND  Result Value Ref Range Status   Specimen Description BLOOD RIGHT HAND  Final   Special Requests   Final    BOTTLES DRAWN AEROBIC AND ANAEROBIC Blood Culture adequate volume   Culture   Final    NO GROWTH 4 DAYS Performed at [facility], [place] [address], [place], [place] [address]    Report Status PENDING  Incomplete  Culture, blood (routine x 2)     Status: None (Preliminary result)   Collection Time: 02/28/19  3:24 AM   Specimen: BLOOD RIGHT HAND  Result Value Ref Range Status   Specimen Description BLOOD RIGHT HAND  Final   Special Requests   Final    BOTTLES DRAWN AEROBIC ONLY Blood Culture adequate volume   Culture   Final    NO GROWTH 4 DAYS Performed at [facility], [place] [address], [place], [place] [address]    Report Status PENDING  Incomplete  Culture, Urine     Status: Abnormal   Collection Time: 02/28/19  7:05 PM   Specimen: Urine, Random  Result Value Ref Range Status   Specimen Description URINE, RANDOM  Final   Special Requests   Final    NONE Performed at [facility], [place] [address], [place],  [address]    Culture >=100,000 COLONIES/mL ENTEROBACTER AEROGENES (A)  Final   Report Status 03/02/2019 FINAL  Final   Organism ID, Bacteria ENTEROBACTER AEROGENES (A)  Final      Susceptibility   Enterobacter aerogenes - MIC*    CEFAZOLIN RESISTANT Resistant     CEFTRIAXONE <=1 SENSITIVE Sensitive     CIPROFLOXACIN <=0.25 SENSITIVE Sensitive     GENTAMICIN <=1 SENSITIVE Sensitive     IMIPENEM 1 SENSITIVE Sensitive      NITROFURANTOIN 64 INTERMEDIATE Intermediate     TRIMETH/SULFA <=20 SENSITIVE Sensitive     PIP/TAZO 16 SENSITIVE Sensitive     * >=100,000 COLONIES/mL ENTEROBACTER AEROGENES     Labs: Basic Metabolic Panel: Recent Labs  Lab 02/28/19 0207 03/01/19 0620 03/02/19 0500 03/03/19 0602 03/04/19 0552  NA 136 134* 137 138 139  K 4.0 4.4 4.2 4.4 4.2  CL 103 97* 101 103 104  CO2 22 25 26 24 25   GLUCOSE 126* 102* 102* 111* 105*  BUN 9 12 14 14 12   CREATININE 0.60* 0.73 0.81 0.89 0.77  CALCIUM 8.6* 8.8* 8.9 8.7* 8.8*   Liver Function Tests: No results for input(s): AST, ALT, ALKPHOS, BILITOT, PROT, ALBUMIN in the last 168 hours. No results for input(s):  LIPASE, AMYLASE in the last 168 hours. No results for input(s): AMMONIA in the last 168 hours. CBC: Recent Labs  Lab 02/28/19 0207 03/01/19 0620 03/02/19 0500 03/03/19 0602 03/04/19 0552  WBC 13.6* 15.1* 12.1* 10.0 8.1  HGB 12.0* 12.5* 12.1* 12.3* 11.8*  HCT 37.1* 39.3 38.1* 39.0 37.1*  MCV 88.8 91.8 90.5 91.3 90.9  PLT 390 470* 457* 489* 467*   Cardiac Enzymes: Recent Labs  Lab 02/28/19 0207  CKTOTAL 38*   BNP: BNP (last 3 results) No results for input(s): BNP in the last 8760 hours.  ProBNP (last 3 results) No results for input(s): PROBNP in the last 8760 hours.  CBG: Recent Labs  Lab 03/03/19 1247 03/03/19 1615 03/03/19 2046 03/04/19 0732 03/04/19 1157  GLUCAP 97 164* 87 121* 94       Signed:  Kayleen Memos, MD [facility] 03/04/2019, 12:30 PM

## 2019-03-04 NOTE — Discharge Instructions (Signed)
Call if any problems to Dr. Johnsie Cancel or if back in atrial fib.    Atrial Fibrillation  Atrial fibrillation is a type of heartbeat that is irregular or fast. If you have this condition, your heart beats without any order. This makes it hard for your heart to pump blood in a normal way. Atrial fibrillation may come and go, or it may become a long-lasting problem. If this condition is not treated, it can put you at higher risk for stroke, heart failure, and other heart problems. What are the causes? This condition may be caused by diseases that damage the heart. They include:  High blood pressure.  Heart failure.  Heart valve disease.  Heart surgery. Other causes include:  Diabetes.  Thyroid disease.  Being overweight.  Kidney disease. Sometimes the cause is not known. What increases the risk? You are more likely to develop this condition if:  You are older.  You smoke.  You exercise often and very hard.  You have a family history of this condition.  You are a man.  You use drugs.  You drink a lot of alcohol.  You have lung conditions, such as emphysema, pneumonia, or COPD.  You have sleep apnea. What are the signs or symptoms? Common symptoms of this condition include:  A feeling that your heart is beating very fast.  Chest pain or discomfort.  Feeling short of breath.  Suddenly feeling light-headed or weak.  Getting tired easily during activity.  Fainting.  Sweating. In some cases, there are no symptoms. How is this treated? Treatment for this condition depends on underlying conditions and how you feel when you have atrial fibrillation. They include:  Medicines to: ? Prevent blood clots. ? Treat heart rate or heart rhythm problems.  Using devices, such as a pacemaker, to correct heart rhythm problems.  Doing surgery to remove the part of the heart that sends bad signals.  Closing an area where clots can form in the heart (left atrial  appendage). In some cases, your doctor will treat other underlying conditions. Follow these instructions at home: Medicines  Take over-the-counter and prescription medicines only as told by your doctor.  Do not take any new medicines without first talking to your doctor.  If you are taking blood thinners: ? Talk with your doctor before you take any medicines that have aspirin or NSAIDs, such as ibuprofen, in them. ? Take your medicine exactly as told by your doctor. Take it at the same time each day. ? Avoid activities that could hurt or bruise you. Follow instructions about how to prevent falls. ? Wear a bracelet that says you are taking blood thinners. Or, carry a card that lists what medicines you take. Lifestyle      Do not use any products that have nicotine or tobacco in them. These include cigarettes, e-cigarettes, and chewing tobacco. If you need help quitting, ask your doctor.  Eat heart-healthy foods. Talk with your doctor about the right eating plan for you.  Exercise regularly as told by your doctor.  Do not drink alcohol.  Lose weight if you are overweight.  Do not use drugs, including cannabis. General instructions  If you have a condition that causes breathing to stop for a short period of time (apnea), treat it as told by your doctor.  Keep a healthy weight. Do not use diet pills unless your doctor says they are safe for you. Diet pills may make heart problems worse.  Keep all follow-up visits as  told by your doctor. This is important. Contact a doctor if:  You notice a change in the speed, rhythm, or strength of your heartbeat.  You are taking a blood-thinning medicine and you get more bruising.  You get tired more easily when you move or exercise.  You have a sudden change in weight. Get help right away if:   You have pain in your chest or your belly (abdomen).  You have trouble breathing.  You have side effects of blood thinners, such as blood  in your vomit, poop (stool), or pee (urine), or bleeding that cannot stop.  You have any signs of a stroke. "BE FAST" is an easy way to remember the main warning signs: ? B - Balance. Signs are dizziness, sudden trouble walking, or loss of balance. ? E - Eyes. Signs are trouble seeing or a change in how you see. ? F - Face. Signs are sudden weakness or loss of feeling in the face, or the face or eyelid drooping on one side. ? A - Arms. Signs are weakness or loss of feeling in an arm. This happens suddenly and usually on one side of the body. ? S - Speech. Signs are sudden trouble speaking, slurred speech, or trouble understanding what people say. ? T - Time. Time to call emergency services. Write down what time symptoms started.  You have other signs of a stroke, such as: ? A sudden, very bad headache with no known cause. ? Feeling like you may vomit (nausea). ? Vomiting. ? A seizure. These symptoms may be an emergency. Do not wait to see if the symptoms will go away. Get medical help right away. Call your local emergency services (911 in the [place] Do not drive yourself to the hospital. Summary  Atrial fibrillation is a type of heartbeat that is irregular or fast.  You are at higher risk of this condition if you smoke, are older, have diabetes, or are overweight.  Follow your doctor's instructions about medicines, diet, exercise, and follow-up visits.  Get help right away if you have signs or symptoms of a stroke.  Get help right away if you cannot catch your breath, or you have chest pain or discomfort. This information is not intended to replace advice given to you by your health care provider. Make sure you discuss any questions you have with your health care provider. Document Revised: 08/12/2018 Document Reviewed: 08/12/2018 Elsevier Patient Education  [place].  Septic Arthritis Septic arthritis is inflammation of a joint that results from an infection. The infection  occurs when bacteria or other germs get inside a joint. The knee and hip joints are most often affected, but other joints may also become infected. Usually, just one joint is affected. Joint infections need to be treated quickly to prevent damage to the joint, and to prevent the infection from spreading to other areas of your body. What are the causes? This condition is most often caused by Staphylococcus bacteria. Other causes may include:  Fungal infections.  Sexually transmitted infections (STIs).  Tuberculosis. Bacteria or other germs can spread to the joint. These bacteria are usually from:  Blood carrying germs from an infection in another part of your body to your joint. This is the most common cause of septic arthritis.  An open wound near the joint.  A needle put into the joint.  Joint surgery.  An infection in the bone (osteomyelitis) that spreads to the joint. What increases the risk? You may have  a higher risk for this condition if you:  Have an artificial joint.  Have a blood or skin infection.  Have open sores or wounds on your skin.  Had a recent joint surgery or procedure.  Had a recent joint injury.  Have a long-term (chronic) disease, such as: ? Diabetes. ? Osteoarthritis. ? Rheumatoid arthritis. ? HIV (human immunodeficiency virus).  Have a condition or take medicines that weaken your body's defense system (immune system).  Use IV medicines.  Have gonorrhea.  Have a central line for IV access. What are the signs or symptoms? Symptoms of this condition include:  Swelling at the joint.  Severe pain in the joint.  Redness and warmth in the joint.  Being unable to move the joint.  Fever and chills. How is this diagnosed? This condition may be diagnosed based on:  Your symptoms.  Your medical history.  A physical exam.  Other tests to confirm the diagnosis. These may include: ? Removing fluid from your joint to look for signs of  infection (synovial fluid analysis). ? Blood tests.  Imaging studies. These may include: ? X-rays. ? MRI. ? CT scan. ? Ultrasound. How is this treated? This condition may be treated by:  Draining fluid from your joint. This may be done for several days in order to relieve pain.  Taking antibiotic medicine. This may be given by IV or by mouth. It may be done in a hospital at first. You may have to continue antibiotics at home by IV or by mouth for several weeks after that.  Surgery to remove: ? Infected fluid and tissue from the joint. ? An infected artificial joint. After the infection has started to heal, you may need physical therapy to regain strength and motion of the joint. Follow these instructions at home: Medicines   Take over-the-counter and prescription medicines only as told by your health care provider.  If you were prescribed an antibiotic medicine, take it as told by your health care provider. Do not stop taking the antibiotic even if you start to feel better.  Follow instructions from your health care provider about how to take antibiotics at home by IV. You may need to have a nurse come to your home to give you antibiotics through IV. Managing pain, stiffness, and swelling   If directed, put ice on the affected area: ? Put ice in a plastic bag. ? Place a towel between your skin and the bag. ? Leave the ice on for 20 minutes, 2-3 times a day.  Raise (elevate) the affected area above the level of your heart while you are sitting or lying down. Activity  Return to your normal activities as told by your health care provider. Ask your health care provider what activities are safe for you.  Do any exercises or stretches as told by your health care provider or physical therapist. General instructions  Do not use any products that contain nicotine or tobacco, such as cigarettes and e-cigarettes. These can delay healing. If you need help quitting, ask your health  care provider.  Wash your hands often with soap and water. If soap and water are not available, use hand sanitizer.  Keep all follow-up visits as told by your health care provider. This is important. Contact a health care provider if you:  Have pain that is not controlled with medicine.  Develop a fever or chills.  Have redness, warmth, pain, or swelling that returns after treatment. Get help right away if you:  Have signs of worsening infection in your joint. Watch for: ? Very severe pain. ? Redness. ? Warmth. ? Swelling.  Have rapid breathing or you have trouble breathing.  Have chest pain.  Cannot drink fluids or make urine.  Notice that the affected area changed color or turned blue.  Have numbness or severe pain in the affected area. Summary  Septic arthritis is inflammation of a joint that occurs when bacteria or other germs get inside a joint and cause an infection.  Joint infections need to be treated quickly to prevent damage to the joint, and to prevent the infection from spreading to other areas of your body.  Symptoms of joint infection include redness, warmth, swelling, pain, and being unable to move a joint.  Treatment usually involves draining fluid from the joint and taking antibiotic medicine. This information is not intended to replace advice given to you by your health care provider. Make sure you discuss any questions you have with your health care provider. Document Revised: 06/12/2018 Document Reviewed: 04/01/2017 Elsevier Patient Education  2020 [employer].

## 2019-03-04 NOTE — Progress Notes (Signed)
Subjective:  He does not want to be on the daptomycin as he feels it is causing nausea and vomiting and that is because of his atrial fibrillation   Antibiotics:  Anti-infectives (From admission, onward)   Start     Dose/Rate Route Frequency Ordered Stop   03/04/19 2000  DAPTOmycin (CUBICIN) 500 mg in sodium chloride 0.9 % IVPB  Status:  Discontinued     500 mg 220 mL/hr over 30 Minutes Intravenous Daily 03/04/19 0914 03/04/19 1155   03/04/19 1300  cefTRIAXone (ROCEPHIN) 1 g in sodium chloride 0.9 % 100 mL IVPB  Status:  Discontinued     1 g 200 mL/hr over 30 Minutes Intravenous Every 24 hours 03/04/19 0914 03/04/19 1155   03/04/19 1200  cephALEXin (KEFLEX) capsule 500 mg  Status:  Discontinued     500 mg Oral Every 6 hours 03/04/19 0852 03/04/19 0854   03/04/19 1200  cephALEXin (KEFLEX) capsule 500 mg  Status:  Discontinued     500 mg Oral Every 6 hours 03/04/19 0854 03/04/19 0913   03/04/19 1200  minocycline (MINOCIN) capsule 100 mg     100 mg Oral 2 times daily 03/04/19 1155 03/03/20 0959   03/04/19 1200  cephALEXin (KEFLEX) capsule 500 mg     500 mg Oral Every 6 hours 03/04/19 1157 03/03/20 1159   03/04/19 1000  cefdinir (OMNICEF) capsule 300 mg  Status:  Discontinued     300 mg Oral Every 12 hours 03/04/19 0847 03/04/19 0852   03/04/19 0000  cephALEXin (KEFLEX) 500 MG capsule     500 mg Oral Every 6 hours 03/04/19 1219 04/03/19 2359   03/04/19 0000  minocycline (MINOCIN) 100 MG capsule     100 mg Oral 2 times daily 03/04/19 1219 04/03/19 2359   03/01/19 1200  cefTRIAXone (ROCEPHIN) 1 g in sodium chloride 0.9 % 100 mL IVPB  Status:  Discontinued     1 g 200 mL/hr over 30 Minutes Intravenous Every 24 hours 03/01/19 1039 03/04/19 0846   02/27/19 2200  DAPTOmycin (CUBICIN) 500 mg in sodium chloride 0.9 % IVPB  Status:  Discontinued     500 mg 220 mL/hr over 30 Minutes Intravenous Daily 02/27/19 2154 03/04/19 0908      Medications: Scheduled Meds: . acidophilus  1  capsule Oral QHS  . apixaban  5 mg Oral BID  . cephALEXin  500 mg Oral Q6H  . Chlorhexidine Gluconate Cloth  6 each Topical Daily  . diltiazem  60 mg Oral Q6H  . insulin aspart  0-15 Units Subcutaneous TID WC  . loratadine  10 mg Oral Daily  . metoprolol tartrate  50 mg Oral TID  . minocycline  100 mg Oral BID   Continuous Infusions: PRN Meds:.ondansetron (ZOFRAN) IV, sodium chloride flush, tiZANidine, traMADol    Objective: Weight change:   Intake/Output Summary (Last 24 hours) at 03/04/2019 1302 Last data filed at 03/04/2019 0501 Gross per 24 hour  Intake 360 ml  Output 1940 ml  Net -1580 ml   Blood pressure 112/85, pulse 95, temperature 98.1 F (36.7 C), temperature source Oral, resp. rate 19, height 5\' 6"  (1.676 m), weight 110.9 kg, SpO2 97 %. Temp:  [97.8 F (36.6 C)-98.2 F (36.8 C)] 98.1 F (36.7 C) (12/31 0456) Pulse Rate:  [70-95] 95 (12/31 0827) Resp:  [19] 19 (12/30 1616) BP: (109-123)/(80-88) 112/85 (12/31 1202) SpO2:  [97 %-99 %] 97 % (12/31 0456) Weight:  [110.9 kg] 110.9 kg (12/31  0456)  Physical Exam: General: Alert and awake, oriented x3, not in any acute distress.  Walking around the room and talking with me and his wife HEENT: anicteric sclera, EOMI CVS regular rate, normal  Chest: , no wheezing, no respiratory distress Abdomen: soft non-distended,  Extremities: no edema or deformity noted bilaterally Skin: PICC line in place Neuro: nonfocal  CBC:    BMET Recent Labs    03/03/19 0602 03/04/19 0552  NA 138 139  K 4.4 4.2  CL 103 104  CO2 24 25  GLUCOSE 111* 105*  BUN 14 12  CREATININE 0.89 0.77  CALCIUM 8.7* 8.8*     Liver Panel  No results for input(s): PROT, ALBUMIN, AST, ALT, ALKPHOS, BILITOT, BILIDIR, IBILI in the last 72 hours.     Sedimentation Rate No results for input(s): ESRSEDRATE in the last 72 hours. C-Reactive Protein No results for input(s): CRP in the last 72 hours.  Micro Results: Recent Results (from  the past 720 hour(s))  SARS CORONAVIRUS 2 (TAT 6-24 HRS) Nasopharyngeal Nasopharyngeal Swab     Status: None   Collection Time: 02/27/19  9:10 PM   Specimen: Nasopharyngeal Swab  Result Value Ref Range Status   SARS Coronavirus 2 NEGATIVE NEGATIVE Final    Comment: (NOTE) SARS-CoV-2 target nucleic acids are NOT DETECTED. The SARS-CoV-2 RNA is generally detectable in upper and lower respiratory specimens during the acute phase of infection. Negative results do not preclude SARS-CoV-2 infection, do not rule out co-infections with other pathogens, and should not be used as the sole basis for treatment or other patient management decisions. Negative results must be combined with clinical observations, patient history, and epidemiological information. The expected result is Negative. Fact Sheet for Patients: SugarRoll.be Fact Sheet for Healthcare Providers: https://www.woods-mathews.com/ This test is not yet approved or cleared by the [place] FDA and  has been authorized for detection and/or diagnosis of SARS-CoV-2 by FDA under an Emergency Use Authorization (EUA). This EUA will remain  in effect (meaning this test can be used) for the duration of the COVID-19 declaration under Section 56 4(b)(1) of the Act, 21 U.S.C. section 360bbb-3(b)(1), unless the authorization is terminated or revoked sooner. Performed at [facility], [place] [address] [place], [place] [address]   Culture, blood (routine x 2)     Status: None (Preliminary result)   Collection Time: 02/28/19  2:07 AM   Specimen: BLOOD RIGHT HAND  Result Value Ref Range Status   Specimen Description BLOOD RIGHT HAND  Final   Special Requests   Final    BOTTLES DRAWN AEROBIC AND ANAEROBIC Blood Culture adequate volume   Culture   Final    NO GROWTH 4 DAYS Performed at [facility], [place] [address], [place], [place] [address]    Report Status PENDING  Incomplete  Culture,  blood (routine x 2)     Status: None (Preliminary result)   Collection Time: 02/28/19  3:24 AM   Specimen: BLOOD RIGHT HAND  Result Value Ref Range Status   Specimen Description BLOOD RIGHT HAND  Final   Special Requests   Final    BOTTLES DRAWN AEROBIC ONLY Blood Culture adequate volume   Culture   Final    NO GROWTH 4 DAYS Performed at [facility], [place] [address] [place], [place] [address]    Report Status PENDING  Incomplete  Culture, Urine     Status: Abnormal   Collection Time: 02/28/19  7:05 PM  Specimen: Urine, Random  Result Value Ref Range Status   Specimen Description URINE, RANDOM  Final   Special Requests   Final    NONE Performed at [facility], [address] [place], [place] [address]    Culture >=100,000 COLONIES/mL ENTEROBACTER AEROGENES (A)  Final   Report Status 03/02/2019 FINAL  Final   Organism ID, Bacteria ENTEROBACTER AEROGENES (A)  Final      Susceptibility   Enterobacter aerogenes - MIC*    CEFAZOLIN RESISTANT Resistant     CEFTRIAXONE <=1 SENSITIVE Sensitive     CIPROFLOXACIN <=0.25 SENSITIVE Sensitive     GENTAMICIN <=1 SENSITIVE Sensitive     IMIPENEM 1 SENSITIVE Sensitive     NITROFURANTOIN 64 INTERMEDIATE Intermediate     TRIMETH/SULFA <=20 SENSITIVE Sensitive     PIP/TAZO 16 SENSITIVE Sensitive     * >=100,000 COLONIES/mL ENTEROBACTER AEROGENES    Studies/Results: No results found.    Assessment/Plan:  INTERVAL HISTORY:    Principal Problem:   Atrial fibrillation with RVR (HCC) Active Problems:   Obstructive sleep apnea   Infection of prosthetic right knee joint (HCC)   Type 2 diabetes mellitus with hyperlipidemia ([place])    Eric Santos is a 73 y.o. 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 TMP/SMX,  clinda R whom I saw at [facility] and placed on daptomycin.  Since discharge he was having trouble tolerating the daptomycin and has apparently been having nausea that he attributes to the antibiotic.  He was admitted with chest pressure and found to be in atrial fibrillation which is being controlled.  He does not want to be on the daptomycin anymore and is insisted that it be stopped.  He is about 6 days away from completing his course of IV antibiotics which I always felt intended on following with oral antibiotics.   #1 prosthetic joint infection: Fine to discontinue his IV antibiotics and his PICC line please start minocycline 100 mg twice daily and Keflex 500 mg 4 times daily.  Please provide him with a month of antibiotics with 11 refills.  He already has follow-up with me in the infectious disease clinic.   LOS: 4 days   Alcide Evener 03/04/2019, 1:02 PM

## 2019-03-04 NOTE — Care Management Important Message (Signed)
Important Message  Patient Details  Name: Eric Santos MRN: [id number] Date of Birth: [birth year]   Medicare Important Message Given:  Yes     [name], [name] 03/04/2019, 2:44 PM

## 2019-03-05 DIAGNOSIS — Z7984 Long term (current) use of oral hypoglycemic drugs: Secondary | ICD-10-CM | POA: Diagnosis not present

## 2019-03-05 DIAGNOSIS — T8453XD Infection and inflammatory reaction due to internal right knee prosthesis, subsequent encounter: Secondary | ICD-10-CM | POA: Diagnosis not present

## 2019-03-05 DIAGNOSIS — E668 Other obesity: Secondary | ICD-10-CM | POA: Diagnosis not present

## 2019-03-05 DIAGNOSIS — N390 Urinary tract infection, site not specified: Secondary | ICD-10-CM | POA: Diagnosis not present

## 2019-03-05 DIAGNOSIS — E785 Hyperlipidemia, unspecified: Secondary | ICD-10-CM | POA: Diagnosis not present

## 2019-03-05 DIAGNOSIS — I480 Paroxysmal atrial fibrillation: Secondary | ICD-10-CM | POA: Diagnosis not present

## 2019-03-05 DIAGNOSIS — Z7901 Long term (current) use of anticoagulants: Secondary | ICD-10-CM | POA: Diagnosis not present

## 2019-03-05 DIAGNOSIS — E119 Type 2 diabetes mellitus without complications: Secondary | ICD-10-CM | POA: Diagnosis not present

## 2019-03-05 DIAGNOSIS — G4733 Obstructive sleep apnea (adult) (pediatric): Secondary | ICD-10-CM | POA: Diagnosis not present

## 2019-03-05 LAB — CULTURE, BLOOD (ROUTINE X 2)
Culture: NO GROWTH
Culture: NO GROWTH
Special Requests: ADEQUATE
Special Requests: ADEQUATE

## 2019-03-08 ENCOUNTER — Telehealth: Payer: Self-pay | Admitting: Internal Medicine

## 2019-03-08 NOTE — Telephone Encounter (Signed)
Please advise 

## 2019-03-08 NOTE — Telephone Encounter (Signed)
Change of insurance needs new verbals  Home Health Verbal Orders - Caller/Agency: Dena with [facility] Callback Number: [phone], OK to leave a message Requesting:  Nursing Frequency: admission (done 03/05/2019) 1 week 1, 2 week 2, then once every other week for 4 weeks.

## 2019-03-08 NOTE — Telephone Encounter (Signed)
Ok for verbal 

## 2019-03-09 ENCOUNTER — Encounter: Payer: Self-pay | Admitting: Infectious Disease

## 2019-03-09 ENCOUNTER — Ambulatory Visit: Payer: Medicare PPO | Admitting: Infectious Disease

## 2019-03-09 ENCOUNTER — Other Ambulatory Visit: Payer: Self-pay

## 2019-03-09 VITALS — Ht 66.0 in | Wt 242.0 lb

## 2019-03-09 DIAGNOSIS — I4891 Unspecified atrial fibrillation: Secondary | ICD-10-CM

## 2019-03-09 DIAGNOSIS — R195 Other fecal abnormalities: Secondary | ICD-10-CM | POA: Diagnosis not present

## 2019-03-09 DIAGNOSIS — R112 Nausea with vomiting, unspecified: Secondary | ICD-10-CM | POA: Diagnosis not present

## 2019-03-09 DIAGNOSIS — T8453XD Infection and inflammatory reaction due to internal right knee prosthesis, subsequent encounter: Secondary | ICD-10-CM | POA: Diagnosis not present

## 2019-03-09 HISTORY — DX: Other fecal abnormalities: R19.5

## 2019-03-09 HISTORY — DX: Nausea with vomiting, unspecified: R11.2

## 2019-03-09 NOTE — Telephone Encounter (Signed)
Called office and spoke with Neoma Laming and gave her verbal orders for patient
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
## 2019-03-09 NOTE — Telephone Encounter (Signed)
Called [facility] spoke w/Mellissa since Coralyn Helling is out of the office gave MD response.Marland KitchenJohny Chess

## 2019-03-10 DIAGNOSIS — E119 Type 2 diabetes mellitus without complications: Secondary | ICD-10-CM | POA: Diagnosis not present

## 2019-03-10 DIAGNOSIS — Z7901 Long term (current) use of anticoagulants: Secondary | ICD-10-CM | POA: Diagnosis not present

## 2019-03-10 DIAGNOSIS — Z7984 Long term (current) use of oral hypoglycemic drugs: Secondary | ICD-10-CM | POA: Diagnosis not present

## 2019-03-10 DIAGNOSIS — N390 Urinary tract infection, site not specified: Secondary | ICD-10-CM | POA: Diagnosis not present

## 2019-03-10 DIAGNOSIS — I480 Paroxysmal atrial fibrillation: Secondary | ICD-10-CM | POA: Diagnosis not present

## 2019-03-10 DIAGNOSIS — E668 Other obesity: Secondary | ICD-10-CM | POA: Diagnosis not present

## 2019-03-10 DIAGNOSIS — E785 Hyperlipidemia, unspecified: Secondary | ICD-10-CM | POA: Diagnosis not present

## 2019-03-10 DIAGNOSIS — T8453XD Infection and inflammatory reaction due to internal right knee prosthesis, subsequent encounter: Secondary | ICD-10-CM | POA: Diagnosis not present

## 2019-03-10 DIAGNOSIS — G4733 Obstructive sleep apnea (adult) (pediatric): Secondary | ICD-10-CM | POA: Diagnosis not present

## 2019-03-11 ENCOUNTER — Other Ambulatory Visit: Payer: Self-pay

## 2019-03-11 ENCOUNTER — Other Ambulatory Visit (INDEPENDENT_AMBULATORY_CARE_PROVIDER_SITE_OTHER): Payer: Medicare PPO

## 2019-03-11 ENCOUNTER — Telehealth: Payer: Self-pay

## 2019-03-11 DIAGNOSIS — E538 Deficiency of other specified B group vitamins: Secondary | ICD-10-CM | POA: Diagnosis not present

## 2019-03-11 DIAGNOSIS — E119 Type 2 diabetes mellitus without complications: Secondary | ICD-10-CM

## 2019-03-11 DIAGNOSIS — E611 Iron deficiency: Secondary | ICD-10-CM

## 2019-03-11 DIAGNOSIS — Z0000 Encounter for general adult medical examination without abnormal findings: Secondary | ICD-10-CM

## 2019-03-11 DIAGNOSIS — E559 Vitamin D deficiency, unspecified: Secondary | ICD-10-CM

## 2019-03-11 LAB — MICROALBUMIN / CREATININE URINE RATIO
Creatinine,U: 38.2 mg/dL
Microalb Creat Ratio: 4.6 mg/g (ref 0.0–30.0)
Microalb, Ur: 1.7 mg/dL (ref 0.0–1.9)

## 2019-03-11 LAB — URINALYSIS, ROUTINE W REFLEX MICROSCOPIC
Bilirubin Urine: NEGATIVE
Hgb urine dipstick: NEGATIVE
Ketones, ur: NEGATIVE
Leukocytes,Ua: NEGATIVE
Nitrite: NEGATIVE
RBC / HPF: NONE SEEN (ref 0–?)
Specific Gravity, Urine: 1.01 (ref 1.000–1.030)
Total Protein, Urine: NEGATIVE
Urine Glucose: NEGATIVE
Urobilinogen, UA: 0.2 (ref 0.0–1.0)
pH: 6.5 (ref 5.0–8.0)

## 2019-03-11 LAB — LIPID PANEL
Cholesterol: 118 mg/dL (ref 0–200)
HDL: 34.4 mg/dL — ABNORMAL LOW (ref >39.00)
LDL Cholesterol: 53 mg/dL (ref 0–99)
NonHDL: 83.12
Total CHOL/HDL Ratio: 3
Triglycerides: 152 mg/dL — ABNORMAL HIGH (ref 0.0–149.0)
VLDL: 30.4 mg/dL (ref 0.0–40.0)

## 2019-03-11 LAB — BASIC METABOLIC PANEL
BUN: 14 mg/dL (ref 6–23)
CO2: 29 mEq/L (ref 19–32)
Calcium: 9.6 mg/dL (ref 8.4–10.5)
Chloride: 104 mEq/L (ref 96–112)
Creatinine, Ser: 0.79 mg/dL (ref 0.40–1.50)
GFR: 96.36 mL/min (ref >60.00)
Glucose, Bld: 115 mg/dL — ABNORMAL HIGH (ref 70–99)
Potassium: 4.7 mEq/L (ref 3.5–5.1)
Sodium: 141 mEq/L (ref 135–145)

## 2019-03-11 LAB — CBC WITH DIFFERENTIAL/PLATELET
Basophils Absolute: 0.1 10*3/uL (ref 0.0–0.1)
Basophils Relative: 1.4 % (ref 0.0–3.0)
Eosinophils Absolute: 0.2 10*3/uL (ref 0.0–0.7)
Eosinophils Relative: 2.4 % (ref 0.0–5.0)
HCT: 42 % (ref 39.0–52.0)
Hemoglobin: 13.3 g/dL (ref 13.0–17.0)
Lymphocytes Relative: 22.9 % (ref 12.0–46.0)
Lymphs Abs: 1.6 10*3/uL (ref 0.7–4.0)
MCHC: 31.8 g/dL (ref 30.0–36.0)
MCV: 89.5 fl (ref 78.0–100.0)
Monocytes Absolute: 0.6 10*3/uL (ref 0.1–1.0)
Monocytes Relative: 8.1 % (ref 3.0–12.0)
Neutro Abs: 4.6 10*3/uL (ref 1.4–7.7)
Neutrophils Relative %: 65.2 % (ref 43.0–77.0)
Platelets: 357 10*3/uL (ref 150.0–400.0)
RBC: 4.69 Mil/uL (ref 4.22–5.81)
RDW: 14.5 % (ref 11.5–15.5)
WBC: 7.1 10*3/uL (ref 4.0–10.5)

## 2019-03-11 LAB — HEPATIC FUNCTION PANEL
ALT: 36 U/L (ref 0–53)
AST: 23 U/L (ref 0–37)
Albumin: 3.8 g/dL (ref 3.5–5.2)
Alkaline Phosphatase: 65 U/L (ref 39–117)
Bilirubin, Direct: 0.1 mg/dL (ref 0.0–0.3)
Total Bilirubin: 0.5 mg/dL (ref 0.2–1.2)
Total Protein: 6.4 g/dL (ref 6.0–8.3)

## 2019-03-11 LAB — TSH: TSH: 1.02 u[IU]/mL (ref 0.35–4.50)

## 2019-03-11 LAB — PSA: PSA: 3.9 ng/mL (ref 0.10–4.00)

## 2019-03-11 LAB — IBC PANEL
Iron: 89 ug/dL (ref 42–165)
Saturation Ratios: 25.2 % (ref 20.0–50.0)
Transferrin: 252 mg/dL (ref 212.0–360.0)

## 2019-03-11 LAB — VITAMIN B12: Vitamin B-12: >1500 pg/mL — ABNORMAL HIGH (ref 211–911)

## 2019-03-11 LAB — HEMOGLOBIN A1C: Hgb A1c MFr Bld: 6.5 % (ref 4.6–6.5)

## 2019-03-11 NOTE — Telephone Encounter (Signed)
Orders done

## 2019-03-11 NOTE — Addendum Note (Signed)
Addended by: Karle Barr on: 03/11/2019 08:39 AM   Modules accepted: Orders

## 2019-03-11 NOTE — Telephone Encounter (Signed)
Patient is here in office right now, would like to get lab work done today if possible before hospital follow up office visit with dr Jenny Reichmann on 03/17/19----routing to dr Jenny Reichmann, can you please enter lab orders, patient will be advised to wait, thanks

## 2019-03-12 DIAGNOSIS — E785 Hyperlipidemia, unspecified: Secondary | ICD-10-CM | POA: Diagnosis not present

## 2019-03-12 DIAGNOSIS — G4733 Obstructive sleep apnea (adult) (pediatric): Secondary | ICD-10-CM | POA: Diagnosis not present

## 2019-03-12 DIAGNOSIS — E119 Type 2 diabetes mellitus without complications: Secondary | ICD-10-CM | POA: Diagnosis not present

## 2019-03-12 DIAGNOSIS — N390 Urinary tract infection, site not specified: Secondary | ICD-10-CM | POA: Diagnosis not present

## 2019-03-12 DIAGNOSIS — Z7984 Long term (current) use of oral hypoglycemic drugs: Secondary | ICD-10-CM | POA: Diagnosis not present

## 2019-03-12 DIAGNOSIS — E668 Other obesity: Secondary | ICD-10-CM | POA: Diagnosis not present

## 2019-03-12 DIAGNOSIS — I480 Paroxysmal atrial fibrillation: Secondary | ICD-10-CM | POA: Diagnosis not present

## 2019-03-12 DIAGNOSIS — Z7901 Long term (current) use of anticoagulants: Secondary | ICD-10-CM | POA: Diagnosis not present

## 2019-03-12 DIAGNOSIS — T8453XD Infection and inflammatory reaction due to internal right knee prosthesis, subsequent encounter: Secondary | ICD-10-CM | POA: Diagnosis not present
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
## 2019-03-16 DIAGNOSIS — G4733 Obstructive sleep apnea (adult) (pediatric): Secondary | ICD-10-CM | POA: Diagnosis not present

## 2019-03-16 DIAGNOSIS — Z7984 Long term (current) use of oral hypoglycemic drugs: Secondary | ICD-10-CM | POA: Diagnosis not present

## 2019-03-16 DIAGNOSIS — T8453XD Infection and inflammatory reaction due to internal right knee prosthesis, subsequent encounter: Secondary | ICD-10-CM | POA: Diagnosis not present

## 2019-03-16 DIAGNOSIS — I471 Supraventricular tachycardia: Secondary | ICD-10-CM | POA: Diagnosis not present

## 2019-03-16 DIAGNOSIS — I480 Paroxysmal atrial fibrillation: Secondary | ICD-10-CM | POA: Diagnosis not present

## 2019-03-16 DIAGNOSIS — Z7901 Long term (current) use of anticoagulants: Secondary | ICD-10-CM | POA: Diagnosis not present

## 2019-03-16 DIAGNOSIS — E668 Other obesity: Secondary | ICD-10-CM | POA: Diagnosis not present

## 2019-03-16 DIAGNOSIS — E119 Type 2 diabetes mellitus without complications: Secondary | ICD-10-CM | POA: Diagnosis not present

## 2019-03-16 DIAGNOSIS — N390 Urinary tract infection, site not specified: Secondary | ICD-10-CM | POA: Diagnosis not present

## 2019-03-16 DIAGNOSIS — E785 Hyperlipidemia, unspecified: Secondary | ICD-10-CM | POA: Diagnosis not present

## 2019-03-17 ENCOUNTER — Encounter (HOSPITAL_COMMUNITY): Payer: Self-pay | Admitting: Nurse Practitioner

## 2019-03-17 ENCOUNTER — Other Ambulatory Visit: Payer: Self-pay

## 2019-03-17 ENCOUNTER — Ambulatory Visit (INDEPENDENT_AMBULATORY_CARE_PROVIDER_SITE_OTHER): Payer: Medicare PPO | Admitting: Internal Medicine

## 2019-03-17 ENCOUNTER — Encounter: Payer: Self-pay | Admitting: Internal Medicine

## 2019-03-17 ENCOUNTER — Ambulatory Visit: Payer: Medicare PPO | Admitting: Cardiology

## 2019-03-17 ENCOUNTER — Telehealth: Payer: Self-pay | Admitting: Cardiology

## 2019-03-17 ENCOUNTER — Ambulatory Visit (HOSPITAL_COMMUNITY)
Admission: RE | Admit: 2019-03-17 | Discharge: 2019-03-17 | Disposition: A | Discharge status: Home / Self Care | Payer: Medicare PPO | Source: Ambulatory Visit | Attending: Nurse Practitioner | Admitting: Nurse Practitioner

## 2019-03-17 ENCOUNTER — Encounter: Payer: Self-pay | Admitting: Cardiology

## 2019-03-17 VITALS — BP 128/82 | HR 68 | Temp 97.7°F | Ht 66.0 in | Wt 243.0 lb

## 2019-03-17 VITALS — BP 112/78 | HR 72 | Ht 66.0 in | Wt 245.8 lb

## 2019-03-17 VITALS — BP 114/70 | HR 66 | Ht 66.0 in | Wt 246.6 lb

## 2019-03-17 DIAGNOSIS — Z0000 Encounter for general adult medical examination without abnormal findings: Secondary | ICD-10-CM

## 2019-03-17 DIAGNOSIS — I4892 Unspecified atrial flutter: Secondary | ICD-10-CM | POA: Diagnosis not present

## 2019-03-17 DIAGNOSIS — Z7984 Long term (current) use of oral hypoglycemic drugs: Secondary | ICD-10-CM | POA: Diagnosis not present

## 2019-03-17 DIAGNOSIS — I480 Paroxysmal atrial fibrillation: Secondary | ICD-10-CM | POA: Diagnosis not present

## 2019-03-17 DIAGNOSIS — N390 Urinary tract infection, site not specified: Secondary | ICD-10-CM | POA: Insufficient documentation

## 2019-03-17 DIAGNOSIS — I10 Essential (primary) hypertension: Secondary | ICD-10-CM | POA: Diagnosis not present

## 2019-03-17 DIAGNOSIS — E785 Hyperlipidemia, unspecified: Secondary | ICD-10-CM | POA: Insufficient documentation

## 2019-03-17 DIAGNOSIS — Z79899 Other long term (current) drug therapy: Secondary | ICD-10-CM | POA: Insufficient documentation

## 2019-03-17 DIAGNOSIS — E1169 Type 2 diabetes mellitus with other specified complication: Secondary | ICD-10-CM

## 2019-03-17 DIAGNOSIS — M00069 Staphylococcal arthritis, unspecified knee: Secondary | ICD-10-CM | POA: Diagnosis not present

## 2019-03-17 DIAGNOSIS — E118 Type 2 diabetes mellitus with unspecified complications: Secondary | ICD-10-CM | POA: Insufficient documentation

## 2019-03-17 DIAGNOSIS — Z7901 Long term (current) use of anticoagulants: Secondary | ICD-10-CM | POA: Diagnosis not present

## 2019-03-17 DIAGNOSIS — G4733 Obstructive sleep apnea (adult) (pediatric): Secondary | ICD-10-CM | POA: Diagnosis not present

## 2019-03-17 DIAGNOSIS — B958 Unspecified staphylococcus as the cause of diseases classified elsewhere: Secondary | ICD-10-CM | POA: Insufficient documentation

## 2019-03-17 DIAGNOSIS — R972 Elevated prostate specific antigen [PSA]: Secondary | ICD-10-CM | POA: Diagnosis not present

## 2019-03-17 DIAGNOSIS — I4891 Unspecified atrial fibrillation: Secondary | ICD-10-CM | POA: Insufficient documentation

## 2019-03-17 DIAGNOSIS — D6869 Other thrombophilia: Secondary | ICD-10-CM

## 2019-03-17 DIAGNOSIS — Z8249 Family history of ischemic heart disease and other diseases of the circulatory system: Secondary | ICD-10-CM | POA: Diagnosis not present

## 2019-03-17 DIAGNOSIS — M158 Other polyosteoarthritis: Secondary | ICD-10-CM | POA: Insufficient documentation

## 2019-03-17 MED ORDER — ROSUVASTATIN CALCIUM 10 MG PO TABS: 10.0000 mg | ORAL_TABLET | Freq: Every day | ORAL | 3 refills | Status: DC

## 2019-03-17 MED ORDER — MINOCYCLINE HCL 100 MG PO CAPS: 100.0000 mg | ORAL_CAPSULE | Freq: Two times a day (BID) | ORAL | 3 refills | Status: AC

## 2019-03-17 MED ORDER — DILTIAZEM HCL ER COATED BEADS 360 MG PO CP24: 360.0000 mg | ORAL_CAPSULE | Freq: Every day | ORAL | 3 refills | Status: DC

## 2019-03-17 MED ORDER — ONETOUCH ULTRA VI STRP: ORAL_STRIP | 5 refills | Status: DC

## 2019-03-17 MED ORDER — APIXABAN 5 MG PO TABS: 5.0000 mg | ORAL_TABLET | Freq: Two times a day (BID) | ORAL | 3 refills | Status: DC

## 2019-03-17 MED ORDER — METOPROLOL TARTRATE 50 MG PO TABS: 50.0000 mg | ORAL_TABLET | Freq: Three times a day (TID) | ORAL | 3 refills | Status: DC

## 2019-03-17 NOTE — Assessment & Plan Note (Signed)

## 2019-03-17 NOTE — Telephone Encounter (Signed)
Spoke with representative at [facility] who states pt had an alert for rapid Afib for 60 seconds.  RN notified Eric Santos for Eric Drown, NP as pt is scheduled for an appt today at 1015am.  Awaiting faxed monitor alert report.

## 2019-03-17 NOTE — Patient Instructions (Signed)
Medication Instructions:   Your physician has recommended you make the following change in your medication:   1) Change your Diltiazem to Diltiazem CD 360MG , 1 capsule by mouth once a day  *If you need a refill on your cardiac medications before your next appointment, please call your pharmacy*  Lab Work:  None ordered today  If you have labs (blood work) drawn today and your tests are completely normal, you will receive your results only by: Marland Kitchen MyChart Message (if you have MyChart) OR . A paper copy in the mail If you have any lab test that is abnormal or we need to change your treatment, we will call you to review the results.  Testing/Procedures:  None ordered today  Follow-Up: At [facility], you and your health needs are our priority.  As part of our continuing mission to provide you with exceptional heart care, we have created designated Provider Care Teams.  These Care Teams include your primary Cardiologist (physician) and Advanced Practice Providers (APPs -  Physician Assistants and Nurse Practitioners) who all work together to provide you with the care you need, when you need it.  Your next appointment:    On 04/25/19 with Allegra Lai, MD
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
## 2019-03-17 NOTE — Telephone Encounter (Signed)
Eric Santos from [facility] calling with abnormal cardiac results. She states she needs to give verbal findings to a nurse.  She can be reached at: [phone]  Reference number: [id number]
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
## 2019-03-17 NOTE — Patient Instructions (Signed)
Please continue all other medications as before, and refills have been done if requested.  Please have the pharmacy call with any other refills you may need.  Please continue your efforts at being more active, low cholesterol diet, and weight control.  You are otherwise up to date with prevention measures today.  Please keep your appointments with your specialists as you may have planned  Please return in 6 months, or sooner if needed, with Lab testing done 3-5 days before at the [facility] - please make appt as you leave today for this

## 2019-03-17 NOTE — Assessment & Plan Note (Signed)
stable overall by history and exam, recent data reviewed with pt, and pt to continue medical treatment as before,  to f/u any worsening symptoms or concerns  

## 2019-03-19 NOTE — Telephone Encounter (Signed)
Patient saw Kathyrn Drown on 03/17/19 following a cardioversion on 03/03/19. He was seen by the atrial fibrillation clinic this day as well. Patient was in normal rhythm at both of these office visits. Monitor had been mailed back when this call came for episode of atrial fibrillation.

## 2019-03-22 DIAGNOSIS — I480 Paroxysmal atrial fibrillation: Secondary | ICD-10-CM | POA: Diagnosis not present

## 2019-03-22 DIAGNOSIS — G4733 Obstructive sleep apnea (adult) (pediatric): Secondary | ICD-10-CM | POA: Diagnosis not present

## 2019-03-22 DIAGNOSIS — E668 Other obesity: Secondary | ICD-10-CM | POA: Diagnosis not present

## 2019-03-22 DIAGNOSIS — E119 Type 2 diabetes mellitus without complications: Secondary | ICD-10-CM | POA: Diagnosis not present

## 2019-03-22 DIAGNOSIS — Z7984 Long term (current) use of oral hypoglycemic drugs: Secondary | ICD-10-CM | POA: Diagnosis not present

## 2019-03-22 DIAGNOSIS — Z7901 Long term (current) use of anticoagulants: Secondary | ICD-10-CM | POA: Diagnosis not present

## 2019-03-22 DIAGNOSIS — N390 Urinary tract infection, site not specified: Secondary | ICD-10-CM | POA: Diagnosis not present

## 2019-03-22 DIAGNOSIS — E785 Hyperlipidemia, unspecified: Secondary | ICD-10-CM | POA: Diagnosis not present

## 2019-03-22 DIAGNOSIS — T8453XD Infection and inflammatory reaction due to internal right knee prosthesis, subsequent encounter: Secondary | ICD-10-CM | POA: Diagnosis not present

## 2019-03-23 ENCOUNTER — Other Ambulatory Visit: Payer: Self-pay

## 2019-03-23 NOTE — Telephone Encounter (Signed)
Pt called in stating that he reached out to [facility] and they had not received a new prescription for Eliquis.

## 2019-03-24 DIAGNOSIS — Z7901 Long term (current) use of anticoagulants: Secondary | ICD-10-CM | POA: Diagnosis not present

## 2019-03-24 DIAGNOSIS — I480 Paroxysmal atrial fibrillation: Secondary | ICD-10-CM | POA: Diagnosis not present

## 2019-03-24 DIAGNOSIS — E785 Hyperlipidemia, unspecified: Secondary | ICD-10-CM | POA: Diagnosis not present

## 2019-03-24 DIAGNOSIS — G4733 Obstructive sleep apnea (adult) (pediatric): Secondary | ICD-10-CM | POA: Diagnosis not present

## 2019-03-24 DIAGNOSIS — T8453XD Infection and inflammatory reaction due to internal right knee prosthesis, subsequent encounter: Secondary | ICD-10-CM | POA: Diagnosis not present

## 2019-03-24 DIAGNOSIS — E668 Other obesity: Secondary | ICD-10-CM | POA: Diagnosis not present

## 2019-03-24 DIAGNOSIS — N390 Urinary tract infection, site not specified: Secondary | ICD-10-CM | POA: Diagnosis not present

## 2019-03-24 DIAGNOSIS — E119 Type 2 diabetes mellitus without complications: Secondary | ICD-10-CM | POA: Diagnosis not present

## 2019-03-24 DIAGNOSIS — Z7984 Long term (current) use of oral hypoglycemic drugs: Secondary | ICD-10-CM | POA: Diagnosis not present

## 2019-03-24 MED ORDER — APIXABAN 5 MG PO TABS: 5.0000 mg | ORAL_TABLET | Freq: Two times a day (BID) | ORAL | 1 refills | Status: DC

## 2019-03-24 NOTE — Telephone Encounter (Signed)
Last OV 03/17/2019 75 years old 110kg Scr 0.79 on 03/11/2019 Rx resent to [facility] for Eliquis

## 2019-04-07 DIAGNOSIS — E785 Hyperlipidemia, unspecified: Secondary | ICD-10-CM | POA: Diagnosis not present

## 2019-04-07 DIAGNOSIS — T8453XD Infection and inflammatory reaction due to internal right knee prosthesis, subsequent encounter: Secondary | ICD-10-CM | POA: Diagnosis not present

## 2019-04-07 DIAGNOSIS — Z7984 Long term (current) use of oral hypoglycemic drugs: Secondary | ICD-10-CM | POA: Diagnosis not present

## 2019-04-07 DIAGNOSIS — E668 Other obesity: Secondary | ICD-10-CM | POA: Diagnosis not present

## 2019-04-07 DIAGNOSIS — E119 Type 2 diabetes mellitus without complications: Secondary | ICD-10-CM | POA: Diagnosis not present

## 2019-04-07 DIAGNOSIS — Z7901 Long term (current) use of anticoagulants: Secondary | ICD-10-CM | POA: Diagnosis not present

## 2019-04-07 DIAGNOSIS — N390 Urinary tract infection, site not specified: Secondary | ICD-10-CM | POA: Diagnosis not present

## 2019-04-07 DIAGNOSIS — I480 Paroxysmal atrial fibrillation: Secondary | ICD-10-CM | POA: Diagnosis not present

## 2019-04-07 DIAGNOSIS — G4733 Obstructive sleep apnea (adult) (pediatric): Secondary | ICD-10-CM | POA: Diagnosis not present

## 2019-04-19 ENCOUNTER — Telehealth: Payer: Self-pay | Admitting: Cardiology

## 2019-04-19 ENCOUNTER — Telehealth: Payer: Self-pay

## 2019-04-19 MED ORDER — METOPROLOL TARTRATE 50 MG PO TABS: 50.0000 mg | ORAL_TABLET | Freq: Two times a day (BID) | ORAL | 3 refills | Status: DC

## 2019-04-19 NOTE — Telephone Encounter (Signed)
Spoke to pt, who hands phone to his wife to discuss. Advised to decrease Lopressor to 50 mg BID, per Dr. Curt Bears. Wife verbalized understanding and agreeable to plan, she will let him know (he is sitting right next to her).

## 2019-04-19 NOTE — Telephone Encounter (Signed)
Ok for pt to ask if the Freestyle Elenor [place]ato is covered by his insurance, otherwise my ordering would be futile

## 2019-04-19 NOTE — Telephone Encounter (Signed)
New message    The wife is calling looking for an option to check blood sugar verse no finger stick & not expensive.

## 2019-04-19 NOTE — Telephone Encounter (Signed)
New Message  Pt c/o medication issue:  1. Name of Medication: metoprolol tartrate (LOPRESSOR) 50 MG tablet(Expired)  2. How are you currently taking this medication (dosage and times per day)? Take 1 tablet (50 mg total) by mouth 3 (three) times daily.  3. Are you having a reaction (difficulty breathing--STAT)? Low heart rate 104/59 HR 46, 116/71 HR 56 , 111/67 HR 48, 115/70 HR 52  4. What is your medication issue? Patient has been experiencing low heart rate. Patient's wife states that for the past 2 nights, she has not given patient nightly dose due to HR being so low. Please call and advise.

## 2019-04-21 ENCOUNTER — Other Ambulatory Visit: Payer: Self-pay

## 2019-04-21 DIAGNOSIS — T8453XD Infection and inflammatory reaction due to internal right knee prosthesis, subsequent encounter: Secondary | ICD-10-CM | POA: Diagnosis not present

## 2019-04-21 DIAGNOSIS — Z7984 Long term (current) use of oral hypoglycemic drugs: Secondary | ICD-10-CM | POA: Diagnosis not present

## 2019-04-21 DIAGNOSIS — G4733 Obstructive sleep apnea (adult) (pediatric): Secondary | ICD-10-CM | POA: Diagnosis not present

## 2019-04-21 DIAGNOSIS — I480 Paroxysmal atrial fibrillation: Secondary | ICD-10-CM | POA: Diagnosis not present

## 2019-04-21 DIAGNOSIS — N390 Urinary tract infection, site not specified: Secondary | ICD-10-CM | POA: Diagnosis not present

## 2019-04-21 DIAGNOSIS — E119 Type 2 diabetes mellitus without complications: Secondary | ICD-10-CM | POA: Diagnosis not present

## 2019-04-21 DIAGNOSIS — E668 Other obesity: Secondary | ICD-10-CM | POA: Diagnosis not present

## 2019-04-21 DIAGNOSIS — Z7901 Long term (current) use of anticoagulants: Secondary | ICD-10-CM | POA: Diagnosis not present

## 2019-04-21 DIAGNOSIS — E785 Hyperlipidemia, unspecified: Secondary | ICD-10-CM | POA: Diagnosis not present

## 2019-04-21 NOTE — Telephone Encounter (Signed)
Spoke with patient and info given 

## 2019-04-21 NOTE — Telephone Encounter (Signed)
    Patient states he spoke with [facility], they will cover [place]. Please order

## 2019-04-22 MED ORDER — FREESTYLE LIBRE 14 DAY SENSOR MISC: 1.0000 | 3 refills | Status: DC

## 2019-04-22 MED ORDER — FREESTYLE LIBRE READER DEVI: 1.0000 | Freq: Every day | 0 refills | Status: DC

## 2019-04-22 NOTE — Addendum Note (Signed)
Addended by: Biagio Borg on: 04/22/2019 01:48 PM   Modules accepted: Orders

## 2019-04-22 NOTE — Telephone Encounter (Signed)
Done to local pharmacy

## 2019-04-26 ENCOUNTER — Ambulatory Visit (INDEPENDENT_AMBULATORY_CARE_PROVIDER_SITE_OTHER): Payer: Medicare PPO | Admitting: Cardiology

## 2019-04-26 ENCOUNTER — Encounter: Payer: Self-pay | Admitting: Cardiology

## 2019-04-26 ENCOUNTER — Other Ambulatory Visit: Payer: Self-pay

## 2019-04-26 VITALS — BP 120/62 | HR 51 | Ht 66.0 in | Wt 251.4 lb

## 2019-04-26 DIAGNOSIS — I4819 Other persistent atrial fibrillation: Secondary | ICD-10-CM

## 2019-04-26 MED ORDER — METOPROLOL TARTRATE 25 MG PO TABS: 25.0000 mg | ORAL_TABLET | Freq: Two times a day (BID) | ORAL | 1 refills | Status: DC

## 2019-04-26 NOTE — Patient Instructions (Signed)
Medication Instructions:  Your physician has recommended you make the following change in your medication:  1. DECREASE Metoprolol (Lopressor) to 25 mg twice daily  *If you need a refill on your cardiac medications before your next appointment, please call your pharmacy*  Lab Work: None ordered If you have labs (blood work) drawn today and your tests are completely normal, you will receive your results only by: Marland Kitchen MyChart Message (if you have MyChart) OR . A paper copy in the mail If you have any lab test that is abnormal or we need to change your treatment, we will call you to review the results.  Testing/Procedures: None ordered  Follow-Up: At [facility], you and your health needs are our priority.  As part of our continuing mission to provide you with exceptional heart care, we have created designated Provider Care Teams.  These Care Teams include your primary Cardiologist (physician) and Advanced Practice Providers (APPs -  Physician Assistants and Nurse Practitioners) who all work together to provide you with the care you need, when you need it.  Your next appointment:   6 month(s)  The format for your next appointment:   In Person  Provider:   Allegra Lai, MD  Thank you for choosing [facility]!!   Trinidad Curet, RN [phone]
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
## 2019-04-27 DIAGNOSIS — L820 Inflamed seborrheic keratosis: Secondary | ICD-10-CM | POA: Diagnosis not present

## 2019-04-27 DIAGNOSIS — L821 Other seborrheic keratosis: Secondary | ICD-10-CM | POA: Diagnosis not present

## 2019-04-27 DIAGNOSIS — L570 Actinic keratosis: Secondary | ICD-10-CM | POA: Diagnosis not present

## 2019-04-29 DIAGNOSIS — M1712 Unilateral primary osteoarthritis, left knee: Secondary | ICD-10-CM | POA: Diagnosis not present

## 2019-05-03 NOTE — Telephone Encounter (Signed)
New message:   Pt's wife is calling and states she needs this order resent to [place] sent to the [facility].

## 2019-05-04 ENCOUNTER — Other Ambulatory Visit: Payer: Self-pay

## 2019-05-04 MED ORDER — FREESTYLE LIBRE 14 DAY SENSOR MISC: 1.0000 | 3 refills | Status: DC

## 2019-05-04 NOTE — Telephone Encounter (Signed)
Resubmitted  Freestyle Libre to [facility].  Called patient to informed of requested change

## 2019-05-05 DIAGNOSIS — M67912 Unspecified disorder of synovium and tendon, left shoulder: Secondary | ICD-10-CM | POA: Diagnosis not present

## 2019-05-06 NOTE — Telephone Encounter (Signed)
[facility] sent PA for One touch ultra blue. Request to complete PA or change to Accu-Chek or True Metrix.   Will call patient to verify if the [facility] will be dispensed from [facility].

## 2019-05-07 NOTE — Telephone Encounter (Signed)
   Spouse called back, stating they plan to pay for Freestyle out of pocket.  She states she has already informed their pharmacy

## 2019-05-07 NOTE — Telephone Encounter (Signed)
lvm for pt to call back.   RE: Which meter does want: Accu Chek, True Metrix or Freestyle. Patient is not on insulin so the PA for the Freestyle will not be approved.

## 2019-05-08 NOTE — Telephone Encounter (Signed)
Noted     Closing encounter

## 2019-05-17 ENCOUNTER — Other Ambulatory Visit: Payer: Self-pay

## 2019-05-17 ENCOUNTER — Ambulatory Visit (INDEPENDENT_AMBULATORY_CARE_PROVIDER_SITE_OTHER): Payer: Medicare PPO | Admitting: Infectious Disease

## 2019-05-17 VITALS — BP 132/87 | HR 81 | Temp 97.9°F | Wt 248.0 lb

## 2019-05-17 DIAGNOSIS — I471 Supraventricular tachycardia: Secondary | ICD-10-CM

## 2019-05-17 DIAGNOSIS — I4891 Unspecified atrial fibrillation: Secondary | ICD-10-CM | POA: Diagnosis not present

## 2019-05-17 DIAGNOSIS — M00061 Staphylococcal arthritis, right knee: Secondary | ICD-10-CM

## 2019-05-17 DIAGNOSIS — T8453XD Infection and inflammatory reaction due to internal right knee prosthesis, subsequent encounter: Secondary | ICD-10-CM

## 2019-05-17 MED ORDER — MINOCYCLINE HCL 100 MG PO CAPS: 100.0000 mg | ORAL_CAPSULE | Freq: Two times a day (BID) | ORAL | 11 refills | Status: DC

## 2019-05-17 MED ORDER — CEPHALEXIN 500 MG PO CAPS: 1000.0000 mg | ORAL_CAPSULE | Freq: Two times a day (BID) | ORAL | 11 refills | Status: DC
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
## 2019-05-18 LAB — BASIC METABOLIC PANEL WITH GFR
BUN: 14 mg/dL (ref 7–25)
CO2: 30 mmol/L (ref 20–32)
Calcium: 9.6 mg/dL (ref 8.6–10.3)
Chloride: 106 mmol/L (ref 98–110)
Creat: 0.74 mg/dL (ref 0.70–1.18)
GFR, Est African American: 107 mL/min/{1.73_m2} (ref >=60)
GFR, Est Non African American: 92 mL/min/{1.73_m2} (ref >=60)
Glucose, Bld: 153 mg/dL — ABNORMAL HIGH (ref 65–99)
Potassium: 4.8 mmol/L (ref 3.5–5.3)
Sodium: 144 mmol/L (ref 135–146)

## 2019-05-18 LAB — C-REACTIVE PROTEIN: CRP: 4.4 mg/L (ref <8.0)

## 2019-05-18 LAB — SEDIMENTATION RATE: Sed Rate: 9 mm/h (ref 0–20)

## 2019-05-20 NOTE — Patient Instructions (Addendum)

## 2019-05-21 NOTE — Telephone Encounter (Signed)
Fax request for [facility]
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
## 2019-05-24 ENCOUNTER — Telehealth (INDEPENDENT_AMBULATORY_CARE_PROVIDER_SITE_OTHER): Payer: Medicare PPO | Admitting: Cardiovascular Disease

## 2019-05-24 ENCOUNTER — Other Ambulatory Visit: Payer: Self-pay

## 2019-05-24 ENCOUNTER — Encounter: Payer: Self-pay | Admitting: Cardiovascular Disease

## 2019-05-24 VITALS — BP 117/76 | HR 65 | Wt 245.0 lb

## 2019-05-24 DIAGNOSIS — I10 Essential (primary) hypertension: Secondary | ICD-10-CM

## 2019-05-24 DIAGNOSIS — I480 Paroxysmal atrial fibrillation: Secondary | ICD-10-CM

## 2019-05-24 DIAGNOSIS — I2510 Atherosclerotic heart disease of native coronary artery without angina pectoris: Secondary | ICD-10-CM

## 2019-05-24 DIAGNOSIS — G4733 Obstructive sleep apnea (adult) (pediatric): Secondary | ICD-10-CM

## 2019-05-26 DIAGNOSIS — G4733 Obstructive sleep apnea (adult) (pediatric): Secondary | ICD-10-CM | POA: Diagnosis not present

## 2019-06-07 ENCOUNTER — Other Ambulatory Visit: Payer: Self-pay

## 2019-06-07 ENCOUNTER — Telehealth: Payer: Self-pay | Admitting: Internal Medicine

## 2019-06-07 ENCOUNTER — Telehealth: Payer: Self-pay

## 2019-06-07 MED ORDER — ACCU-CHEK GUIDE VI STRP: ORAL_STRIP | 12 refills | Status: DC

## 2019-06-07 MED ORDER — ACCU-CHEK GUIDE W/DEVICE KIT: 1.0000 | PACK | Freq: Every day | 0 refills | Status: DC

## 2019-06-07 NOTE — Telephone Encounter (Signed)
Ok sent to [facility]

## 2019-06-07 NOTE — Telephone Encounter (Signed)
Sent requested prescription to [facility]

## 2019-06-07 NOTE — Telephone Encounter (Signed)
New message:   Pt's wife is calling and states a request was sent from [facility] for the patient to get a new Freestyle Libra machine and he will need new prescription for strips.    [place] [phone]

## 2019-06-07 NOTE — Telephone Encounter (Signed)
Chief Strategy Officer.   Insurance covers [employer].  Please resubmit a new prescription

## 2019-06-08 MED ORDER — FREESTYLE LIBRE 14 DAY SENSOR MISC: 11 refills | Status: DC

## 2019-06-08 MED ORDER — FREESTYLE LIBRE READER DEVI: 1.0000 | Freq: Two times a day (BID) | 0 refills | Status: AC

## 2019-06-08 NOTE — Telephone Encounter (Signed)
   Spouse calling to report device and sensors are both needed. Fax [phone]

## 2019-06-08 NOTE — Telephone Encounter (Signed)
Called pt to verify if he need device & sensors. There was no answer LMOM RTC.Marland KitchenJohny Chess

## 2019-06-09 NOTE — Telephone Encounter (Signed)
MD sign scripts faxed to [facility] # below.Marland KitchenJohny Chess

## 2019-06-09 NOTE — Telephone Encounter (Signed)
Caller Name: Eric Santos w/[facility] Phone: [phone] Fax: [phone]  Eric Santos was routed to [facility] office when dialing option 2 in the phone tree. She notes that she received form this morning but #4 is not completed for the pts DX. Please update and refax form.

## 2019-06-09 NOTE — Telephone Encounter (Signed)
Called Eric Santos back w/[facility] she was at lunch spoke w/another representative. She states they are needing a Dx code on the order that was faxed on 06/03/19, and 06/07/19. Inform rep to refax order to [facility] [name]) to add dx. Previous order has not been scan in pt chart. She is refaxing order.Marland KitchenJohny Chess

## 2019-06-10 NOTE — Telephone Encounter (Signed)
Rec'd order.. completed again and refaxed to [facility].Marland KitchenJohny Chess

## 2019-06-14 ENCOUNTER — Other Ambulatory Visit: Payer: Self-pay | Admitting: Internal Medicine

## 2019-06-14 MED ORDER — ACCU-CHEK GUIDE VI STRP: ORAL_STRIP | 12 refills | Status: DC

## 2019-06-14 NOTE — Telephone Encounter (Signed)
erx was sent in on 06/07/2019.   Resent per pt request.

## 2019-06-14 NOTE — Telephone Encounter (Signed)
New message:   Pt's wife is calling and states the pt needs a prescription for test strips for his accu meter. Needs to go to [facility] mail order pharmacy. Please advise.

## 2019-06-23 DIAGNOSIS — E1065 Type 1 diabetes mellitus with hyperglycemia: Secondary | ICD-10-CM | POA: Diagnosis not present

## 2019-07-05 ENCOUNTER — Telehealth: Payer: Self-pay

## 2019-07-05 NOTE — Telephone Encounter (Signed)
Faxed last office visit note from 03-17-2019 to [facility] at [phone]

## 2019-07-14 ENCOUNTER — Other Ambulatory Visit: Payer: Self-pay

## 2019-07-14 MED ORDER — DILTIAZEM HCL ER COATED BEADS 360 MG PO CP24: 360.0000 mg | ORAL_CAPSULE | Freq: Every day | ORAL | 3 refills | Status: DC

## 2019-07-14 NOTE — Telephone Encounter (Signed)
Pt's medication was sent to pt's pharmacy as requested. Confirmation received.  °

## 2019-08-03 DIAGNOSIS — M25561 Pain in right knee: Secondary | ICD-10-CM | POA: Diagnosis not present

## 2019-08-03 DIAGNOSIS — M25461 Effusion, right knee: Secondary | ICD-10-CM | POA: Diagnosis not present

## 2019-08-03 DIAGNOSIS — Z96651 Presence of right artificial knee joint: Secondary | ICD-10-CM | POA: Diagnosis not present

## 2019-08-27 ENCOUNTER — Other Ambulatory Visit: Payer: Self-pay

## 2019-08-27 ENCOUNTER — Encounter: Payer: Self-pay | Admitting: Cardiology

## 2019-08-27 ENCOUNTER — Telehealth (INDEPENDENT_AMBULATORY_CARE_PROVIDER_SITE_OTHER): Payer: Medicare PPO | Admitting: Cardiology

## 2019-08-27 VITALS — BP 115/78 | HR 80 | Temp 98.1°F | Ht 66.0 in | Wt 250.0 lb

## 2019-08-27 DIAGNOSIS — G4733 Obstructive sleep apnea (adult) (pediatric): Secondary | ICD-10-CM
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
## 2019-08-31 ENCOUNTER — Telehealth: Payer: Self-pay | Admitting: *Deleted

## 2019-08-31 DIAGNOSIS — G4733 Obstructive sleep apnea (adult) (pediatric): Secondary | ICD-10-CM

## 2019-08-31 NOTE — Telephone Encounter (Signed)
Order placed to [facility] via community message 

## 2019-08-31 NOTE — Telephone Encounter (Signed)
-----   Message from Sueanne Margarita, MD sent at 08/27/2019  9:54 AM EDT ----- Please make an in person visit with [employer] to get his mask fitted.  His mask is new and does not fit had leaks very badly resulting in an elevated AHI.  Please change BiPAP to auto with IPAP 20cm and EPAP min 5cm  and PS 4cm H2O.  Get a download in 4 weeks.  FOllouwp with me in 6 months

## 2019-09-08 ENCOUNTER — Ambulatory Visit: Payer: Medicare PPO | Admitting: Cardiovascular Disease

## 2019-09-09 ENCOUNTER — Encounter: Payer: Self-pay | Admitting: Infectious Disease

## 2019-09-09 ENCOUNTER — Ambulatory Visit: Payer: Medicare PPO | Admitting: Infectious Disease

## 2019-09-09 ENCOUNTER — Other Ambulatory Visit: Payer: Self-pay

## 2019-09-09 VITALS — BP 134/83 | HR 65 | Temp 97.7°F | Wt 243.0 lb

## 2019-09-09 DIAGNOSIS — M00061 Staphylococcal arthritis, right knee: Secondary | ICD-10-CM

## 2019-09-09 DIAGNOSIS — T8453XD Infection and inflammatory reaction due to internal right knee prosthesis, subsequent encounter: Secondary | ICD-10-CM

## 2019-09-09 DIAGNOSIS — E1169 Type 2 diabetes mellitus with other specified complication: Secondary | ICD-10-CM

## 2019-09-09 DIAGNOSIS — L270 Generalized skin eruption due to drugs and medicaments taken internally: Secondary | ICD-10-CM | POA: Diagnosis not present

## 2019-09-09 HISTORY — DX: Generalized skin eruption due to drugs and medicaments taken internally: L27.0

## 2019-09-09 MED ORDER — CEPHALEXIN 500 MG PO CAPS: 1000.0000 mg | ORAL_CAPSULE | Freq: Two times a day (BID) | ORAL | 4 refills | Status: DC

## 2019-09-09 MED ORDER — SULFAMETHOXAZOLE-TRIMETHOPRIM 800-160 MG PO TABS
1.0000 | ORAL_TABLET | Freq: Two times a day (BID) | ORAL | 11 refills | Status: DC
Start: 2019-09-09 — End: 2019-11-15
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
## 2019-09-10 DIAGNOSIS — G4733 Obstructive sleep apnea (adult) (pediatric): Secondary | ICD-10-CM | POA: Diagnosis not present

## 2019-09-10 LAB — BASIC METABOLIC PANEL WITH GFR
BUN: 16 mg/dL (ref 7–25)
CO2: 28 mmol/L (ref 20–32)
Calcium: 9.9 mg/dL (ref 8.6–10.3)
Chloride: 109 mmol/L (ref 98–110)
Creat: 0.89 mg/dL (ref 0.70–1.18)
GFR, Est African American: 99 mL/min/{1.73_m2} (ref >=60)
GFR, Est Non African American: 85 mL/min/{1.73_m2} (ref >=60)
Glucose, Bld: 100 mg/dL — ABNORMAL HIGH (ref 65–99)
Potassium: 4.8 mmol/L (ref 3.5–5.3)
Sodium: 143 mmol/L (ref 135–146)

## 2019-09-10 LAB — SED RATE MANUAL WEST RFLX: SED RATE BY MODIFIED WESTERGREN,MANUAL: 5 mm/h (ref 0–20)

## 2019-09-10 LAB — C-REACTIVE PROTEIN: CRP: 6.7 mg/L (ref <8.0)

## 2019-09-10 LAB — SEDIMENTATION RATE

## 2019-09-13 ENCOUNTER — Other Ambulatory Visit (INDEPENDENT_AMBULATORY_CARE_PROVIDER_SITE_OTHER): Payer: Medicare PPO

## 2019-09-13 ENCOUNTER — Other Ambulatory Visit: Payer: Self-pay

## 2019-09-13 DIAGNOSIS — E785 Hyperlipidemia, unspecified: Secondary | ICD-10-CM

## 2019-09-13 DIAGNOSIS — E1169 Type 2 diabetes mellitus with other specified complication: Secondary | ICD-10-CM | POA: Diagnosis not present

## 2019-09-13 DIAGNOSIS — R972 Elevated prostate specific antigen [PSA]: Secondary | ICD-10-CM

## 2019-09-13 LAB — BASIC METABOLIC PANEL
BUN: 13 mg/dL (ref 6–23)
CO2: 29 mEq/L (ref 19–32)
Calcium: 9.6 mg/dL (ref 8.4–10.5)
Chloride: 103 mEq/L (ref 96–112)
Creatinine, Ser: 0.92 mg/dL (ref 0.40–1.50)
GFR: 80.71 mL/min (ref >60.00)
Glucose, Bld: 119 mg/dL — ABNORMAL HIGH (ref 70–99)
Potassium: 5.1 mEq/L (ref 3.5–5.1)
Sodium: 140 mEq/L (ref 135–145)

## 2019-09-13 LAB — LIPID PANEL
Cholesterol: 117 mg/dL (ref 0–200)
HDL: 36.9 mg/dL — ABNORMAL LOW (ref >39.00)
LDL Cholesterol: 55 mg/dL (ref 0–99)
NonHDL: 80.02
Total CHOL/HDL Ratio: 3
Triglycerides: 123 mg/dL (ref 0.0–149.0)
VLDL: 24.6 mg/dL (ref 0.0–40.0)

## 2019-09-13 LAB — HEPATIC FUNCTION PANEL
ALT: 16 U/L (ref 0–53)
AST: 17 U/L (ref 0–37)
Albumin: 4.3 g/dL (ref 3.5–5.2)
Alkaline Phosphatase: 80 U/L (ref 39–117)
Bilirubin, Direct: 0.1 mg/dL (ref 0.0–0.3)
Total Bilirubin: 0.6 mg/dL (ref 0.2–1.2)
Total Protein: 6.3 g/dL (ref 6.0–8.3)

## 2019-09-13 LAB — HEMOGLOBIN A1C: Hgb A1c MFr Bld: 6.3 % (ref 4.6–6.5)

## 2019-09-13 LAB — PSA: PSA: 1.17 ng/mL (ref 0.10–4.00)

## 2019-09-14 ENCOUNTER — Encounter: Payer: Self-pay | Admitting: Physician Assistant

## 2019-09-14 ENCOUNTER — Other Ambulatory Visit: Payer: Self-pay

## 2019-09-14 ENCOUNTER — Ambulatory Visit: Payer: Medicare PPO | Admitting: Physician Assistant

## 2019-09-14 VITALS — BP 118/68 | HR 64 | Ht 66.0 in | Wt 257.0 lb

## 2019-09-14 DIAGNOSIS — I480 Paroxysmal atrial fibrillation: Secondary | ICD-10-CM

## 2019-09-14 DIAGNOSIS — I10 Essential (primary) hypertension: Secondary | ICD-10-CM | POA: Diagnosis not present

## 2019-09-14 DIAGNOSIS — I2510 Atherosclerotic heart disease of native coronary artery without angina pectoris: Secondary | ICD-10-CM | POA: Diagnosis not present

## 2019-09-14 DIAGNOSIS — E785 Hyperlipidemia, unspecified: Secondary | ICD-10-CM

## 2019-09-14 DIAGNOSIS — G4733 Obstructive sleep apnea (adult) (pediatric): Secondary | ICD-10-CM

## 2019-09-14 NOTE — Patient Instructions (Signed)
Medication Instructions:  Your physician recommends that you continue on your current medications as directed. Please refer to the Current Medication list given to you today.  *If you need a refill on your cardiac medications before your next appointment, please call your pharmacy*   Lab Work: None If you have labs (blood work) drawn today and your tests are completely normal, you will receive your results only by: Marland Kitchen MyChart Message (if you have MyChart) OR . A paper copy in the mail If you have any lab test that is abnormal or we need to change your treatment, we will call you to review the results.   Testing/Procedures: None   Follow-Up: At [facility], you and your health needs are our priority.  As part of our continuing mission to provide you with exceptional heart care, we have created designated Provider Care Teams.  These Care Teams include your primary Cardiologist (physician) and Advanced Practice Providers (APPs -  Physician Assistants and Nurse Practitioners) who all work together to provide you with the care you need, when you need it.  We recommend signing up for the patient portal called "MyChart".  Sign up information is provided on this After Visit Summary.  MyChart is used to connect with patients for Virtual Visits (Telemedicine).  Patients are able to view lab/test results, encounter notes, upcoming appointments, etc.  Non-urgent messages can be sent to your provider as well.   To learn more about what you can do with MyChart, go to [email or web address].    Your next appointment:   01/24/2020 @ 8:00am with Dr Johnsie Cancel   Other Instructions You may take an extra Metoprolol if Heart Rate is above 100 or if you are having symptoms.

## 2019-09-15 ENCOUNTER — Ambulatory Visit: Payer: Medicare PPO | Admitting: Infectious Disease

## 2019-09-16 ENCOUNTER — Other Ambulatory Visit: Payer: Self-pay

## 2019-09-16 ENCOUNTER — Ambulatory Visit: Payer: Medicare PPO | Admitting: Internal Medicine

## 2019-09-16 ENCOUNTER — Ambulatory Visit (INDEPENDENT_AMBULATORY_CARE_PROVIDER_SITE_OTHER): Payer: Medicare PPO

## 2019-09-16 VITALS — BP 124/80 | HR 59 | Temp 98.2°F | Resp 16 | Ht 66.0 in | Wt 256.6 lb

## 2019-09-16 DIAGNOSIS — E1065 Type 1 diabetes mellitus with hyperglycemia: Secondary | ICD-10-CM | POA: Diagnosis not present

## 2019-09-16 DIAGNOSIS — M25461 Effusion, right knee: Secondary | ICD-10-CM | POA: Diagnosis not present

## 2019-09-16 DIAGNOSIS — Z0000 Encounter for general adult medical examination without abnormal findings: Secondary | ICD-10-CM
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
## 2019-09-16 NOTE — Patient Instructions (Addendum)
Mr. Eric Santos , Thank you for taking time to come for your Medicare Wellness Visit. I appreciate your ongoing commitment to your health goals. Please review the following plan we discussed and let me know if I can assist you in the future.   Screening recommendations/referrals: Colonoscopy: 01/13/2014; due every 10 years Recommended yearly ophthalmology/optometry visit for glaucoma screening and checkup Recommended yearly dental visit for hygiene and checkup  Vaccinations: Influenza vaccine: 03/03/2019 Pneumococcal vaccine: completed Tdap vaccine: 01/22/2018; due every 10 years Shingles vaccine: never done   Covid-19: completed  Advanced directives: Please bring a copy of your health care power of attorney and living will to the office at your convenience.   Conditions/risks identified: Please continue to do your personal lifestyle choices by: daily care of teeth and gums, regular physical activity (goal should be 5 days a week for 30 minutes), eat a healthy diet, avoid tobacco and drug use, limiting any alcohol intake, taking a low-dose aspirin (if not allergic or have been advised by your provider otherwise) and taking vitamins and minerals as recommended by your provider. Continue doing brain stimulating activities (puzzles, reading, adult coloring books, staying active) to keep memory sharp. Continue to eat heart healthy diet (full of fruits, vegetables, whole grains, lean protein, water--limit salt, fat, and sugar intake) and increase physical activity as tolerated.  Next appointment: Please schedule your next Medicare Wellness Visit with your Nurse Health Advisor in 1 year.  Preventive Care 70 Years and Older, Male Preventive care refers to lifestyle choices and visits with your health care provider that can promote health and wellness. What does preventive care include?  A yearly physical exam. This is also called an annual well check.  Dental exams once or twice a year.  Routine eye  exams. Ask your health care provider how often you should have your eyes checked.  Personal lifestyle choices, including:  Daily care of your teeth and gums.  Regular physical activity.  Eating a healthy diet.  Avoiding tobacco and drug use.  Limiting alcohol use.  Practicing safe sex.  Taking low doses of aspirin every day.  Taking vitamin and mineral supplements as recommended by your health care provider. What happens during an annual well check? The services and screenings done by your health care provider during your annual well check will depend on your age, overall health, lifestyle risk factors, and family history of disease. Counseling  Your health care provider may ask you questions about your:  Alcohol use.  Tobacco use.  Drug use.  Emotional well-being.  Home and relationship well-being.  Sexual activity.  Eating habits.  History of falls.  Memory and ability to understand (cognition).  Work and work Statistician. Screening  You may have the following tests or measurements:  Height, weight, and BMI.  Blood pressure.  Lipid and cholesterol levels. These may be checked every 5 years, or more frequently if you are over 53 years old.  Skin check.  Lung cancer screening. You may have this screening every year starting at age 40 if you have a 30-pack-year history of smoking and currently smoke or have quit within the past 15 years.  Fecal occult blood test (FOBT) of the stool. You may have this test every year starting at age 31.  Flexible sigmoidoscopy or colonoscopy. You may have a sigmoidoscopy every 5 years or a colonoscopy every 10 years starting at age 40.  Prostate cancer screening. Recommendations will vary depending on your family history and other risks.  Hepatitis C  blood test.  Hepatitis B blood test.  Sexually transmitted disease (STD) testing.  Diabetes screening. This is done by checking your blood sugar (glucose) after you have  not eaten for a while (fasting). You may have this done every 1-3 years.  Abdominal aortic aneurysm (AAA) screening. You may need this if you are a current or former smoker.  Osteoporosis. You may be screened starting at age 71 if you are at high risk. Talk with your health care provider about your test results, treatment options, and if necessary, the need for more tests. Vaccines  Your health care provider may recommend certain vaccines, such as:  Influenza vaccine. This is recommended every year.  Tetanus, diphtheria, and acellular pertussis (Tdap, Td) vaccine. You may need a Td booster every 10 years.  Zoster vaccine. You may need this after age 36.  Pneumococcal 13-valent conjugate (PCV13) vaccine. One dose is recommended after age 64.  Pneumococcal polysaccharide (PPSV23) vaccine. One dose is recommended after age 59. Talk to your health care provider about which screenings and vaccines you need and how often you need them. This information is not intended to replace advice given to you by your health care provider. Make sure you discuss any questions you have with your health care provider. Document Released: 03/17/2015 Document Revised: 11/08/2015 Document Reviewed: 12/20/2014 Elsevier Interactive Patient Education  2017 [employer] Prevention in the Home Falls can cause injuries. They can happen to people of all ages. There are many things you can do to make your home safe and to help prevent falls. What can I do on the outside of my home?  Regularly fix the edges of walkways and driveways and fix any cracks.  Remove anything that might make you trip as you walk through a door, such as a raised step or threshold.  Trim any bushes or trees on the path to your home.  Use bright outdoor lighting.  Clear any walking paths of anything that might make someone trip, such as rocks or tools.  Regularly check to see if handrails are loose or broken. Make sure that both  sides of any steps have handrails.  Any raised decks and porches should have guardrails on the edges.  Have any leaves, snow, or ice cleared regularly.  Use sand or salt on walking paths during winter.  Clean up any spills in your garage right away. This includes oil or grease spills. What can I do in the bathroom?  Use night lights.  Install grab bars by the toilet and in the tub and shower. Do not use towel bars as grab bars.  Use non-skid mats or decals in the tub or shower.  If you need to sit down in the shower, use a plastic, non-slip stool.  Keep the floor dry. Clean up any water that spills on the floor as soon as it happens.  Remove soap buildup in the tub or shower regularly.  Attach bath mats securely with double-sided non-slip rug tape.  Do not have throw rugs and other things on the floor that can make you trip. What can I do in the bedroom?  Use night lights.  Make sure that you have a light by your bed that is easy to reach.  Do not use any sheets or blankets that are too big for your bed. They should not hang down onto the floor.  Have a firm chair that has side arms. You can use this for support while you get dressed.  Do not have throw rugs and other things on the floor that can make you trip. What can I do in the kitchen?  Clean up any spills right away.  Avoid walking on wet floors.  Keep items that you use a lot in easy-to-reach places.  If you need to reach something above you, use a strong step stool that has a grab bar.  Keep electrical cords out of the way.  Do not use floor polish or wax that makes floors slippery. If you must use wax, use non-skid floor wax.  Do not have throw rugs and other things on the floor that can make you trip. What can I do with my stairs?  Do not leave any items on the stairs.  Make sure that there are handrails on both sides of the stairs and use them. Fix handrails that are broken or loose. Make sure that  handrails are as long as the stairways.  Check any carpeting to make sure that it is firmly attached to the stairs. Fix any carpet that is loose or worn.  Avoid having throw rugs at the top or bottom of the stairs. If you do have throw rugs, attach them to the floor with carpet tape.  Make sure that you have a light switch at the top of the stairs and the bottom of the stairs. If you do not have them, ask someone to add them for you. What else can I do to help prevent falls?  Wear shoes that:  Do not have high heels.  Have rubber bottoms.  Are comfortable and fit you well.  Are closed at the toe. Do not wear sandals.  If you use a stepladder:  Make sure that it is fully opened. Do not climb a closed stepladder.  Make sure that both sides of the stepladder are locked into place.  Ask someone to hold it for you, if possible.  Clearly mark and make sure that you can see:  Any grab bars or handrails.  First and last steps.  Where the edge of each step is.  Use tools that help you move around (mobility aids) if they are needed. These include:  Canes.  Walkers.  Scooters.  Crutches.  Turn on the lights when you go into a dark area. Replace any light bulbs as soon as they burn out.  Set up your furniture so you have a clear path. Avoid moving your furniture around.  If any of your floors are uneven, fix them.  If there are any pets around you, be aware of where they are.  Review your medicines with your doctor. Some medicines can make you feel dizzy. This can increase your chance of falling. Ask your doctor what other things that you can do to help prevent falls. This information is not intended to replace advice given to you by your health care provider. Make sure you discuss any questions you have with your health care provider. Document Released: 12/15/2008 Document Revised: 07/27/2015 Document Reviewed: 03/25/2014 Elsevier Interactive Patient Education  2017  [employer].

## 2019-09-17 ENCOUNTER — Other Ambulatory Visit: Payer: Self-pay

## 2019-09-17 ENCOUNTER — Encounter: Payer: Self-pay | Admitting: Internal Medicine

## 2019-09-17 ENCOUNTER — Ambulatory Visit (INDEPENDENT_AMBULATORY_CARE_PROVIDER_SITE_OTHER): Payer: Medicare PPO | Admitting: Internal Medicine

## 2019-09-17 VITALS — BP 100/70 | HR 62 | Temp 98.1°F | Ht 66.0 in | Wt 254.0 lb

## 2019-09-17 DIAGNOSIS — Z0000 Encounter for general adult medical examination without abnormal findings: Secondary | ICD-10-CM

## 2019-09-17 DIAGNOSIS — R7302 Impaired glucose tolerance (oral): Secondary | ICD-10-CM | POA: Diagnosis not present

## 2019-09-17 DIAGNOSIS — K219 Gastro-esophageal reflux disease without esophagitis: Secondary | ICD-10-CM

## 2019-09-17 DIAGNOSIS — E538 Deficiency of other specified B group vitamins: Secondary | ICD-10-CM | POA: Diagnosis not present

## 2019-09-17 DIAGNOSIS — E785 Hyperlipidemia, unspecified: Secondary | ICD-10-CM

## 2019-09-17 DIAGNOSIS — E1169 Type 2 diabetes mellitus with other specified complication: Secondary | ICD-10-CM

## 2019-09-17 DIAGNOSIS — E559 Vitamin D deficiency, unspecified: Secondary | ICD-10-CM | POA: Diagnosis not present

## 2019-09-17 NOTE — Patient Instructions (Signed)
Please continue all other medications as before, and refills have been done if requested.  Please have the pharmacy call with any other refills you may need.  Please continue your efforts at being more active, low cholesterol diet, and weight control.  Please keep your appointments with your specialists as you may have planned  Please make an Appointment to return in 6 months, or sooner if needed, also with Lab Appointment for testing done 3-5 days before at the FIRST FLOOR Lab (so this is for TWO appointments - please see the scheduling desk as you leave)  
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
## 2019-09-18 ENCOUNTER — Encounter: Payer: Self-pay | Admitting: Internal Medicine

## 2019-09-18 NOTE — Assessment & Plan Note (Signed)
stable overall by history and exam, recent data reviewed with pt, and pt to continue medical treatment as before,  to f/u any worsening symptoms or concerns  

## 2019-09-18 NOTE — Assessment & Plan Note (Addendum)

## 2019-09-29 DIAGNOSIS — E119 Type 2 diabetes mellitus without complications: Secondary | ICD-10-CM | POA: Diagnosis not present

## 2019-10-05 ENCOUNTER — Other Ambulatory Visit: Payer: Self-pay | Admitting: Cardiology

## 2019-10-06 ENCOUNTER — Telehealth: Payer: Self-pay | Admitting: Cardiology

## 2019-10-06 NOTE — Telephone Encounter (Signed)
Pt last saw Ermalinda Barrios, PA on 09/14/19, last labs 09/13/19 Creat 0.92, age 74, weight 115.2kg, based on specified criteri pt is on appropriate dosage of Eliquis 5mg  BID.  Will refill rx.

## 2019-10-06 NOTE — Telephone Encounter (Signed)
° ° °  Pt's wife calling to follow up ELIQUIS 5 MG TABS tablet refill request. Advised prescription sent today.

## 2019-10-20 ENCOUNTER — Other Ambulatory Visit: Payer: Self-pay | Admitting: Cardiology

## 2019-10-21 DIAGNOSIS — Z01818 Encounter for other preprocedural examination: Secondary | ICD-10-CM | POA: Diagnosis not present

## 2019-10-21 DIAGNOSIS — T84092A Other mechanical complication of internal right knee prosthesis, initial encounter: Secondary | ICD-10-CM | POA: Diagnosis not present

## 2019-10-21 DIAGNOSIS — M25561 Pain in right knee: Secondary | ICD-10-CM | POA: Diagnosis not present

## 2019-10-21 DIAGNOSIS — R7303 Prediabetes: Secondary | ICD-10-CM | POA: Diagnosis not present

## 2019-10-25 DIAGNOSIS — T84092A Other mechanical complication of internal right knee prosthesis, initial encounter: Secondary | ICD-10-CM | POA: Diagnosis not present

## 2019-10-29 DIAGNOSIS — L728 Other follicular cysts of the skin and subcutaneous tissue: Secondary | ICD-10-CM | POA: Diagnosis not present

## 2019-10-29 DIAGNOSIS — D1801 Hemangioma of skin and subcutaneous tissue: Secondary | ICD-10-CM | POA: Diagnosis not present

## 2019-10-29 DIAGNOSIS — L820 Inflamed seborrheic keratosis: Secondary | ICD-10-CM | POA: Diagnosis not present

## 2019-11-12 ENCOUNTER — Telehealth: Payer: Self-pay

## 2019-11-12 NOTE — Telephone Encounter (Signed)
COVID-19 Pre-Screening Questions:11/12/19  Do you currently have a fever (>100 F), chills or unexplained body aches?NO  Are you currently experiencing new cough, shortness of breath, sore throat, runny nose? NO  Have you been in contact with someone that is currently pending confirmation of Covid19 testing or has been confirmed to have the [employer] virus? NO  I have advised patient to call back to reschedule or convert to a MyChart visit if any Covid symptoms appear during the weekend.  **If the patient answers NO to ALL questions -  advise the patient to please call the clinic before coming to the office should any symptoms develop.

## 2019-11-15 ENCOUNTER — Encounter: Payer: Self-pay | Admitting: Infectious Disease

## 2019-11-15 ENCOUNTER — Ambulatory Visit: Payer: Medicare PPO | Admitting: Infectious Disease

## 2019-11-15 ENCOUNTER — Other Ambulatory Visit: Payer: Self-pay

## 2019-11-15 VITALS — BP 127/80 | HR 75 | Temp 97.5°F | Resp 17 | Ht 66.0 in | Wt 259.4 lb

## 2019-11-15 DIAGNOSIS — M00061 Staphylococcal arthritis, right knee: Secondary | ICD-10-CM

## 2019-11-15 DIAGNOSIS — Z23 Encounter for immunization: Secondary | ICD-10-CM

## 2019-11-15 DIAGNOSIS — L270 Generalized skin eruption due to drugs and medicaments taken internally: Secondary | ICD-10-CM

## 2019-11-15 DIAGNOSIS — T8453XD Infection and inflammatory reaction due to internal right knee prosthesis, subsequent encounter: Secondary | ICD-10-CM | POA: Diagnosis not present

## 2019-11-15 MED ORDER — CEPHALEXIN 500 MG PO CAPS: 1000.0000 mg | ORAL_CAPSULE | Freq: Two times a day (BID) | ORAL | 11 refills | Status: DC

## 2019-11-15 MED ORDER — SULFAMETHOXAZOLE-TRIMETHOPRIM 800-160 MG PO TABS
1.0000 | ORAL_TABLET | Freq: Two times a day (BID) | ORAL | 11 refills | Status: DC
Start: 2019-11-15 — End: 2020-02-21
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
## 2019-11-16 LAB — CBC WITH DIFFERENTIAL/PLATELET
Absolute Monocytes: 1154 cells/uL — ABNORMAL HIGH (ref 200–950)
Basophils Absolute: 49 cells/uL (ref 0–200)
Basophils Relative: 0.5 %
Eosinophils Absolute: 165 cells/uL (ref 15–500)
Eosinophils Relative: 1.7 %
HCT: 41.7 % (ref 38.5–50.0)
Hemoglobin: 13.9 g/dL (ref 13.2–17.1)
Lymphs Abs: 1678 cells/uL (ref 850–3900)
MCH: 28.8 pg (ref 27.0–33.0)
MCHC: 33.3 g/dL (ref 32.0–36.0)
MCV: 86.3 fL (ref 80.0–100.0)
MPV: 10.5 fL (ref 7.5–12.5)
Monocytes Relative: 11.9 %
Neutro Abs: 6654 cells/uL (ref 1500–7800)
Neutrophils Relative %: 68.6 %
Platelets: 384 10*3/uL (ref 140–400)
RBC: 4.83 10*6/uL (ref 4.20–5.80)
RDW: 12.6 % (ref 11.0–15.0)
Total Lymphocyte: 17.3 %
WBC: 9.7 10*3/uL (ref 3.8–10.8)

## 2019-11-16 LAB — BASIC METABOLIC PANEL WITH GFR
BUN: 14 mg/dL (ref 7–25)
CO2: 29 mmol/L (ref 20–32)
Calcium: 9.4 mg/dL (ref 8.6–10.3)
Chloride: 103 mmol/L (ref 98–110)
Creat: 0.82 mg/dL (ref 0.70–1.18)
GFR, Est African American: 102 mL/min/{1.73_m2} (ref >=60)
GFR, Est Non African American: 88 mL/min/{1.73_m2} (ref >=60)
Glucose, Bld: 127 mg/dL — ABNORMAL HIGH (ref 65–99)
Potassium: 5.2 mmol/L (ref 3.5–5.3)
Sodium: 139 mmol/L (ref 135–146)

## 2019-11-16 LAB — C-REACTIVE PROTEIN: CRP: 58.1 mg/L — ABNORMAL HIGH (ref <8.0)

## 2019-11-16 LAB — SEDIMENTATION RATE: Sed Rate: 60 mm/h — ABNORMAL HIGH (ref 0–20)

## 2019-11-22 ENCOUNTER — Telehealth: Payer: Self-pay | Admitting: *Deleted

## 2019-11-22 NOTE — Telephone Encounter (Signed)
-----   Message from [facility] sent at 11/22/2019 10:58 AM EDT ----- Regarding: labs Patient called concerned about lab results that were taken last week. He believe its due to medication changed ( sulfamethoxazole-trimethoprim ) if someone could give him a call at [phone] and leave a voice mail if they do not answer regarding labs.

## 2019-11-22 NOTE — Telephone Encounter (Signed)
RN attempted to return call, went to voicemail.  Please advise on lab results/meaning with patient's new antibiotics. Landis Gandy, RN

## 2019-11-29 ENCOUNTER — Ambulatory Visit: Payer: Medicare PPO | Admitting: Cardiology

## 2019-12-06 DIAGNOSIS — E1065 Type 1 diabetes mellitus with hyperglycemia: Secondary | ICD-10-CM | POA: Diagnosis not present

## 2019-12-09 DIAGNOSIS — G4733 Obstructive sleep apnea (adult) (pediatric): Secondary | ICD-10-CM | POA: Diagnosis not present

## 2019-12-13 ENCOUNTER — Ambulatory Visit: Payer: Medicare PPO | Admitting: Cardiology

## 2019-12-13 ENCOUNTER — Other Ambulatory Visit: Payer: Self-pay

## 2019-12-13 ENCOUNTER — Encounter: Payer: Self-pay | Admitting: Cardiology

## 2019-12-13 VITALS — BP 106/74 | HR 68 | Ht 66.0 in | Wt 258.8 lb

## 2019-12-13 DIAGNOSIS — I480 Paroxysmal atrial fibrillation: Secondary | ICD-10-CM | POA: Diagnosis not present

## 2019-12-13 NOTE — Patient Instructions (Signed)
Medication Instructions:  Your physician recommends that you continue on your current medications as directed. Please refer to the Current Medication list given to you today.  *If you need a refill on your cardiac medications before your next appointment, please call your pharmacy*   Lab Work: None ordered If you have labs (blood work) drawn today and your tests are completely normal, you will receive your results only by: . MyChart Message (if you have MyChart) OR . A paper copy in the mail If you have any lab test that is abnormal or we need to change your treatment, we will call you to review the results.   Testing/Procedures: None ordered   Follow-Up: At [facility], you and your health needs are our priority.  As part of our continuing mission to provide you with exceptional heart care, we have created designated Provider Care Teams.  These Care Teams include your primary Cardiologist (physician) and Advanced Practice Providers (APPs -  Physician Assistants and Nurse Practitioners) who all work together to provide you with the care you need, when you need it.  We recommend signing up for the patient portal called "MyChart".  Sign up information is provided on this After Visit Summary.  MyChart is used to connect with patients for Virtual Visits (Telemedicine).  Patients are able to view lab/test results, encounter notes, upcoming appointments, etc.  Non-urgent messages can be sent to your provider as well.   To learn more about what you can do with MyChart, go to [email or web address].    Your next appointment:   6 month(s)  The format for your next appointment:   In Person  Provider:   Will Camnitz, MD   Thank you for choosing [facility]!!   Alnita Aybar, RN [phone]    Other Instructions    
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
## 2019-12-22 DIAGNOSIS — Z01818 Encounter for other preprocedural examination: Secondary | ICD-10-CM | POA: Diagnosis not present

## 2019-12-22 DIAGNOSIS — E669 Obesity, unspecified: Secondary | ICD-10-CM | POA: Diagnosis not present

## 2019-12-22 DIAGNOSIS — G4733 Obstructive sleep apnea (adult) (pediatric): Secondary | ICD-10-CM | POA: Diagnosis not present

## 2019-12-22 DIAGNOSIS — Z01810 Encounter for preprocedural cardiovascular examination: Secondary | ICD-10-CM | POA: Diagnosis not present

## 2019-12-22 DIAGNOSIS — E119 Type 2 diabetes mellitus without complications: Secondary | ICD-10-CM | POA: Diagnosis not present

## 2019-12-22 DIAGNOSIS — E785 Hyperlipidemia, unspecified: Secondary | ICD-10-CM | POA: Diagnosis not present

## 2019-12-22 DIAGNOSIS — I4891 Unspecified atrial fibrillation: Secondary | ICD-10-CM | POA: Diagnosis not present

## 2019-12-22 DIAGNOSIS — Z01812 Encounter for preprocedural laboratory examination: Secondary | ICD-10-CM | POA: Diagnosis not present

## 2019-12-22 DIAGNOSIS — Z96659 Presence of unspecified artificial knee joint: Secondary | ICD-10-CM | POA: Diagnosis not present

## 2019-12-22 DIAGNOSIS — T364X5A Adverse effect of tetracyclines, initial encounter: Secondary | ICD-10-CM | POA: Diagnosis not present

## 2019-12-22 DIAGNOSIS — I2510 Atherosclerotic heart disease of native coronary artery without angina pectoris: Secondary | ICD-10-CM | POA: Diagnosis not present

## 2019-12-22 DIAGNOSIS — T8459XA Infection and inflammatory reaction due to other internal joint prosthesis, initial encounter: Secondary | ICD-10-CM | POA: Diagnosis not present

## 2019-12-23 ENCOUNTER — Telehealth: Payer: Self-pay | Admitting: *Deleted

## 2019-12-23 NOTE — Telephone Encounter (Signed)
° °  [facility] Pre-operative Risk Assessment    HEARTCARE STAFF: - Please ensure there is not already an duplicate clearance open for this procedure. - Under Visit Info/Reason for Call, type in Other and utilize the format Clearance MM/DD/YY or Clearance TBD. Do not use dashes or single digits. - If request is for dental extraction, please clarify the # of teeth to be extracted.  Request for surgical clearance:  1. What type of surgery is being performed? REVISION OF RIGHT TOTAL KNEE ARTHROPLASTY   2. When is this surgery scheduled? 01/05/20   3. What type of clearance is required (medical clearance vs. Pharmacy clearance to hold med vs. Both)? BOTH  4. Are there any medications that need to be held prior to surgery and how long? ELIQUIS x 3 DAYS PRIOR TO SURGERY   5. Practice name and name of physician performing surgery? [facility]; DR. Denyse Amass OTERO   6. What is the office phone number? [phone]   7.   What is the office fax number? [phone] OR [phone]  8.   Anesthesia type (None, local, MAC, general) ? CHOICE   Julaine Hua 12/23/2019, 12:52 PM  _________________________________________________________________   (provider comments below)

## 2019-12-23 NOTE — Telephone Encounter (Signed)
Patient with diagnosis of afib on Eliquis for anticoagulation.    Procedure: REVISION OF RIGHT TOTAL KNEE ARTHROPLASTY  Date of procedure: 01/05/20    CHA2DS2-VASc Score = 4  This indicates a 4.8% annual risk of stroke. The patient's score is based upon: CHF History: 0 HTN History: 1 Diabetes History: 1 Stroke History: 0 Vascular Disease History: 1 Age Score: 1 Gender Score: 0      CrCl 97 ml/min  Per office protocol, patient can hold Eliquis for 3 days prior to procedure.

## 2019-12-27 NOTE — Telephone Encounter (Signed)
   Primary Cardiologist: [name], MD  Chart reviewed as part of pre-operative protocol coverage. The patient was recently seen 12/13/19 by Dr. Curt Bears and denied any cardiac symptoms. Given past medical history and time since last visit, based on ACC/AHA guidelines, PELLEGRINO KENNARD would be at acceptable risk for the planned procedure without further cardiovascular testing.   Per pharmacy recommendation okay to hold Eliquis 3 days prior to the procedure.   The patient was advised that if he develops new symptoms prior to surgery to contact our office to arrange for a follow-up visit, and he verbalized understanding.  I will route this recommendation to the requesting party via Epic fax function and remove from pre-op pool.  Please call with questions.  Caid Radin Ninfa Meeker, PA-C 12/27/2019, 10:01 AM

## 2020-01-05 DIAGNOSIS — M2351 Chronic instability of knee, right knee: Secondary | ICD-10-CM | POA: Diagnosis not present

## 2020-01-05 DIAGNOSIS — T849XXA Unspecified complication of internal orthopedic prosthetic device, implant and graft, initial encounter: Secondary | ICD-10-CM | POA: Diagnosis not present

## 2020-01-05 DIAGNOSIS — Z96651 Presence of right artificial knee joint: Secondary | ICD-10-CM | POA: Diagnosis not present

## 2020-01-05 DIAGNOSIS — Z7901 Long term (current) use of anticoagulants: Secondary | ICD-10-CM | POA: Diagnosis not present

## 2020-01-05 DIAGNOSIS — I480 Paroxysmal atrial fibrillation: Secondary | ICD-10-CM | POA: Diagnosis not present

## 2020-01-05 DIAGNOSIS — T84092A Other mechanical complication of internal right knee prosthesis, initial encounter: Secondary | ICD-10-CM | POA: Diagnosis not present

## 2020-01-05 DIAGNOSIS — K2100 Gastro-esophageal reflux disease with esophagitis, without bleeding: Secondary | ICD-10-CM | POA: Diagnosis not present

## 2020-01-05 DIAGNOSIS — T84062A Wear of articular bearing surface of internal prosthetic right knee joint, initial encounter: Secondary | ICD-10-CM | POA: Diagnosis not present

## 2020-01-05 DIAGNOSIS — E119 Type 2 diabetes mellitus without complications: Secondary | ICD-10-CM | POA: Diagnosis not present

## 2020-01-05 DIAGNOSIS — G4733 Obstructive sleep apnea (adult) (pediatric): Secondary | ICD-10-CM | POA: Diagnosis not present

## 2020-01-05 DIAGNOSIS — I4891 Unspecified atrial fibrillation: Secondary | ICD-10-CM | POA: Diagnosis not present

## 2020-01-05 DIAGNOSIS — T84022A Instability of internal right knee prosthesis, initial encounter: Secondary | ICD-10-CM | POA: Diagnosis not present

## 2020-01-05 DIAGNOSIS — Z6839 Body mass index (BMI) 39.0-39.9, adult: Secondary | ICD-10-CM | POA: Diagnosis not present

## 2020-01-05 DIAGNOSIS — E785 Hyperlipidemia, unspecified: Secondary | ICD-10-CM | POA: Diagnosis not present

## 2020-01-05 DIAGNOSIS — E782 Mixed hyperlipidemia: Secondary | ICD-10-CM | POA: Diagnosis not present

## 2020-01-05 DIAGNOSIS — K219 Gastro-esophageal reflux disease without esophagitis: Secondary | ICD-10-CM | POA: Diagnosis not present
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
## 2020-01-24 ENCOUNTER — Encounter: Payer: Self-pay | Admitting: Cardiovascular Disease

## 2020-01-24 ENCOUNTER — Ambulatory Visit: Payer: Medicare PPO | Admitting: Cardiovascular Disease

## 2020-01-24 ENCOUNTER — Other Ambulatory Visit: Payer: Self-pay

## 2020-01-24 VITALS — BP 128/62 | HR 80 | Ht 67.0 in | Wt 260.0 lb

## 2020-01-24 DIAGNOSIS — I480 Paroxysmal atrial fibrillation: Secondary | ICD-10-CM

## 2020-01-24 NOTE — Patient Instructions (Addendum)

## 2020-01-25 DIAGNOSIS — Z4789 Encounter for other orthopedic aftercare: Secondary | ICD-10-CM | POA: Diagnosis not present

## 2020-02-21 ENCOUNTER — Other Ambulatory Visit: Payer: Self-pay

## 2020-02-21 ENCOUNTER — Encounter: Payer: Self-pay | Admitting: Infectious Disease

## 2020-02-21 ENCOUNTER — Ambulatory Visit: Payer: Medicare PPO | Admitting: Infectious Disease

## 2020-02-21 VITALS — BP 113/81 | HR 77 | Temp 97.6°F | Resp 16 | Ht 67.0 in | Wt 261.5 lb

## 2020-02-21 DIAGNOSIS — I4891 Unspecified atrial fibrillation: Secondary | ICD-10-CM | POA: Diagnosis not present

## 2020-02-21 DIAGNOSIS — T364X5A Adverse effect of tetracyclines, initial encounter: Secondary | ICD-10-CM

## 2020-02-21 DIAGNOSIS — T8453XD Infection and inflammatory reaction due to internal right knee prosthesis, subsequent encounter: Secondary | ICD-10-CM

## 2020-02-21 DIAGNOSIS — E785 Hyperlipidemia, unspecified: Secondary | ICD-10-CM

## 2020-02-21 DIAGNOSIS — L271 Localized skin eruption due to drugs and medicaments taken internally: Secondary | ICD-10-CM

## 2020-02-21 DIAGNOSIS — M00061 Staphylococcal arthritis, right knee: Secondary | ICD-10-CM | POA: Diagnosis not present

## 2020-02-21 DIAGNOSIS — A498 Other bacterial infections of unspecified site: Secondary | ICD-10-CM

## 2020-02-21 DIAGNOSIS — Z1629 Resistance to other single specified antibiotic: Secondary | ICD-10-CM | POA: Diagnosis not present

## 2020-02-21 DIAGNOSIS — B957 Other staphylococcus as the cause of diseases classified elsewhere: Secondary | ICD-10-CM

## 2020-02-21 DIAGNOSIS — E1169 Type 2 diabetes mellitus with other specified complication: Secondary | ICD-10-CM

## 2020-02-21 DIAGNOSIS — A4902 Methicillin resistant Staphylococcus aureus infection, unspecified site: Secondary | ICD-10-CM | POA: Insufficient documentation

## 2020-02-21 DIAGNOSIS — I483 Typical atrial flutter: Secondary | ICD-10-CM

## 2020-02-21 HISTORY — DX: Other bacterial infections of unspecified site: A49.8

## 2020-02-21 HISTORY — DX: Other staphylococcus as the cause of diseases classified elsewhere: B95.7

## 2020-02-21 HISTORY — DX: Methicillin resistant Staphylococcus aureus infection, unspecified site: A49.02

## 2020-02-21 MED ORDER — SULFAMETHOXAZOLE-TRIMETHOPRIM 800-160 MG PO TABS: 1.0000 | ORAL_TABLET | Freq: Two times a day (BID) | ORAL | 3 refills | Status: AC

## 2020-02-21 MED ORDER — CEPHALEXIN 500 MG PO CAPS
1000.0000 mg | ORAL_CAPSULE | Freq: Two times a day (BID) | ORAL | 3 refills | Status: DC
Start: 2020-02-21 — End: 2020-05-12
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
## 2020-02-22 LAB — BASIC METABOLIC PANEL WITH GFR
BUN: 13 mg/dL (ref 7–25)
CO2: 28 mmol/L (ref 20–32)
Calcium: 9.7 mg/dL (ref 8.6–10.3)
Chloride: 105 mmol/L (ref 98–110)
Creat: 0.79 mg/dL (ref 0.70–1.18)
GFR, Est African American: 103 mL/min/{1.73_m2} (ref >=60)
GFR, Est Non African American: 89 mL/min/{1.73_m2} (ref >=60)
Glucose, Bld: 117 mg/dL — ABNORMAL HIGH (ref 65–99)
Potassium: 5.3 mmol/L (ref 3.5–5.3)
Sodium: 142 mmol/L (ref 135–146)

## 2020-02-22 LAB — CBC WITH DIFFERENTIAL/PLATELET
Absolute Monocytes: 760 cells/uL (ref 200–950)
Basophils Absolute: 57 cells/uL (ref 0–200)
Basophils Relative: 0.8 %
Eosinophils Absolute: 178 cells/uL (ref 15–500)
Eosinophils Relative: 2.5 %
HCT: 41.7 % (ref 38.5–50.0)
Hemoglobin: 13.4 g/dL (ref 13.2–17.1)
Lymphs Abs: 1889 cells/uL (ref 850–3900)
MCH: 28 pg (ref 27.0–33.0)
MCHC: 32.1 g/dL (ref 32.0–36.0)
MCV: 87.1 fL (ref 80.0–100.0)
MPV: 11.2 fL (ref 7.5–12.5)
Monocytes Relative: 10.7 %
Neutro Abs: 4217 cells/uL (ref 1500–7800)
Neutrophils Relative %: 59.4 %
Platelets: 388 10*3/uL (ref 140–400)
RBC: 4.79 10*6/uL (ref 4.20–5.80)
RDW: 14.3 % (ref 11.0–15.0)
Total Lymphocyte: 26.6 %
WBC: 7.1 10*3/uL (ref 3.8–10.8)

## 2020-02-22 LAB — C-REACTIVE PROTEIN: CRP: 25.5 mg/L — ABNORMAL HIGH (ref <8.0)

## 2020-02-22 LAB — SEDIMENTATION RATE: Sed Rate: 41 mm/h — ABNORMAL HIGH (ref 0–20)

## 2020-02-24 DIAGNOSIS — E1065 Type 1 diabetes mellitus with hyperglycemia: Secondary | ICD-10-CM | POA: Diagnosis not present

## 2020-03-20 ENCOUNTER — Other Ambulatory Visit: Payer: Medicare PPO

## 2020-03-21 ENCOUNTER — Other Ambulatory Visit: Payer: Medicare PPO

## 2020-03-21 DIAGNOSIS — G4733 Obstructive sleep apnea (adult) (pediatric): Secondary | ICD-10-CM | POA: Diagnosis not present

## 2020-03-22 ENCOUNTER — Other Ambulatory Visit: Payer: Self-pay

## 2020-03-22 ENCOUNTER — Other Ambulatory Visit (INDEPENDENT_AMBULATORY_CARE_PROVIDER_SITE_OTHER): Payer: Medicare PPO

## 2020-03-22 ENCOUNTER — Other Ambulatory Visit: Payer: Medicare PPO

## 2020-03-22 DIAGNOSIS — E559 Vitamin D deficiency, unspecified: Secondary | ICD-10-CM

## 2020-03-22 DIAGNOSIS — E538 Deficiency of other specified B group vitamins: Secondary | ICD-10-CM | POA: Diagnosis not present

## 2020-03-22 DIAGNOSIS — E1169 Type 2 diabetes mellitus with other specified complication: Secondary | ICD-10-CM

## 2020-03-22 DIAGNOSIS — Z0000 Encounter for general adult medical examination without abnormal findings: Secondary | ICD-10-CM | POA: Diagnosis not present

## 2020-03-22 DIAGNOSIS — E785 Hyperlipidemia, unspecified: Secondary | ICD-10-CM

## 2020-03-22 LAB — HEPATIC FUNCTION PANEL
ALT: 16 U/L (ref 0–53)
AST: 16 U/L (ref 0–37)
Albumin: 4.2 g/dL (ref 3.5–5.2)
Alkaline Phosphatase: 73 U/L (ref 39–117)
Bilirubin, Direct: 0.1 mg/dL (ref 0.0–0.3)
Total Bilirubin: 0.3 mg/dL (ref 0.2–1.2)
Total Protein: 6.6 g/dL (ref 6.0–8.3)

## 2020-03-22 LAB — URINALYSIS, ROUTINE W REFLEX MICROSCOPIC
Bilirubin Urine: NEGATIVE
Hgb urine dipstick: NEGATIVE
Ketones, ur: NEGATIVE
Leukocytes,Ua: NEGATIVE
Nitrite: NEGATIVE
RBC / HPF: NONE SEEN (ref 0–?)
Specific Gravity, Urine: 1.02 (ref 1.000–1.030)
Urine Glucose: NEGATIVE
Urobilinogen, UA: 0.2 (ref 0.0–1.0)
pH: 7 (ref 5.0–8.0)

## 2020-03-22 LAB — LIPID PANEL
Cholesterol: 114 mg/dL (ref 0–200)
HDL: 33.7 mg/dL — ABNORMAL LOW (ref >39.00)
LDL Cholesterol: 46 mg/dL (ref 0–99)
NonHDL: 80.29
Total CHOL/HDL Ratio: 3
Triglycerides: 171 mg/dL — ABNORMAL HIGH (ref 0.0–149.0)
VLDL: 34.2 mg/dL (ref 0.0–40.0)

## 2020-03-22 LAB — TSH: TSH: 2.88 u[IU]/mL (ref 0.35–4.50)

## 2020-03-22 LAB — CBC WITH DIFFERENTIAL/PLATELET
Basophils Absolute: 0 10*3/uL (ref 0.0–0.1)
Basophils Relative: 0.5 % (ref 0.0–3.0)
Eosinophils Absolute: 0.2 10*3/uL (ref 0.0–0.7)
Eosinophils Relative: 3.2 % (ref 0.0–5.0)
HCT: 42.7 % (ref 39.0–52.0)
Hemoglobin: 13.8 g/dL (ref 13.0–17.0)
Lymphocytes Relative: 25.6 % (ref 12.0–46.0)
Lymphs Abs: 1.9 10*3/uL (ref 0.7–4.0)
MCHC: 32.3 g/dL (ref 30.0–36.0)
MCV: 84.6 fl (ref 78.0–100.0)
Monocytes Absolute: 0.8 10*3/uL (ref 0.1–1.0)
Monocytes Relative: 10.9 % (ref 3.0–12.0)
Neutro Abs: 4.5 10*3/uL (ref 1.4–7.7)
Neutrophils Relative %: 59.8 % (ref 43.0–77.0)
Platelets: 343 10*3/uL (ref 150.0–400.0)
RBC: 5.04 Mil/uL (ref 4.22–5.81)
RDW: 15.5 % (ref 11.5–15.5)
WBC: 7.6 10*3/uL (ref 4.0–10.5)

## 2020-03-22 LAB — MICROALBUMIN / CREATININE URINE RATIO
Creatinine,U: 133.4 mg/dL
Microalb Creat Ratio: 5.8 mg/g (ref 0.0–30.0)
Microalb, Ur: 7.7 mg/dL — ABNORMAL HIGH (ref 0.0–1.9)

## 2020-03-22 LAB — PSA: PSA: 3.65 ng/mL (ref 0.10–4.00)

## 2020-03-22 LAB — VITAMIN B12: Vitamin B-12: >1526 pg/mL — ABNORMAL HIGH (ref 211–911)

## 2020-03-22 LAB — BASIC METABOLIC PANEL
BUN: 19 mg/dL (ref 6–23)
CO2: 27 mEq/L (ref 19–32)
Calcium: 9.5 mg/dL (ref 8.4–10.5)
Chloride: 104 mEq/L (ref 96–112)
Creatinine, Ser: 0.92 mg/dL (ref 0.40–1.50)
GFR: 82.68 mL/min (ref >60.00)
Glucose, Bld: 107 mg/dL — ABNORMAL HIGH (ref 70–99)
Potassium: 5 mEq/L (ref 3.5–5.1)
Sodium: 140 mEq/L (ref 135–145)

## 2020-03-22 LAB — HEMOGLOBIN A1C: Hgb A1c MFr Bld: 6.1 % (ref 4.6–6.5)

## 2020-03-22 LAB — VITAMIN D 25 HYDROXY (VIT D DEFICIENCY, FRACTURES): VITD: 29.06 ng/mL — ABNORMAL LOW (ref 30.00–100.00)

## 2020-03-22 NOTE — Addendum Note (Signed)
Addended by: Raliegh Ip on: 03/22/2020 10:59 AM   Modules accepted: Orders

## 2020-03-23 ENCOUNTER — Other Ambulatory Visit: Payer: Self-pay

## 2020-03-24 ENCOUNTER — Encounter: Payer: Self-pay | Admitting: Internal Medicine

## 2020-03-24 ENCOUNTER — Ambulatory Visit: Payer: Medicare PPO | Admitting: Internal Medicine

## 2020-03-24 VITALS — BP 120/74 | HR 75 | Temp 96.5°F | Ht 67.0 in | Wt 259.0 lb

## 2020-03-24 DIAGNOSIS — E1169 Type 2 diabetes mellitus with other specified complication: Secondary | ICD-10-CM | POA: Diagnosis not present

## 2020-03-24 DIAGNOSIS — E559 Vitamin D deficiency, unspecified: Secondary | ICD-10-CM | POA: Insufficient documentation

## 2020-03-24 DIAGNOSIS — Z0000 Encounter for general adult medical examination without abnormal findings: Secondary | ICD-10-CM | POA: Diagnosis not present

## 2020-03-24 DIAGNOSIS — Z0001 Encounter for general adult medical examination with abnormal findings: Secondary | ICD-10-CM

## 2020-03-24 DIAGNOSIS — E785 Hyperlipidemia, unspecified: Secondary | ICD-10-CM

## 2020-03-24 MED ORDER — TRAMADOL HCL 50 MG PO TABS: 50.0000 mg | ORAL_TABLET | Freq: Four times a day (QID) | ORAL | 2 refills | Status: DC | PRN
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
## 2020-03-24 NOTE — Patient Instructions (Addendum)
Please continue all other medications as before, and refills have been done - the tramadol  Please have the pharmacy call with any other refills you may need.  Please continue your efforts at being more active, low cholesterol diet, and weight control.  You are otherwise up to date with prevention measures today.  Please keep your appointments with your specialists as you may have planned  Please make an Appointment to return in 6 months, or sooner if needed, also with Lab Appointment for testing done 3-5 days before at the [facility] (so this is for TWO appointments - please see the scheduling desk as you leave)  Due to the ongoing Covid 19 pandemic, our lab now requires an appointment for any labs done at our office.  If you need labs done and do not have an appointment, please call our office ahead of time to schedule before presenting to the lab for your testing

## 2020-03-26 ENCOUNTER — Encounter: Payer: Self-pay | Admitting: Internal Medicine

## 2020-03-26 NOTE — Assessment & Plan Note (Signed)
Lab Results  Component Value Date   LDLCALC 46 03/22/2020   Stable, pt to continue current statin crestor  Current Outpatient Medications (Endocrine & Metabolic):  .  metFORMIN (GLUCOPHAGE) 500 MG tablet, Take 1 tablet (500 mg total) by mouth 2 (two) times daily as needed (sugar > 180).  Current Outpatient Medications (Cardiovascular):  .  diltiazem (CARDIZEM CD) 360 MG 24 hr capsule, Take 1 capsule (360 mg total) by mouth daily. .  metoprolol tartrate (LOPRESSOR) 25 MG tablet, TAKE 1 TABLET TWICE DAILY (DOSE DECREASE) .  rosuvastatin (CRESTOR) 10 MG tablet, Take 1 tablet (10 mg total) by mouth daily.  Current Outpatient Medications (Respiratory):  .  cetirizine (ZYRTEC) 10 MG tablet, Take 10 mg by mouth daily.  .  fluticasone (FLONASE) 50 MCG/ACT nasal spray, Place 1 spray into both nostrils daily.  Current Outpatient Medications (Analgesics):  .  acetaminophen (TYLENOL) 500 MG tablet, Take 1,000 mg by mouth every 6 (six) hours as needed for mild pain or headache.  .  traMADol (ULTRAM) 50 MG tablet, Take 1 tablet (50 mg total) by mouth every 6 (six) hours as needed (moderate pain or spasms). Muscle spasms  Current Outpatient Medications (Hematological):  Marland Kitchen  Cyanocobalamin (VITAMIN B-12 PO), Take 1 tablet by mouth at bedtime.  Marland Kitchen  ELIQUIS 5 MG TABS tablet, TAKE 1 TABLET TWICE DAILY  Current Outpatient Medications (Other):  .  cephALEXin (KEFLEX) 500 MG capsule, Take 2 capsules (1,000 mg total) by mouth 2 (two) times daily. .  Continuous Blood Gluc Receiver (FREESTYLE LIBRE READER) DEVI, 1 Stick by Does not apply route 2 (two) times daily. .  Continuous Blood Gluc Sensor (FREESTYLE LIBRE 14 DAY SENSOR) MISC, Use as directed to check blood sugars twice a day .  diclofenac sodium (VOLTAREN) 1 % GEL, Apply 2 g topically 4 (four) times daily as needed. .  Lactobacillus (ACIDOPHILUS) TABS, Take 1 tablet by mouth at bedtime.  .  Multiple Vitamins-Minerals (CENTRUM SILVER 50+MEN) TABS, Take 1  tablet by mouth daily. Marland Kitchen  nystatin cream (MYCOSTATIN), Apply 1 application topically 2 (two) times daily as needed (as directed- to affected areas of the face).  Marland Kitchen  sulfamethoxazole-trimethoprim (BACTRIM DS) 800-160 MG tablet, Take 1 tablet by mouth 2 (two) times daily. Marland Kitchen  tiZANidine (ZANAFLEX) 2 MG tablet, Take 1 tablet (2 mg total) by mouth every 6 (six) hours as needed.

## 2020-03-26 NOTE — Assessment & Plan Note (Signed)
Last vitamin D Lab Results  Component Value Date   VD25OH 29.06 (L) 03/22/2020   Mild low, cont oral replacement d

## 2020-03-26 NOTE — Assessment & Plan Note (Signed)
Lab Results  Component Value Date   HGBA1C 6.1 03/22/2020   Stable, pt to continue current medical treatment metformin

## 2020-03-26 NOTE — Assessment & Plan Note (Signed)

## 2020-03-30 ENCOUNTER — Telehealth: Payer: Self-pay | Admitting: Cardiology

## 2020-03-30 MED ORDER — APIXABAN 5 MG PO TABS: 5.0000 mg | ORAL_TABLET | Freq: Two times a day (BID) | ORAL | 1 refills | Status: DC

## 2020-03-30 NOTE — Telephone Encounter (Signed)
Prescription refill request for Eliquis received.  Indication: Afib Last office visit: 01/24/2020, Nishan Scr: 0.92, 03/22/2020 Age: 75 yo  Weight: 117.5 kg   Prescription refill sent.

## 2020-03-30 NOTE — Telephone Encounter (Signed)
*  STAT* If patient is at the pharmacy, call can be transferred to refill team.   1. Which medications need to be refilled? (please list name of each medication and dose if known) ELIQUIS 5 MG TABS tablet  2. Which pharmacy/location (including street and city if local pharmacy) is medication to be sent to? [facility] - [place], [place] - [address]  3. Do they need a 30 day or 90 day supply? 90 day supply   

## 2020-04-18 ENCOUNTER — Other Ambulatory Visit: Payer: Self-pay

## 2020-04-18 ENCOUNTER — Telehealth: Payer: Self-pay | Admitting: Internal Medicine

## 2020-04-18 MED ORDER — METOPROLOL TARTRATE 25 MG PO TABS: ORAL_TABLET | ORAL | 2 refills | Status: DC

## 2020-04-18 MED ORDER — ROSUVASTATIN CALCIUM 10 MG PO TABS: 10.0000 mg | ORAL_TABLET | Freq: Every day | ORAL | 3 refills | Status: DC

## 2020-04-18 NOTE — Telephone Encounter (Signed)
1.Medication Requested: rosuvastatin (CRESTOR) 10 MG tablet    2. Pharmacy (Name, [place], [facility]): [facility] - [place], [place]  3. On Med List: yes   4. Last Visit with PCP: 1.21.22  5. Next visit date with PCP: 7.21.22   Agent: Please be advised that RX refills may take up to 3 business days. We ask that you follow-up with your pharmacy.

## 2020-04-18 NOTE — Telephone Encounter (Signed)
Pt's medication was sent to pt's pharmacy as requested. Confirmation received.  °

## 2020-04-25 DIAGNOSIS — D485 Neoplasm of uncertain behavior of skin: Secondary | ICD-10-CM | POA: Diagnosis not present

## 2020-04-25 DIAGNOSIS — L570 Actinic keratosis: Secondary | ICD-10-CM | POA: Diagnosis not present

## 2020-05-05 ENCOUNTER — Telehealth: Payer: Self-pay

## 2020-05-05 DIAGNOSIS — Z86010 Personal history of colonic polyps: Secondary | ICD-10-CM | POA: Diagnosis not present

## 2020-05-05 DIAGNOSIS — K2270 Barrett's esophagus without dysplasia: Secondary | ICD-10-CM | POA: Diagnosis not present

## 2020-05-05 NOTE — Telephone Encounter (Signed)
   [facility] Pre-operative Risk Assessment    HEARTCARE STAFF: - Please ensure there is not already an duplicate clearance open for this procedure. - Under Visit Info/Reason for Call, type in Other and utilize the format Clearance MM/DD/YY or Clearance TBD. Do not use dashes or single digits. - If request is for dental extraction, please clarify the # of teeth to be extracted.  Request for surgical clearance:  1. What type of surgery is being performed? COLONOSCOPY/ENDOSCOPY   2. When is this surgery scheduled? 06/15/20   3. What type of clearance is required (medical clearance vs. Pharmacy clearance to hold med vs. Both)? PHARMACY  4. Are there any medications that need to be held prior to surgery and how long? APIXABAN HOLD 2 PRIOR TO PROCEDURE   5. Practice name and name of physician performing surgery? [facility], DR. Cristina Gong   6. What is the office phone number? [phone]   7.   What is the office fax number? [phone]  8.   Anesthesia type (None, local, MAC, general) ? PROPOFOL   Jacinta Shoe 05/05/2020, 4:16 PM  _________________________________________________________________   (provider comments below)

## 2020-05-05 NOTE — Telephone Encounter (Signed)
Patient with diagnosis of A Fib on Eliquis for anticoagulation.    1. Procedure: COLONOSCOPY/ENDOSCOPY   Date of procedure: 06/15/20   CHA2DS2-VASc Score = 4  This indicates a 4.8% annual risk of stroke. The patient's score is based upon: CHF History: No HTN History: Yes Diabetes History: Yes Stroke History: No Vascular Disease History: Yes Age Score: 1 Gender Score: 0   CrCl 180mL/min Platelet count  343K   Per office protocol, patient can hold Eliquis for 2 days prior to procedure.

## 2020-05-08 NOTE — Telephone Encounter (Signed)
   Primary Cardiologist: [name], MD  Chart reviewed as part of pre-operative protocol coverage. Given past medical history, Eric Santos would be at acceptable risk for the planned procedure without further cardiovascular testing.   Patient with diagnosis of A Fib on Eliquis for anticoagulation.    1. Procedure: COLONOSCOPY/ENDOSCOPY  Date of procedure: 06/15/20   CHA2DS2-VASc Score = 4  This indicates a 4.8% annual risk of stroke. The patient's score is based upon: CHF History: No HTN History: Yes Diabetes History: Yes Stroke History: No Vascular Disease History: Yes Age Score: 1 Gender Score: 0   CrCl 140mL/min Platelet count  343K   Per office protocol, patient can hold Eliquis for 2 days prior to procedure.   I will route this recommendation to the requesting party via Epic fax function and remove from pre-op pool.  Please call with questions.  Jossie Ng. Cleaver NP-C    05/08/2020, 8:46 AM [place] [address] Office [phone] Fax [phone]

## 2020-05-09 DIAGNOSIS — C44329 Squamous cell carcinoma of skin of other parts of face: Secondary | ICD-10-CM | POA: Diagnosis not present

## 2020-05-10 ENCOUNTER — Other Ambulatory Visit: Payer: Self-pay | Admitting: Cardiovascular Disease

## 2020-05-11 ENCOUNTER — Other Ambulatory Visit: Payer: Self-pay | Admitting: Infectious Disease

## 2020-05-11 MED ORDER — DILTIAZEM HCL ER COATED BEADS 360 MG PO CP24: 360.0000 mg | ORAL_CAPSULE | Freq: Every day | ORAL | 2 refills | Status: DC

## 2020-05-15 DIAGNOSIS — E1065 Type 1 diabetes mellitus with hyperglycemia: Secondary | ICD-10-CM | POA: Diagnosis not present

## 2020-05-24 ENCOUNTER — Encounter: Payer: Self-pay | Admitting: Internal Medicine

## 2020-05-24 ENCOUNTER — Other Ambulatory Visit: Payer: Self-pay

## 2020-05-24 ENCOUNTER — Ambulatory Visit: Payer: Medicare PPO | Admitting: Internal Medicine

## 2020-05-24 VITALS — BP 117/75 | HR 62 | Wt 262.0 lb

## 2020-05-24 DIAGNOSIS — T8453XD Infection and inflammatory reaction due to internal right knee prosthesis, subsequent encounter: Secondary | ICD-10-CM | POA: Diagnosis not present

## 2020-05-24 DIAGNOSIS — A4902 Methicillin resistant Staphylococcus aureus infection, unspecified site: Secondary | ICD-10-CM

## 2020-05-24 DIAGNOSIS — Z5181 Encounter for therapeutic drug level monitoring: Secondary | ICD-10-CM | POA: Diagnosis not present

## 2020-05-24 DIAGNOSIS — B9561 Methicillin susceptible Staphylococcus aureus infection as the cause of diseases classified elsewhere: Secondary | ICD-10-CM

## 2020-05-24 NOTE — Assessment & Plan Note (Signed)
Will check a bmp today on the bactrim to be sure no concerns.

## 2020-05-24 NOTE — Progress Notes (Signed)
   Subjective:    Patient ID: Eric Santos, male    DOB: Jun 18, 1945, 75 y.o.   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 Mayer Camel in November 2020 due to infection and culture with CoNS, oxacillin sensitive, clinda, tetracycline and TMP/SMX resistant.  He was on daptomycin for nearly 6 weeks but due to side effects was stopped a bit early.  He was then placed on Keflex after that, initially 4 times a day then reduced to 1 gram twice a day.  He then later last year developed more leg swelling and noted instability of his prosthetic joint and sent to Dr. Elisabeth Most in [place] who performed a revision of the total knee on 01/05/20.  Since that time he has done relatively well, though he was hoping for a more rapid recovery.  He is able to get around and get on his tractor and work.  No new issues have developed since his surgery.      In addition to above, he has a history of an MRSA infection in 2008 with this joint and underwent two stage revision at that time and had remained on minocycline chronically since that time with concern for chronic infection but developed a likely minocycline-induced sun rash last year.  He was then changed to Bactrim and has been on that since that time and tolerating well.  Some mild increase in K.     Review of Systems  Constitutional: Negative for fatigue and unexpected weight change.  Gastrointestinal: Negative for diarrhea and nausea.  Skin: Negative for rash.       Objective:   Physical Exam Eyes:     General: No scleral icterus. Musculoskeletal:        General: No swelling. Normal range of motion.  Neurological:     Mental Status: He is alert.  Psychiatric:        Mood and Affect: Mood normal.   SH: no tobacco       Assessment & Plan:

## 2020-05-24 NOTE — Assessment & Plan Note (Signed)
He continues on Bactrim for his chronic suppression and as above, will continue for now.

## 2020-05-24 NOTE — Assessment & Plan Note (Signed)
At this time, he is doing well with no concerns.  He is tolerating the Bactrim and keflex and will continue.  I am going to check his inflammatory markers today to be sure no new concerns and monitor the trend.  The issue going forward will be if he needs to stay on his antibiotics long term or if we will consider stopping them at some point.  Bactrim in particular can be problematic long term with renal and K issues so is not optimal. Keflex may be more tolerable and can consider daily suppression with either or both later.  I would like though to at least continue with both at the current dose for the rest of this calendar year and reassess.  It may be reasonable to use them once daily as long as he tolerates and stop as needed with side effects.  Will continue this discussion with him going forward and he will return in 3 months.

## 2020-05-25 LAB — BASIC METABOLIC PANEL
BUN: 16 mg/dL (ref 7–25)
CO2: 28 mmol/L (ref 20–32)
Calcium: 9.6 mg/dL (ref 8.6–10.3)
Chloride: 103 mmol/L (ref 98–110)
Creat: 0.82 mg/dL (ref 0.70–1.18)
Glucose, Bld: 107 mg/dL — ABNORMAL HIGH (ref 65–99)
Potassium: 5 mmol/L (ref 3.5–5.3)
Sodium: 138 mmol/L (ref 135–146)

## 2020-05-25 LAB — CBC WITH DIFFERENTIAL/PLATELET
Absolute Monocytes: 984 cells/uL — ABNORMAL HIGH (ref 200–950)
Basophils Absolute: 40 cells/uL (ref 0–200)
Basophils Relative: 0.5 %
Eosinophils Absolute: 160 cells/uL (ref 15–500)
Eosinophils Relative: 2 %
HCT: 40.3 % (ref 38.5–50.0)
Hemoglobin: 13.1 g/dL — ABNORMAL LOW (ref 13.2–17.1)
Lymphs Abs: 1832 cells/uL (ref 850–3900)
MCH: 26.9 pg — ABNORMAL LOW (ref 27.0–33.0)
MCHC: 32.5 g/dL (ref 32.0–36.0)
MCV: 82.8 fL (ref 80.0–100.0)
MPV: 10.6 fL (ref 7.5–12.5)
Monocytes Relative: 12.3 %
Neutro Abs: 4984 cells/uL (ref 1500–7800)
Neutrophils Relative %: 62.3 %
Platelets: 358 10*3/uL (ref 140–400)
RBC: 4.87 10*6/uL (ref 4.20–5.80)
RDW: 15.1 % — ABNORMAL HIGH (ref 11.0–15.0)
Total Lymphocyte: 22.9 %
WBC: 8 10*3/uL (ref 3.8–10.8)

## 2020-05-25 LAB — C-REACTIVE PROTEIN: CRP: 38.4 mg/L — ABNORMAL HIGH (ref <8.0)

## 2020-05-25 LAB — SEDIMENTATION RATE: Sed Rate: 34 mm/h — ABNORMAL HIGH (ref 0–20)

## 2020-06-03 ENCOUNTER — Other Ambulatory Visit: Payer: Self-pay | Admitting: Infectious Disease

## 2020-06-15 DIAGNOSIS — D123 Benign neoplasm of transverse colon: Secondary | ICD-10-CM | POA: Diagnosis not present

## 2020-06-15 DIAGNOSIS — D120 Benign neoplasm of cecum: Secondary | ICD-10-CM | POA: Diagnosis not present

## 2020-06-15 DIAGNOSIS — K2270 Barrett's esophagus without dysplasia: Secondary | ICD-10-CM | POA: Diagnosis not present

## 2020-06-15 DIAGNOSIS — K5730 Diverticulosis of large intestine without perforation or abscess without bleeding: Secondary | ICD-10-CM | POA: Diagnosis not present

## 2020-06-15 DIAGNOSIS — Z86010 Personal history of colonic polyps: Secondary | ICD-10-CM | POA: Diagnosis not present

## 2020-06-22 DIAGNOSIS — G4733 Obstructive sleep apnea (adult) (pediatric): Secondary | ICD-10-CM | POA: Diagnosis not present

## 2020-06-23 DIAGNOSIS — K2270 Barrett's esophagus without dysplasia: Secondary | ICD-10-CM | POA: Diagnosis not present

## 2020-06-23 DIAGNOSIS — D123 Benign neoplasm of transverse colon: Secondary | ICD-10-CM | POA: Diagnosis not present

## 2020-06-23 DIAGNOSIS — D120 Benign neoplasm of cecum: Secondary | ICD-10-CM | POA: Diagnosis not present

## 2020-06-28 IMAGING — DX DG CHEST 2V
2 series · 2 of 2 positions shown · non-contrast
Comparison: 12/27/2017

CLINICAL DATA: Heart palpitations since yesterday. No shortness of
breath or chest pain.

EXAM:
CHEST - 2 VIEW

[chest pa]
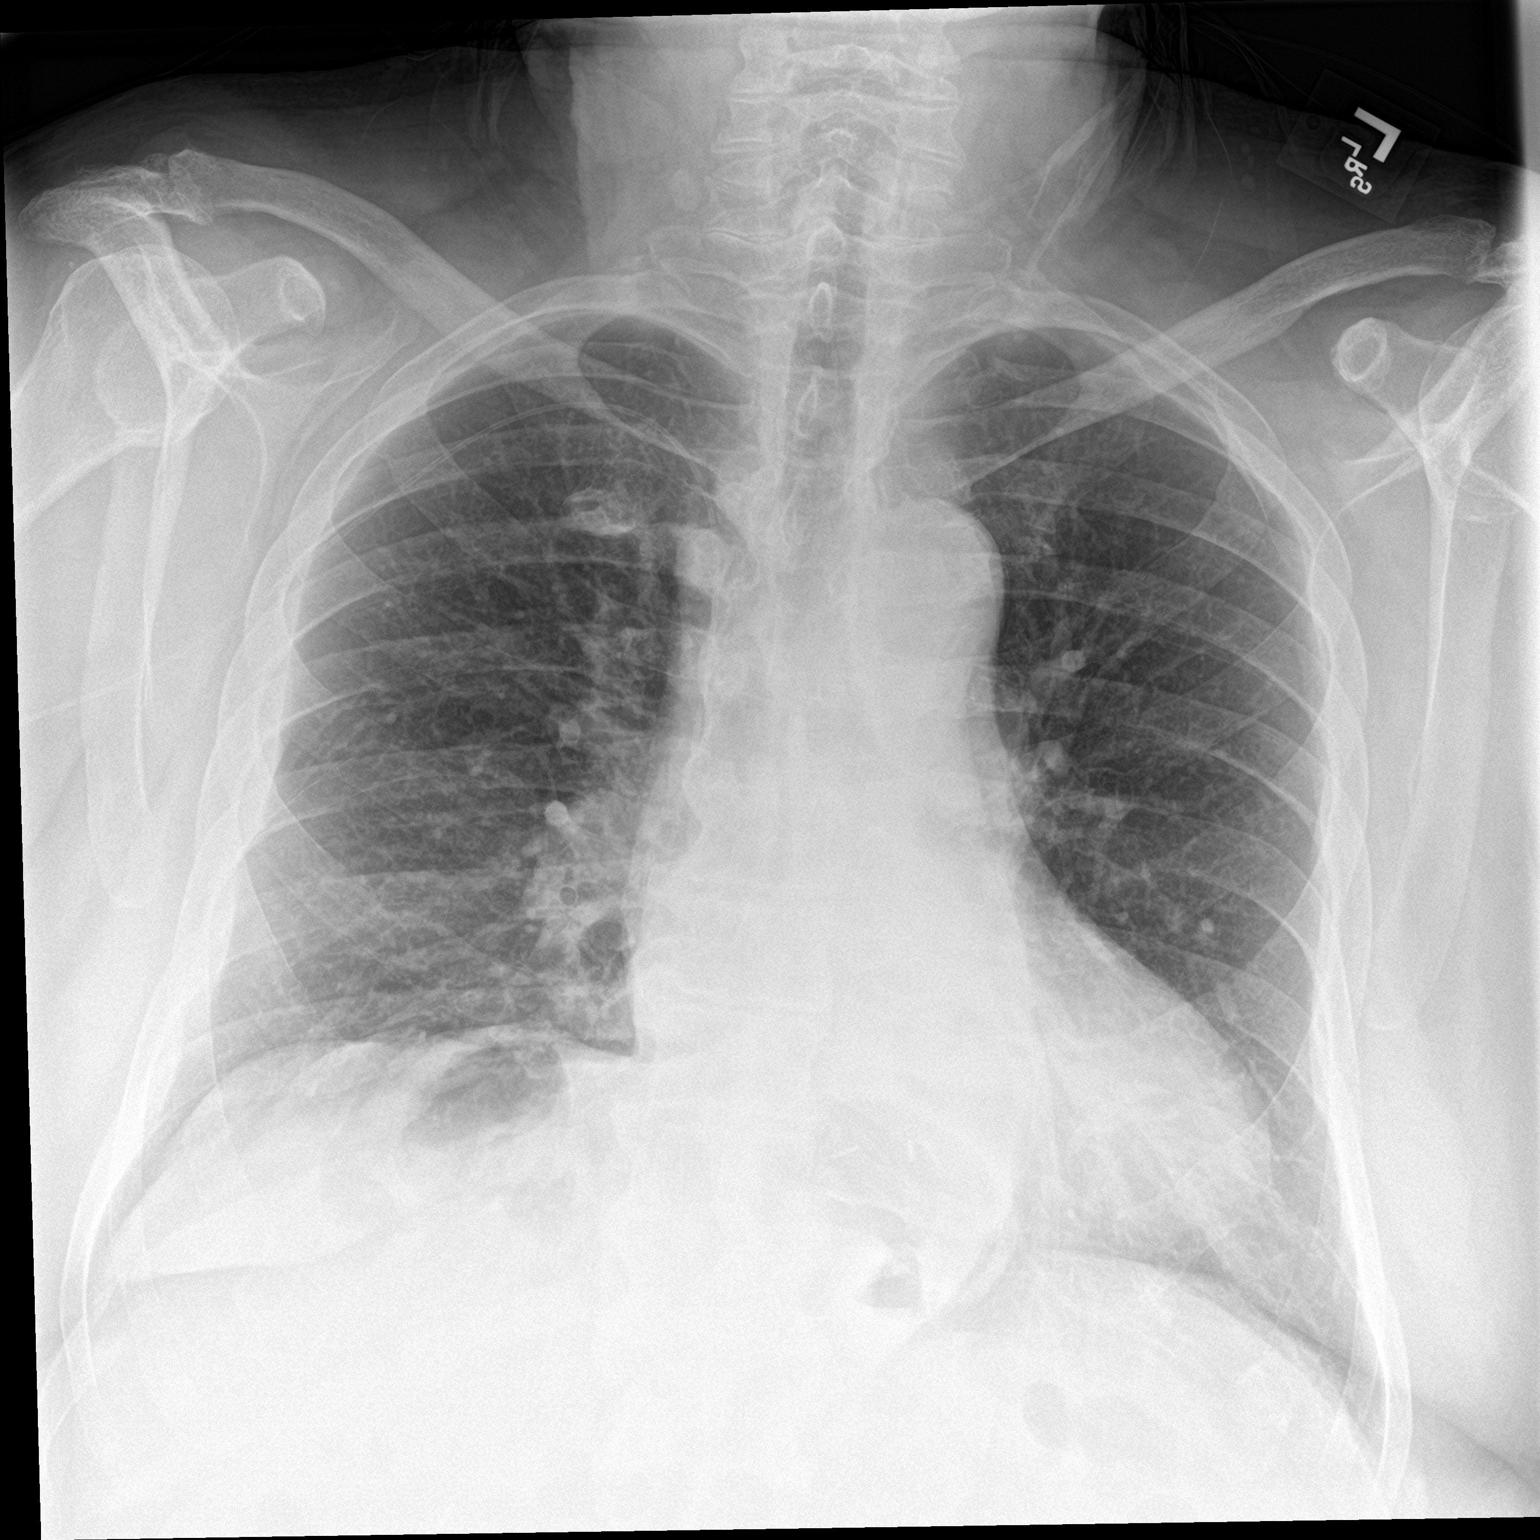

[chest lat]
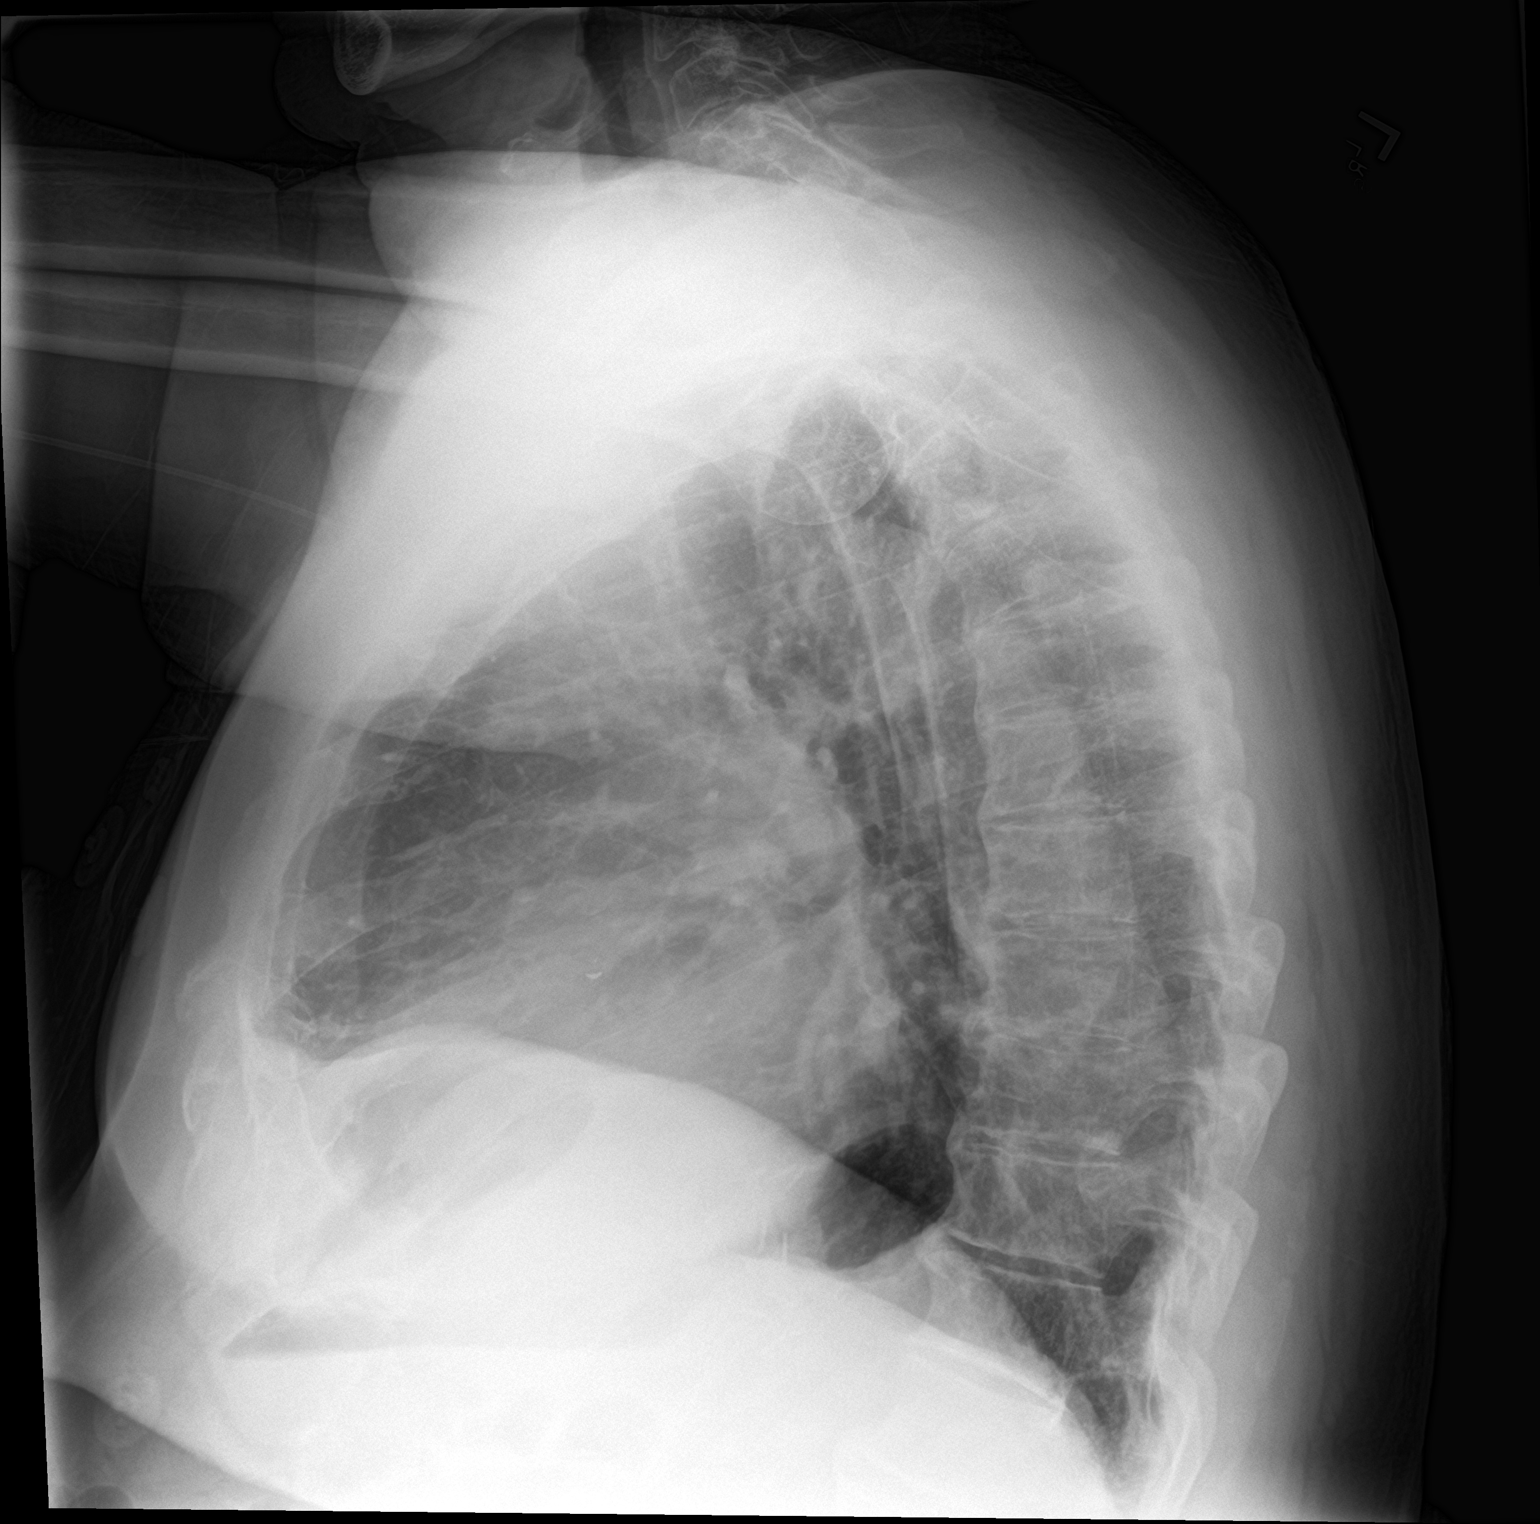

[2 of 2 positions shown; findings below may reference images not displayed]

FINDINGS: Cardiac silhouette is top-normal in size. Small hiatal hernia. No
mediastinal or hilar masses or evidence of adenopathy.

Mild elevation of the right hemidiaphragm, stable.

Clear lungs.  No pleural effusion or pneumothorax.

Right PICC has its catheter tip in the lower superior vena cava.

Skeletal structures are intact.
IMPRESSION: 1. No acute cardiopulmonary disease.

## 2020-07-20 ENCOUNTER — Other Ambulatory Visit: Payer: Self-pay | Admitting: Internal Medicine

## 2020-08-01 IMAGING — DX DG CHEST 1V PORT
1 series · 1 of 1 positions shown · non-contrast
Comparison: 01/24/2019

CLINICAL DATA: Chest tightness and shortness of breath.

EXAM:
PORTABLE CHEST 1 VIEW

[chest]
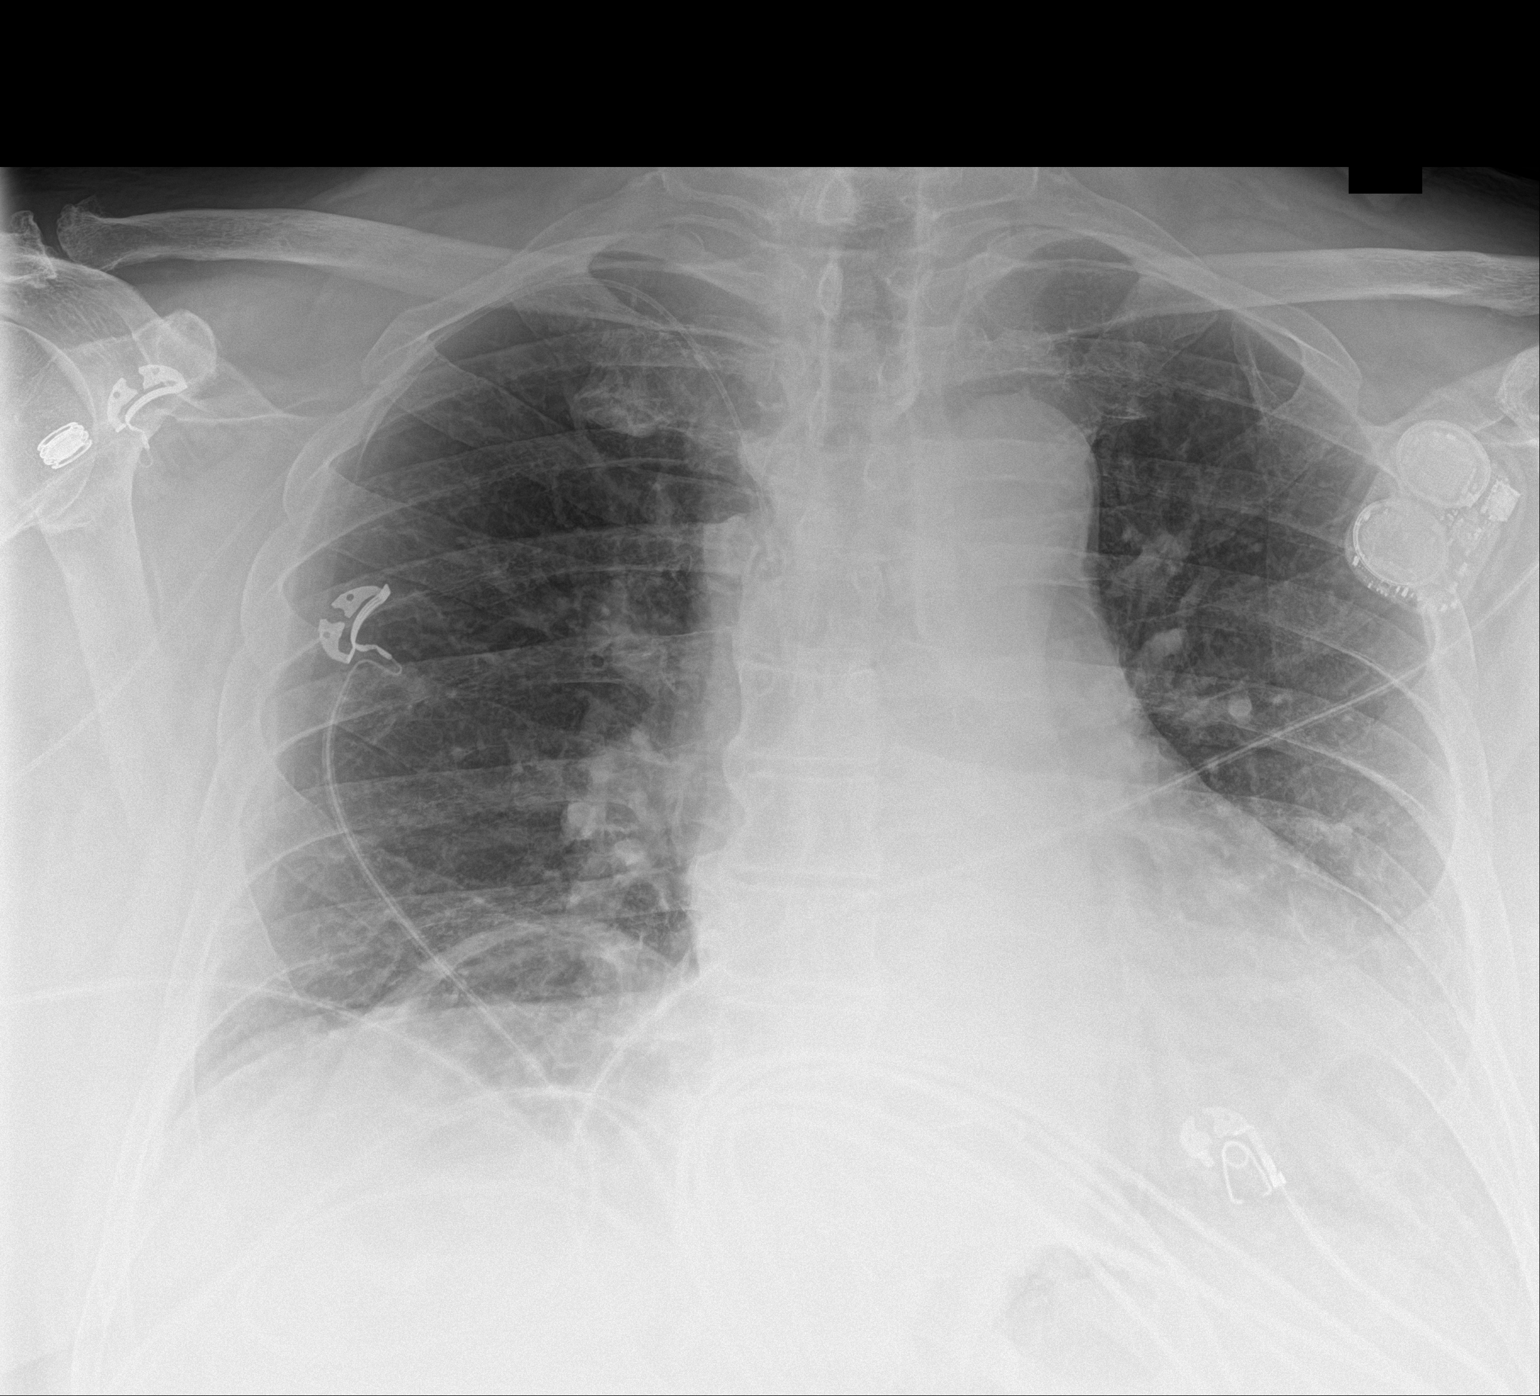

[1 of 1 positions shown; findings below may reference images not displayed]

FINDINGS: 2199 hours. The cardio pericardial silhouette is enlarged. Right
PICC line tip projects over the mid SVC level. There is pulmonary
vascular congestion without overt pulmonary edema. No focal airspace
consolidation or pleural effusion. The visualized bony structures of
the thorax are intact. Telemetry leads overlie the chest.
IMPRESSION: Cardiomegaly with vascular congestion. No acute cardiopulmonary
findings.

## 2020-08-03 DIAGNOSIS — E1065 Type 1 diabetes mellitus with hyperglycemia: Secondary | ICD-10-CM | POA: Diagnosis not present

## 2020-08-08 DIAGNOSIS — Z20822 Contact with and (suspected) exposure to covid-19: Secondary | ICD-10-CM | POA: Diagnosis not present

## 2020-08-23 ENCOUNTER — Other Ambulatory Visit: Payer: Self-pay | Admitting: Cardiology

## 2020-08-23 NOTE — Telephone Encounter (Signed)
Eliquis 5mg  refill request received. Patient is 75 years old, weight-118.8kg, Crea-0.82 on 05/24/2020, Diagnosis-Afib, and last seen by Dr. Johnsie Cancel on 01/24/20. Dose is appropriate based on dosing criteria. Will send in refill to requested pharmacy.

## 2020-08-28 ENCOUNTER — Ambulatory Visit (INDEPENDENT_AMBULATORY_CARE_PROVIDER_SITE_OTHER): Payer: Medicare PPO | Admitting: Cardiology

## 2020-08-28 ENCOUNTER — Encounter: Payer: Self-pay | Admitting: Cardiology

## 2020-08-28 ENCOUNTER — Other Ambulatory Visit: Payer: Self-pay

## 2020-08-28 VITALS — BP 110/68 | HR 62 | Ht 66.0 in | Wt 260.0 lb

## 2020-08-28 DIAGNOSIS — I480 Paroxysmal atrial fibrillation: Secondary | ICD-10-CM
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
## 2020-08-28 NOTE — Patient Instructions (Signed)
Medication Instructions:  °Your physician recommends that you continue on your current medications as directed. Please refer to the Current Medication list given to you today. ° °*If you need a refill on your cardiac medications before your next appointment, please call your pharmacy* ° ° °Lab Work: °None ordered ° ° °Testing/Procedures: °None ordered ° ° °Follow-Up: °At [facility], you and your health needs are our priority.  As part of our continuing mission to provide you with exceptional heart care, we have created designated Provider Care Teams.  These Care Teams include your primary Cardiologist (physician) and Advanced Practice Providers (APPs -  Physician Assistants and Nurse Practitioners) who all work together to provide you with the care you need, when you need it. ° °Your next appointment:   °6 month(s) ° °The format for your next appointment:   °In Person ° °Provider:   °Will Camnitz, MD ° ° ° °Thank you for choosing [facility]!! ° ° °Zeke Aker, RN °[phone] °  °

## 2020-09-11 ENCOUNTER — Encounter: Payer: Self-pay | Admitting: Internal Medicine

## 2020-09-11 ENCOUNTER — Ambulatory Visit: Payer: Medicare PPO | Admitting: Internal Medicine

## 2020-09-11 ENCOUNTER — Other Ambulatory Visit: Payer: Self-pay

## 2020-09-11 VITALS — BP 122/77 | HR 65 | Temp 97.4°F | Wt 260.0 lb

## 2020-09-11 DIAGNOSIS — T8453XD Infection and inflammatory reaction due to internal right knee prosthesis, subsequent encounter: Secondary | ICD-10-CM | POA: Diagnosis not present

## 2020-09-11 DIAGNOSIS — Z5181 Encounter for therapeutic drug level monitoring: Secondary | ICD-10-CM | POA: Diagnosis not present

## 2020-09-11 NOTE — Assessment & Plan Note (Signed)
Will check his bmp today to be sure no issues, particularly with the Bactrim

## 2020-09-11 NOTE — Progress Notes (Signed)
   Subjective:    Patient ID: Eric Santos, male    DOB: 08/14/1945, 75 y.o.   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 Mayer Camel in November 2020 due to infection and culture with CoNS, oxacillin sensitive, clinda, tetracycline and TMP/SMX resistant.  He was on daptomycin for nearly 6 weeks but due to side effects was stopped a bit early.  He was then placed on Keflex after that, initially 4 times a day then reduced to 1 gram twice a day.  He then later last year developed more leg swelling and noted instability of his prosthetic joint and sent to Dr. Elisabeth Most in [place] who performed a revision of the total knee on 01/05/20.      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  He was then changed to Bactrim and has been on that since that time and tolerating well.    He comes in today still on Bactim twice a day and 1 gram Keflex twice a day.  He is having no side effects including no rash, no diarrhea.  His knee continues to be stable and he is able to keep active with some expected limitations.     Review of Systems  Constitutional:  Negative for fatigue.  Gastrointestinal:  Negative for diarrhea and nausea.  Skin:  Negative for rash.      Objective:   Physical Exam Eyes:     General: No scleral icterus. Pulmonary:     Effort: Pulmonary effort is normal.  Musculoskeletal:        General: Normal range of motion.     Comments: Right knee with some effusion with no warmth, no erythema  Neurological:     Mental Status: He is alert.  Psychiatric:        Mood and Affect: Mood normal.  SH: no  tobacco       Assessment & Plan:

## 2020-09-11 NOTE — Assessment & Plan Note (Addendum)
He is tolerating the two drug suppressive regimen well and will continue as long as he tolerates.  I will consider though in 2023 to reduce him to daily Bactrim and daily 1 gram keflex.  Otherwise, will follow up in 3 months unless concerns on today's labs Will check CRP, ESR

## 2020-09-12 LAB — C-REACTIVE PROTEIN: CRP: 17.1 mg/L — ABNORMAL HIGH (ref <8.0)

## 2020-09-12 LAB — BASIC METABOLIC PANEL
BUN: 13 mg/dL (ref 7–25)
CO2: 30 mmol/L (ref 20–32)
Calcium: 9.8 mg/dL (ref 8.6–10.3)
Chloride: 104 mmol/L (ref 98–110)
Creat: 0.85 mg/dL (ref 0.70–1.28)
Glucose, Bld: 98 mg/dL (ref 65–99)
Potassium: 5.4 mmol/L — ABNORMAL HIGH (ref 3.5–5.3)
Sodium: 140 mmol/L (ref 135–146)

## 2020-09-12 LAB — SEDIMENTATION RATE: Sed Rate: 36 mm/h — ABNORMAL HIGH (ref 0–20)

## 2020-09-18 ENCOUNTER — Other Ambulatory Visit (INDEPENDENT_AMBULATORY_CARE_PROVIDER_SITE_OTHER): Payer: Medicare PPO

## 2020-09-18 ENCOUNTER — Other Ambulatory Visit: Payer: Self-pay

## 2020-09-18 DIAGNOSIS — E559 Vitamin D deficiency, unspecified: Secondary | ICD-10-CM | POA: Diagnosis not present

## 2020-09-18 DIAGNOSIS — E785 Hyperlipidemia, unspecified: Secondary | ICD-10-CM | POA: Diagnosis not present

## 2020-09-18 DIAGNOSIS — E1169 Type 2 diabetes mellitus with other specified complication: Secondary | ICD-10-CM

## 2020-09-18 LAB — BASIC METABOLIC PANEL
BUN: 13 mg/dL (ref 6–23)
CO2: 26 mEq/L (ref 19–32)
Calcium: 9 mg/dL (ref 8.4–10.5)
Chloride: 103 mEq/L (ref 96–112)
Creatinine, Ser: 0.83 mg/dL (ref 0.40–1.50)
GFR: 86.69 mL/min (ref >60.00)
Glucose, Bld: 100 mg/dL — ABNORMAL HIGH (ref 70–99)
Potassium: 4.5 mEq/L (ref 3.5–5.1)
Sodium: 138 mEq/L (ref 135–145)

## 2020-09-18 LAB — LIPID PANEL
Cholesterol: 94 mg/dL (ref 0–200)
HDL: 34.4 mg/dL — ABNORMAL LOW (ref >39.00)
LDL Cholesterol: 38 mg/dL (ref 0–99)
NonHDL: 59.78
Total CHOL/HDL Ratio: 3
Triglycerides: 109 mg/dL (ref 0.0–149.0)
VLDL: 21.8 mg/dL (ref 0.0–40.0)

## 2020-09-18 LAB — HEMOGLOBIN A1C: Hgb A1c MFr Bld: 6.4 % (ref 4.6–6.5)

## 2020-09-18 LAB — VITAMIN D 25 HYDROXY (VIT D DEFICIENCY, FRACTURES): VITD: 30.79 ng/mL (ref 30.00–100.00)

## 2020-09-18 LAB — HEPATIC FUNCTION PANEL
ALT: 14 U/L (ref 0–53)
AST: 16 U/L (ref 0–37)
Albumin: 4.1 g/dL (ref 3.5–5.2)
Alkaline Phosphatase: 69 U/L (ref 39–117)
Bilirubin, Direct: 0.1 mg/dL (ref 0.0–0.3)
Total Bilirubin: 0.3 mg/dL (ref 0.2–1.2)
Total Protein: 6.9 g/dL (ref 6.0–8.3)

## 2020-09-21 ENCOUNTER — Other Ambulatory Visit: Payer: Self-pay

## 2020-09-21 ENCOUNTER — Ambulatory Visit: Payer: Medicare PPO | Admitting: Internal Medicine

## 2020-09-21 ENCOUNTER — Encounter: Payer: Self-pay | Admitting: Internal Medicine

## 2020-09-21 VITALS — BP 126/70 | HR 66 | Temp 97.7°F | Resp 18 | Ht 66.0 in | Wt 259.8 lb

## 2020-09-21 DIAGNOSIS — Z96651 Presence of right artificial knee joint: Secondary | ICD-10-CM | POA: Diagnosis not present

## 2020-09-21 DIAGNOSIS — E538 Deficiency of other specified B group vitamins: Secondary | ICD-10-CM | POA: Diagnosis not present

## 2020-09-21 DIAGNOSIS — E1169 Type 2 diabetes mellitus with other specified complication: Secondary | ICD-10-CM | POA: Diagnosis not present

## 2020-09-21 DIAGNOSIS — F5101 Primary insomnia: Secondary | ICD-10-CM

## 2020-09-21 DIAGNOSIS — E7800 Pure hypercholesterolemia, unspecified: Secondary | ICD-10-CM

## 2020-09-21 DIAGNOSIS — M25561 Pain in right knee: Secondary | ICD-10-CM

## 2020-09-21 DIAGNOSIS — E785 Hyperlipidemia, unspecified: Secondary | ICD-10-CM | POA: Diagnosis not present

## 2020-09-21 DIAGNOSIS — E559 Vitamin D deficiency, unspecified: Secondary | ICD-10-CM | POA: Diagnosis not present

## 2020-09-21 DIAGNOSIS — G4700 Insomnia, unspecified: Secondary | ICD-10-CM | POA: Insufficient documentation

## 2020-09-21 DIAGNOSIS — G8929 Other chronic pain: Secondary | ICD-10-CM

## 2020-09-21 DIAGNOSIS — Z0000 Encounter for general adult medical examination without abnormal findings: Secondary | ICD-10-CM

## 2020-09-21 MED ORDER — CHOLECALCIFEROL 50 MCG (2000 UT) PO TABS: ORAL_TABLET | ORAL | 99 refills | Status: DC

## 2020-09-21 MED ORDER — TRAZODONE HCL 50 MG PO TABS: 25.0000 mg | ORAL_TABLET | Freq: Every evening | ORAL | 1 refills | Status: DC | PRN

## 2020-09-21 MED ORDER — TRAMADOL HCL 50 MG PO TABS: 50.0000 mg | ORAL_TABLET | Freq: Four times a day (QID) | ORAL | 2 refills | Status: DC | PRN

## 2020-09-21 NOTE — Patient Instructions (Signed)
Please take OTC Vitamin D3 at 2000 units per day, indefinitely  Ok to continue the b12 three times per week  Please take all new medication as prescribed - the trazodone for sleep if OTC melatonin does not seem to work out  Please continue all other medications as before, and refills have been done if requested  - tramadol  Please have the pharmacy call with any other refills you may need.  Please continue your efforts at being more active, low cholesterol diet, and weight control.  Please keep your appointments with your specialists as you may have planned  Please make an Appointment to return in 6 months, or sooner if needed, also with Lab Appointment for testing done 3-5 days before at the [facility] (so this is for TWO appointments - please see the scheduling desk as you leave)  Due to the ongoing Covid 19 pandemic, our lab now requires an appointment for any labs done at our office.  If you need labs done and do not have an appointment, please call our office ahead of time to schedule before presenting to the lab for your testing.
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
## 2020-09-24 ENCOUNTER — Encounter: Payer: Self-pay | Admitting: Internal Medicine

## 2020-09-24 NOTE — Assessment & Plan Note (Signed)
With recent worsening symptoms, for melatonin up to 10 mg or temazepam qhs prn,  to f/u any worsening symptoms or concerns

## 2020-09-24 NOTE — Assessment & Plan Note (Signed)
Last vitamin D Lab Results  Component Value Date   VD25OH 30.79 09/18/2020   Low, to start oral replacement 2000 u qd

## 2020-09-24 NOTE — Assessment & Plan Note (Signed)
Lab Results  Component Value Date   LDLCALC 38 09/18/2020   Stable, pt to continue current statin crestor 10

## 2020-09-24 NOTE — Assessment & Plan Note (Signed)
Unfortunate failed right knee s/p TKR, for tramadol prn,  to f/u any worsening symptoms or concerns

## 2020-09-24 NOTE — Assessment & Plan Note (Signed)
Lab Results  Component Value Date   HGBA1C 6.4 09/18/2020   Stable, pt to continue current medical treatment  - metformin

## 2020-10-02 DIAGNOSIS — E119 Type 2 diabetes mellitus without complications: Secondary | ICD-10-CM | POA: Diagnosis not present

## 2020-10-02 LAB — HM DIABETES EYE EXAM

## 2020-10-04 ENCOUNTER — Other Ambulatory Visit: Payer: Self-pay | Admitting: Internal Medicine

## 2020-10-04 ENCOUNTER — Other Ambulatory Visit: Payer: Self-pay

## 2020-10-04 DIAGNOSIS — T8453XD Infection and inflammatory reaction due to internal right knee prosthesis, subsequent encounter: Secondary | ICD-10-CM

## 2020-10-04 MED ORDER — SULFAMETHOXAZOLE-TRIMETHOPRIM 800-160 MG PO TABS
1.0000 | ORAL_TABLET | Freq: Two times a day (BID) | ORAL | 5 refills | Status: AC
Start: 2020-10-04 — End: 2020-11-03

## 2020-10-05 ENCOUNTER — Encounter: Payer: Self-pay | Admitting: Internal Medicine

## 2020-10-17 DIAGNOSIS — G4733 Obstructive sleep apnea (adult) (pediatric): Secondary | ICD-10-CM | POA: Diagnosis not present

## 2020-10-23 DIAGNOSIS — E1065 Type 1 diabetes mellitus with hyperglycemia: Secondary | ICD-10-CM | POA: Diagnosis not present

## 2020-10-31 DIAGNOSIS — L821 Other seborrheic keratosis: Secondary | ICD-10-CM | POA: Diagnosis not present

## 2020-10-31 DIAGNOSIS — L918 Other hypertrophic disorders of the skin: Secondary | ICD-10-CM | POA: Diagnosis not present

## 2020-10-31 DIAGNOSIS — L820 Inflamed seborrheic keratosis: Secondary | ICD-10-CM | POA: Diagnosis not present

## 2020-11-25 ENCOUNTER — Other Ambulatory Visit: Payer: Self-pay | Admitting: Cardiology

## 2020-11-27 ENCOUNTER — Other Ambulatory Visit: Payer: Self-pay

## 2020-12-11 ENCOUNTER — Telehealth: Payer: Self-pay | Admitting: Internal Medicine

## 2020-12-11 ENCOUNTER — Other Ambulatory Visit: Payer: Self-pay

## 2020-12-11 ENCOUNTER — Ambulatory Visit (INDEPENDENT_AMBULATORY_CARE_PROVIDER_SITE_OTHER): Payer: Medicare PPO

## 2020-12-11 ENCOUNTER — Ambulatory Visit: Payer: Medicare PPO | Admitting: Internal Medicine

## 2020-12-11 ENCOUNTER — Encounter: Payer: Self-pay | Admitting: Internal Medicine

## 2020-12-11 VITALS — HR 66 | Temp 97.5°F | Ht 66.0 in | Wt 266.0 lb

## 2020-12-11 DIAGNOSIS — Z23 Encounter for immunization: Secondary | ICD-10-CM

## 2020-12-11 DIAGNOSIS — T8453XD Infection and inflammatory reaction due to internal right knee prosthesis, subsequent encounter: Secondary | ICD-10-CM

## 2020-12-11 DIAGNOSIS — Z5181 Encounter for therapeutic drug level monitoring: Secondary | ICD-10-CM

## 2020-12-11 NOTE — Progress Notes (Signed)
   Covid-19 Vaccination Clinic  Name:  Eric Santos    MRN: [id number] DOB: Jul 13, 1945  12/11/2020  Mr. Eric Santos was observed post Covid-19 immunization for 15 minutes without incident. He was provided with Vaccine Information Sheet and instruction to access the V-Safe system.   Mr. Eric Santos was instructed to call 911 with any severe reactions post vaccine: Difficulty breathing  Swelling of face and throat  A fast heartbeat  A bad rash all over body  Dizziness and weakness     Beryle Flock, RN

## 2020-12-11 NOTE — Telephone Encounter (Signed)
Left message for patient to call me back at [phone] to schedule Medicare Annual Wellness Visit   Last AWV  09/16/19  Please schedule at anytime with [facility] if patient calls the office back.    40 Minutes appointment   Any questions, please call me at [phone]

## 2020-12-12 ENCOUNTER — Encounter: Payer: Self-pay | Admitting: Internal Medicine

## 2020-12-12 LAB — BASIC METABOLIC PANEL
BUN: 15 mg/dL (ref 7–25)
CO2: 30 mmol/L (ref 20–32)
Calcium: 9.9 mg/dL (ref 8.6–10.3)
Chloride: 103 mmol/L (ref 98–110)
Creat: 0.87 mg/dL (ref 0.70–1.28)
Glucose, Bld: 74 mg/dL (ref 65–99)
Potassium: 4.6 mmol/L (ref 3.5–5.3)
Sodium: 140 mmol/L (ref 135–146)

## 2020-12-12 MED ORDER — SULFAMETHOXAZOLE-TRIMETHOPRIM 800-160 MG PO TABS
1.0000 | ORAL_TABLET | Freq: Two times a day (BID) | ORAL | 3 refills | Status: DC
Start: 2020-12-12 — End: 2021-06-12

## 2020-12-12 MED ORDER — CEPHALEXIN 500 MG PO CAPS: 1000.0000 mg | ORAL_CAPSULE | Freq: Two times a day (BID) | ORAL | 3 refills | Status: DC

## 2020-12-12 NOTE — Assessment & Plan Note (Addendum)
He bmp was checked at the visit and is wnl.  Bactrim refilled.  He has follow up labs scheduled with Dr. Jenny Reichmann in January included creat so he will follow up with me in 6 months.

## 2020-12-12 NOTE — Progress Notes (Signed)
   Subjective:    Patient ID: Eric Santos, male    DOB: Sep 11, 1945, 75 y.o.   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 Mayer Camel in November 2020 due to infection and culture with CoNS, oxacillin sensitive, clinda, tetracycline and TMP/SMX resistant.  He was on daptomycin for nearly 6 weeks but due to side effects was stopped a bit early.  He was then placed on Keflex after that, initially 4 times a day then reduced to 1 gram twice a day.  He then later last year developed more leg swelling and noted instability of his prosthetic joint and sent to Dr. Elisabeth Most in [place] who performed a revision of the total knee on 01/05/20.      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  He was then changed to Bactrim and has been on that since that time and tolerating well.    He is here today for routine follow up and continues taking bactrim and keflex with no issues with either.  No associated rash or diarrhea. No complaints with his knee or anything else.  Able to keep active, just not like he had been used to.     Review of Systems  Constitutional:  Negative for chills, fatigue and fever.  Gastrointestinal:  Negative for diarrhea.  Skin:  Negative for rash.      Objective:   Physical Exam Eyes:     General: No scleral icterus. Pulmonary:     Effort: Pulmonary effort is normal.  Musculoskeletal:        General: Normal range of motion.     Comments: Right knee with stable effusion, no warmth, no tenderness  Neurological:     Mental Status: He is alert.  Psychiatric:        Mood and Affect: Mood normal.   SH: no tobacco       Assessment & Plan:

## 2020-12-12 NOTE — Assessment & Plan Note (Signed)
Stable on current antibiotics.  I again discussed that as long as he is tolerating, will continue, though can consider dose or frequency reduction later in 2023 with one or the other, particularly Bactrim which can effect kidney function.

## 2020-12-21 ENCOUNTER — Other Ambulatory Visit: Payer: Self-pay

## 2020-12-21 ENCOUNTER — Ambulatory Visit (INDEPENDENT_AMBULATORY_CARE_PROVIDER_SITE_OTHER): Payer: Medicare PPO

## 2020-12-21 VITALS — BP 118/78 | HR 59 | Temp 97.8°F | Ht 66.0 in | Wt 266.2 lb

## 2020-12-21 DIAGNOSIS — Z0000 Encounter for general adult medical examination without abnormal findings: Secondary | ICD-10-CM

## 2020-12-21 NOTE — Patient Instructions (Signed)
Mr. Eric Santos , Thank you for taking time to come for your Medicare Wellness Visit. I appreciate your ongoing commitment to your health goals. Please review the following plan we discussed and let me know if I can assist you in the future.   Screening recommendations/referrals: Colonoscopy: 06/15/2020; not recommended unless medical issues Recommended yearly ophthalmology/optometry visit for glaucoma screening and checkup Recommended yearly dental visit for hygiene and checkup  Vaccinations: Influenza vaccine: 12/11/2020 Pneumococcal vaccine: 10/26/2012, 01/20/2014 Tdap vaccine: 01/22/2018; due very 10 years Shingles vaccine: never done; can check with local pharmacy   Covid-19: 04/15/2019, 05/13/2019, 01/17/2020, 12/11/2020  Advanced directives: Please bring a copy of your health care power of attorney and living will to the office at your convenience.  Conditions/risks identified: Yes; Client understands the importance of follow-up with providers by attending scheduled visits and discussed goals to eat healthier, increase physical activity, exercise the brain, socialize more, get enough sleep and make time for laughter.  Next appointment: Please schedule your next Medicare Wellness Visit with your Nurse Health Advisor in 1 year by calling [phone].  Preventive Care 74 Years and Older, Male Preventive care refers to lifestyle choices and visits with your health care provider that can promote health and wellness. What does preventive care include? A yearly physical exam. This is also called an annual well check. Dental exams once or twice a year. Routine eye exams. Ask your health care provider how often you should have your eyes checked. Personal lifestyle choices, including: Daily care of your teeth and gums. Regular physical activity. Eating a healthy diet. Avoiding tobacco and drug use. Limiting alcohol use. Practicing safe sex. Taking low doses of aspirin every day. Taking vitamin  and mineral supplements as recommended by your health care provider. What happens during an annual well check? The services and screenings done by your health care provider during your annual well check will depend on your age, overall health, lifestyle risk factors, and family history of disease. Counseling  Your health care provider may ask you questions about your: Alcohol use. Tobacco use. Drug use. Emotional well-being. Home and relationship well-being. Sexual activity. Eating habits. History of falls. Memory and ability to understand (cognition). Work and work Statistician. Screening  You may have the following tests or measurements: Height, weight, and BMI. Blood pressure. Lipid and cholesterol levels. These may be checked every 5 years, or more frequently if you are over 74 years old. Skin check. Lung cancer screening. You may have this screening every year starting at age 74 if you have a 30-pack-year history of smoking and currently smoke or have quit within the past 15 years. Fecal occult blood test (FOBT) of the stool. You may have this test every year starting at age 74. Flexible sigmoidoscopy or colonoscopy. You may have a sigmoidoscopy every 5 years or a colonoscopy every 10 years starting at age 74. Prostate cancer screening. Recommendations will vary depending on your family history and other risks. Hepatitis C blood test. Hepatitis B blood test. Sexually transmitted disease (STD) testing. Diabetes screening. This is done by checking your blood sugar (glucose) after you have not eaten for a while (fasting). You may have this done every 1-3 years. Abdominal aortic aneurysm (AAA) screening. You may need this if you are a current or former smoker. Osteoporosis. You may be screened starting at age 74 if you are at high risk. Talk with your health care provider about your test results, treatment options, and if necessary, the need for more tests. Vaccines  Your health care  provider may recommend certain vaccines, such as: Influenza vaccine. This is recommended every year. Tetanus, diphtheria, and acellular pertussis (Tdap, Td) vaccine. You may need a Td booster every 10 years. Zoster vaccine. You may need this after age 50. Pneumococcal 13-valent conjugate (PCV13) vaccine. One dose is recommended after age 60. Pneumococcal polysaccharide (PPSV23) vaccine. One dose is recommended after age 36. Talk to your health care provider about which screenings and vaccines you need and how often you need them. This information is not intended to replace advice given to you by your health care provider. Make sure you discuss any questions you have with your health care provider. Document Released: 03/17/2015 Document Revised: 11/08/2015 Document Reviewed: 12/20/2014 Elsevier Interactive Patient Education  2017 [place] Prevention in the Home Falls can cause injuries. They can happen to people of all ages. There are many things you can do to make your home safe and to help prevent falls. What can I do on the outside of my home? Regularly fix the edges of walkways and driveways and fix any cracks. Remove anything that might make you trip as you walk through a door, such as a raised step or threshold. Trim any bushes or trees on the path to your home. Use bright outdoor lighting. Clear any walking paths of anything that might make someone trip, such as rocks or tools. Regularly check to see if handrails are loose or broken. Make sure that both sides of any steps have handrails. Any raised decks and porches should have guardrails on the edges. Have any leaves, snow, or ice cleared regularly. Use sand or salt on walking paths during winter. Clean up any spills in your garage right away. This includes oil or grease spills. What can I do in the bathroom? Use night lights. Install grab bars by the toilet and in the tub and shower. Do not use towel bars as grab  bars. Use non-skid mats or decals in the tub or shower. If you need to sit down in the shower, use a plastic, non-slip stool. Keep the floor dry. Clean up any water that spills on the floor as soon as it happens. Remove soap buildup in the tub or shower regularly. Attach bath mats securely with double-sided non-slip rug tape. Do not have throw rugs and other things on the floor that can make you trip. What can I do in the bedroom? Use night lights. Make sure that you have a light by your bed that is easy to reach. Do not use any sheets or blankets that are too big for your bed. They should not hang down onto the floor. Have a firm chair that has side arms. You can use this for support while you get dressed. Do not have throw rugs and other things on the floor that can make you trip. What can I do in the kitchen? Clean up any spills right away. Avoid walking on wet floors. Keep items that you use a lot in easy-to-reach places. If you need to reach something above you, use a strong step stool that has a grab bar. Keep electrical cords out of the way. Do not use floor polish or wax that makes floors slippery. If you must use wax, use non-skid floor wax. Do not have throw rugs and other things on the floor that can make you trip. What can I do with my stairs? Do not leave any items on the stairs. Make sure that there are  handrails on both sides of the stairs and use them. Fix handrails that are broken or loose. Make sure that handrails are as long as the stairways. Check any carpeting to make sure that it is firmly attached to the stairs. Fix any carpet that is loose or worn. Avoid having throw rugs at the top or bottom of the stairs. If you do have throw rugs, attach them to the floor with carpet tape. Make sure that you have a light switch at the top of the stairs and the bottom of the stairs. If you do not have them, ask someone to add them for you. What else can I do to help prevent  falls? Wear shoes that: Do not have high heels. Have rubber bottoms. Are comfortable and fit you well. Are closed at the toe. Do not wear sandals. If you use a stepladder: Make sure that it is fully opened. Do not climb a closed stepladder. Make sure that both sides of the stepladder are locked into place. Ask someone to hold it for you, if possible. Clearly mark and make sure that you can see: Any grab bars or handrails. First and last steps. Where the edge of each step is. Use tools that help you move around (mobility aids) if they are needed. These include: Canes. Walkers. Scooters. Crutches. Turn on the lights when you go into a dark area. Replace any light bulbs as soon as they burn out. Set up your furniture so you have a clear path. Avoid moving your furniture around. If any of your floors are uneven, fix them. If there are any pets around you, be aware of where they are. Review your medicines with your doctor. Some medicines can make you feel dizzy. This can increase your chance of falling. Ask your doctor what other things that you can do to help prevent falls. This information is not intended to replace advice given to you by your health care provider. Make sure you discuss any questions you have with your health care provider. Document Released: 12/15/2008 Document Revised: 07/27/2015 Document Reviewed: 03/25/2014 Elsevier Interactive Patient Education  2017 [employer].
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
## 2021-01-11 DIAGNOSIS — E1065 Type 1 diabetes mellitus with hyperglycemia: Secondary | ICD-10-CM | POA: Diagnosis not present
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
## 2021-01-22 ENCOUNTER — Encounter: Payer: Self-pay | Admitting: Cardiovascular Disease

## 2021-01-22 ENCOUNTER — Other Ambulatory Visit: Payer: Self-pay

## 2021-01-22 ENCOUNTER — Ambulatory Visit: Payer: Medicare PPO | Admitting: Cardiovascular Disease

## 2021-01-22 VITALS — BP 112/72 | HR 66 | Ht 66.0 in | Wt 268.0 lb

## 2021-01-22 DIAGNOSIS — I480 Paroxysmal atrial fibrillation: Secondary | ICD-10-CM

## 2021-01-22 DIAGNOSIS — Z7901 Long term (current) use of anticoagulants: Secondary | ICD-10-CM | POA: Diagnosis not present

## 2021-01-22 DIAGNOSIS — I10 Essential (primary) hypertension: Secondary | ICD-10-CM

## 2021-01-22 NOTE — Patient Instructions (Signed)
Medication Instructions:  The current medical regimen is effective;  continue present plan and medications.  *If you need a refill on your cardiac medications before your next appointment, please call your pharmacy*  Follow-Up: At [facility], you and your health needs are our priority.  As part of our continuing mission to provide you with exceptional heart care, we have created designated Provider Care Teams.  These Care Teams include your primary Cardiologist (physician) and Advanced Practice Providers (APPs -  Physician Assistants and Nurse Practitioners) who all work together to provide you with the care you need, when you need it.  We recommend signing up for the patient portal called "MyChart".  Sign up information is provided on this After Visit Summary.  MyChart is used to connect with patients for Virtual Visits (Telemedicine).  Patients are able to view lab/test results, encounter notes, upcoming appointments, etc.  Non-urgent messages can be sent to your provider as well.   To learn more about what you can do with MyChart, go to [email or web address].    Your next appointment:   1 year(s)  The format for your next appointment:   In Person  Provider:   Peter Nishan, MD     Thank you for choosing [facility]!!    

## 2021-02-07 ENCOUNTER — Other Ambulatory Visit: Payer: Self-pay | Admitting: Internal Medicine

## 2021-02-07 NOTE — Telephone Encounter (Signed)
Please refill as per office routine med refill policy (all routine meds to be refilled for 3 mo or monthly (per pt preference) up to one year from last visit, then month to month grace period for 3 mo, then further med refills will have to be denied) ? ?

## 2021-03-26 ENCOUNTER — Other Ambulatory Visit: Payer: Self-pay

## 2021-03-26 ENCOUNTER — Other Ambulatory Visit (INDEPENDENT_AMBULATORY_CARE_PROVIDER_SITE_OTHER): Payer: Medicare PPO

## 2021-03-26 DIAGNOSIS — E559 Vitamin D deficiency, unspecified: Secondary | ICD-10-CM | POA: Diagnosis not present

## 2021-03-26 DIAGNOSIS — E1169 Type 2 diabetes mellitus with other specified complication: Secondary | ICD-10-CM

## 2021-03-26 DIAGNOSIS — E538 Deficiency of other specified B group vitamins: Secondary | ICD-10-CM

## 2021-03-26 DIAGNOSIS — E785 Hyperlipidemia, unspecified: Secondary | ICD-10-CM

## 2021-03-26 DIAGNOSIS — Z0000 Encounter for general adult medical examination without abnormal findings: Secondary | ICD-10-CM | POA: Diagnosis not present

## 2021-03-26 LAB — BASIC METABOLIC PANEL
BUN: 18 mg/dL (ref 6–23)
CO2: 25 mEq/L (ref 19–32)
Calcium: 8.9 mg/dL (ref 8.4–10.5)
Chloride: 104 mEq/L (ref 96–112)
Creatinine, Ser: 0.95 mg/dL (ref 0.40–1.50)
GFR: 78.99 mL/min (ref >60.00)
Glucose, Bld: 116 mg/dL — ABNORMAL HIGH (ref 70–99)
Potassium: 4.8 mEq/L (ref 3.5–5.1)
Sodium: 140 mEq/L (ref 135–145)

## 2021-03-26 LAB — HEPATIC FUNCTION PANEL
ALT: 14 U/L (ref 0–53)
AST: 14 U/L (ref 0–37)
Albumin: 3.9 g/dL (ref 3.5–5.2)
Alkaline Phosphatase: 65 U/L (ref 39–117)
Bilirubin, Direct: 0.1 mg/dL (ref 0.0–0.3)
Total Bilirubin: 0.3 mg/dL (ref 0.2–1.2)
Total Protein: 6.5 g/dL (ref 6.0–8.3)

## 2021-03-26 LAB — CBC WITH DIFFERENTIAL/PLATELET
Basophils Absolute: 0.1 10*3/uL (ref 0.0–0.1)
Basophils Relative: 0.9 % (ref 0.0–3.0)
Eosinophils Absolute: 0.2 10*3/uL (ref 0.0–0.7)
Eosinophils Relative: 3 % (ref 0.0–5.0)
HCT: 43.1 % (ref 39.0–52.0)
Hemoglobin: 13.6 g/dL (ref 13.0–17.0)
Lymphocytes Relative: 24.4 % (ref 12.0–46.0)
Lymphs Abs: 1.5 10*3/uL (ref 0.7–4.0)
MCHC: 31.6 g/dL (ref 30.0–36.0)
MCV: 85.9 fl (ref 78.0–100.0)
Monocytes Absolute: 0.6 10*3/uL (ref 0.1–1.0)
Monocytes Relative: 10.1 % (ref 3.0–12.0)
Neutro Abs: 3.9 10*3/uL (ref 1.4–7.7)
Neutrophils Relative %: 61.6 % (ref 43.0–77.0)
Platelets: 253 10*3/uL (ref 150.0–400.0)
RBC: 5.02 Mil/uL (ref 4.22–5.81)
RDW: 15.5 % (ref 11.5–15.5)
WBC: 6.3 10*3/uL (ref 4.0–10.5)

## 2021-03-26 LAB — URINALYSIS, ROUTINE W REFLEX MICROSCOPIC
Bilirubin Urine: NEGATIVE
Hgb urine dipstick: NEGATIVE
Ketones, ur: NEGATIVE
Leukocytes,Ua: NEGATIVE
Nitrite: NEGATIVE
RBC / HPF: NONE SEEN (ref 0–?)
Specific Gravity, Urine: >=1.03 — AB (ref 1.000–1.030)
Total Protein, Urine: NEGATIVE
Urine Glucose: NEGATIVE
Urobilinogen, UA: 0.2 (ref 0.0–1.0)
WBC, UA: NONE SEEN (ref 0–?)
pH: 5.5 (ref 5.0–8.0)

## 2021-03-26 LAB — LIPID PANEL
Cholesterol: 104 mg/dL (ref 0–200)
HDL: 31.9 mg/dL — ABNORMAL LOW (ref >39.00)
LDL Cholesterol: 44 mg/dL (ref 0–99)
NonHDL: 72.24
Total CHOL/HDL Ratio: 3
Triglycerides: 139 mg/dL (ref 0.0–149.0)
VLDL: 27.8 mg/dL (ref 0.0–40.0)

## 2021-03-26 LAB — HEMOGLOBIN A1C: Hgb A1c MFr Bld: 6.4 % (ref 4.6–6.5)

## 2021-03-26 LAB — TSH: TSH: 3.61 u[IU]/mL (ref 0.35–5.50)

## 2021-03-26 LAB — MICROALBUMIN / CREATININE URINE RATIO
Creatinine,U: 205 mg/dL
Microalb Creat Ratio: 1.3 mg/g (ref 0.0–30.0)
Microalb, Ur: 2.7 mg/dL — ABNORMAL HIGH (ref 0.0–1.9)

## 2021-03-26 LAB — VITAMIN D 25 HYDROXY (VIT D DEFICIENCY, FRACTURES): VITD: 39.58 ng/mL (ref 30.00–100.00)

## 2021-03-26 LAB — PSA: PSA: 2.46 ng/mL (ref 0.10–4.00)

## 2021-03-26 LAB — VITAMIN B12: Vitamin B-12: 1214 pg/mL — ABNORMAL HIGH (ref 211–911)

## 2021-03-29 ENCOUNTER — Ambulatory Visit: Payer: Medicare PPO | Admitting: Internal Medicine

## 2021-03-29 ENCOUNTER — Other Ambulatory Visit: Payer: Self-pay

## 2021-03-29 ENCOUNTER — Encounter: Payer: Self-pay | Admitting: Internal Medicine

## 2021-03-29 VITALS — BP 114/70 | HR 71 | Ht 66.0 in | Wt 271.0 lb

## 2021-03-29 DIAGNOSIS — Z7901 Long term (current) use of anticoagulants: Secondary | ICD-10-CM | POA: Diagnosis not present

## 2021-03-29 DIAGNOSIS — Z0001 Encounter for general adult medical examination with abnormal findings: Secondary | ICD-10-CM | POA: Diagnosis not present

## 2021-03-29 DIAGNOSIS — R0789 Other chest pain: Secondary | ICD-10-CM | POA: Diagnosis not present

## 2021-03-29 DIAGNOSIS — E559 Vitamin D deficiency, unspecified: Secondary | ICD-10-CM | POA: Diagnosis not present

## 2021-03-29 DIAGNOSIS — E1169 Type 2 diabetes mellitus with other specified complication: Secondary | ICD-10-CM

## 2021-03-29 DIAGNOSIS — M25561 Pain in right knee: Secondary | ICD-10-CM

## 2021-03-29 DIAGNOSIS — E538 Deficiency of other specified B group vitamins: Secondary | ICD-10-CM | POA: Diagnosis not present

## 2021-03-29 DIAGNOSIS — I480 Paroxysmal atrial fibrillation: Secondary | ICD-10-CM

## 2021-03-29 DIAGNOSIS — Z96651 Presence of right artificial knee joint: Secondary | ICD-10-CM

## 2021-03-29 DIAGNOSIS — G8929 Other chronic pain: Secondary | ICD-10-CM

## 2021-03-29 DIAGNOSIS — E785 Hyperlipidemia, unspecified: Secondary | ICD-10-CM

## 2021-03-29 MED ORDER — TRAMADOL HCL 50 MG PO TABS: 50.0000 mg | ORAL_TABLET | Freq: Four times a day (QID) | ORAL | 2 refills | Status: DC | PRN
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
## 2021-03-29 NOTE — Patient Instructions (Signed)
Ok to reduce the B12 pill to every other day  Ok to increase the Vit D3 to 4000 units per day  Please continue all other medications as before, and refills have been done if requested - the tramado  Please have the pharmacy call with any other refills you may need.  Please continue your efforts at being more active, low cholesterol diet, and weight control.  You are otherwise up to date with prevention measures today.  Please keep your appointments with your specialists as you may have planned  Please make an Appointment to return in 6 months, or sooner if needed, also with Lab Appointment for testing done 3-5 days before at the [place] (so this is for TWO appointments - please see the scheduling desk as you leave)  Due to the ongoing Covid 19 pandemic, our lab now requires an appointment for any labs done at our office.  If you need labs done and do not have an appointment, please call our office ahead of time to schedule before presenting to the lab for your testing.

## 2021-03-31 ENCOUNTER — Encounter: Payer: Self-pay | Admitting: Internal Medicine

## 2021-03-31 ENCOUNTER — Other Ambulatory Visit: Payer: Self-pay | Admitting: Cardiovascular Disease

## 2021-03-31 NOTE — Assessment & Plan Note (Signed)
.  Age and sex appropriate education and counseling updated with regular exercise and diet Referrals for preventative services - none needed Immunizations addressed - declines shingrix Smoking counseling  - none needed Evidence for depression or other mood disorder - increased anxiety situational, declines need for further tx or referral Most recent labs reviewed. I have personally reviewed and have noted: 1) the patient's medical and social history 2) The patient's current medications and supplements 3) The patient's height, weight, and BMI have been recorded in the chart

## 2021-03-31 NOTE — Assessment & Plan Note (Signed)
. °  Lab Results  Component Value Date   HGBA1C 6.4 03/26/2021   Stable, pt to continue current medical treatment metformin

## 2021-03-31 NOTE — Assessment & Plan Note (Signed)
Lab Results  Component Value Date   VITAMINB12 1,214 (H) 03/26/2021   Mild overcontrolled,, to cont oral replacement - b12 1000 mcg but reduce to qod

## 2021-03-31 NOTE — Assessment & Plan Note (Signed)
Stable, cont eliquis

## 2021-03-31 NOTE — Assessment & Plan Note (Signed)
Exam benign, suspect msk, low cardiac suspicion, ecg reviewed,  to f/u any worsening symptoms or concerns

## 2021-03-31 NOTE — Assessment & Plan Note (Signed)
Chronic stable, for tramadol refill

## 2021-03-31 NOTE — Assessment & Plan Note (Signed)
Last vitamin D Lab Results  Component Value Date   VD25OH 39.58 03/26/2021   Low uncontrolled on 2000 u qd, pt to increase oral replacement to 4000 u qd

## 2021-03-31 NOTE — Assessment & Plan Note (Signed)
Stable rate and volume,  to f/u any worsening symptoms or concerns  

## 2021-04-02 DIAGNOSIS — E1065 Type 1 diabetes mellitus with hyperglycemia: Secondary | ICD-10-CM | POA: Diagnosis not present

## 2021-04-02 NOTE — Telephone Encounter (Signed)
Eliquis 5 mg refill request received. Patient is 76 years old, weight- 122.9 kg, Crea- 0.95 on 03/26/21, Diagnosis- PAF, and last seen by Dr. Johnsie Cancel on 01/22/21. Dose is appropriate based on dosing criteria. Will send in refill to requested pharmacy.

## 2021-04-10 ENCOUNTER — Other Ambulatory Visit: Payer: Self-pay | Admitting: Cardiovascular Disease

## 2021-04-13 DIAGNOSIS — C44319 Basal cell carcinoma of skin of other parts of face: Secondary | ICD-10-CM | POA: Diagnosis not present

## 2021-04-13 DIAGNOSIS — L570 Actinic keratosis: Secondary | ICD-10-CM | POA: Diagnosis not present

## 2021-04-13 DIAGNOSIS — C44329 Squamous cell carcinoma of skin of other parts of face: Secondary | ICD-10-CM | POA: Diagnosis not present

## 2021-04-24 ENCOUNTER — Encounter: Payer: Self-pay | Admitting: Cardiology

## 2021-04-24 ENCOUNTER — Other Ambulatory Visit: Payer: Self-pay

## 2021-04-24 ENCOUNTER — Ambulatory Visit: Payer: Medicare PPO | Admitting: Cardiology

## 2021-04-24 VITALS — BP 112/72 | HR 75 | Ht 66.0 in | Wt 270.6 lb

## 2021-04-24 DIAGNOSIS — I4819 Other persistent atrial fibrillation: Secondary | ICD-10-CM

## 2021-04-24 NOTE — Patient Instructions (Signed)
Medication Instructions:  Your physician recommends that you continue on your current medications as directed. Please refer to the Current Medication list given to you today.  *If you need a refill on your cardiac medications before your next appointment, please call your pharmacy*   Lab Work: None ordered   Testing/Procedures: None ordered   Follow-Up: At [facility], you and your health needs are our priority.  As part of our continuing mission to provide you with exceptional heart care, we have created designated Provider Care Teams.  These Care Teams include your primary Cardiologist (physician) and Advanced Practice Providers (APPs -  Physician Assistants and Nurse Practitioners) who all work together to provide you with the care you need, when you need it.  Your next appointment:   3 month(s)  The format for your next appointment:   In Person  Provider:   Will Camnitz, MD    Thank you for choosing [facility]!!   Vermon Grays, RN [phone]     
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
## 2021-05-28 ENCOUNTER — Ambulatory Visit: Payer: Medicare PPO | Admitting: Cardiology

## 2021-06-12 ENCOUNTER — Encounter: Payer: Self-pay | Admitting: Internal Medicine

## 2021-06-12 ENCOUNTER — Other Ambulatory Visit: Payer: Self-pay

## 2021-06-12 ENCOUNTER — Ambulatory Visit: Payer: Medicare PPO | Admitting: Internal Medicine

## 2021-06-12 VITALS — BP 125/75 | HR 61 | Temp 97.8°F | Wt 269.0 lb

## 2021-06-12 DIAGNOSIS — T8453XD Infection and inflammatory reaction due to internal right knee prosthesis, subsequent encounter: Secondary | ICD-10-CM

## 2021-06-12 DIAGNOSIS — Z5181 Encounter for therapeutic drug level monitoring: Secondary | ICD-10-CM

## 2021-06-12 MED ORDER — CEPHALEXIN 500 MG PO CAPS: 1000.0000 mg | ORAL_CAPSULE | Freq: Two times a day (BID) | ORAL | 3 refills | Status: DC

## 2021-06-12 MED ORDER — SULFAMETHOXAZOLE-TRIMETHOPRIM 800-160 MG PO TABS: 1.0000 | ORAL_TABLET | Freq: Every day | ORAL | 3 refills | Status: DC

## 2021-06-12 NOTE — Assessment & Plan Note (Signed)
He continues to clinically remain stable and tolerating the antibiotics well.  At this point, I will have him transition to once daily oral Bactrim to minimize the chance of renal issues to give him a better chance of remaining on it.  He will continue with cephalexin twice a day.   ?Follow up in 3 months ?

## 2021-06-12 NOTE — Assessment & Plan Note (Signed)
Will check his bmp today ?

## 2021-06-12 NOTE — Progress Notes (Signed)
? ?  Subjective:  ? ? Patient ID: Eric Santos, male    DOB: [birth year], 76 y.o.   MRN: [id number] ? 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 Mayer Camel in November 2020 due to infection and culture with CoNS, oxacillin sensitive, clinda, tetracycline and TMP/SMX resistant.  He was on daptomycin for nearly 6 weeks but due to side effects was stopped a bit early.  He was then placed on Keflex after that, initially 4 times a day then reduced to 1 gram twice a day.  He then later last year developed more leg swelling and noted instability of his prosthetic joint and sent to Dr. Elisabeth Most in [place] who performed a revision of the total knee on 01/05/20.   ?   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  He was then changed to Bactrim and has been on that since that time and tolerating well.   ? ?He continues on both Bactrim and Keflex and still good tolerance.  He had a creat done in January by Dr. Jenny Reichmann and no issues.  No rash.  Knee stable, no new issues.  No fever, no diarrhea.  ? ? ?Review of Systems  ?Constitutional:  Negative for fever.  ?Gastrointestinal:  Negative for diarrhea.  ?Skin:  Negative for rash.  ? ?   ?Objective:  ? Physical Exam ?Eyes:  ?   General: No scleral icterus. ?Pulmonary:  ?   Effort: Pulmonary effort is normal.  ?Musculoskeletal:     ?   General: Normal range of motion.  ?   Comments: Right knee with stable effusion, no warmth, no tenderness  ?Neurological:  ?   Mental Status: He is alert.  ?Psychiatric:     ?   Mood and Affect: Mood normal.  ? ? ? ?   ?Assessment & Plan:  ? ?

## 2021-06-13 LAB — BASIC METABOLIC PANEL
BUN: 16 mg/dL (ref 7–25)
CO2: 25 mmol/L (ref 20–32)
Calcium: 9.6 mg/dL (ref 8.6–10.3)
Chloride: 105 mmol/L (ref 98–110)
Creat: 0.89 mg/dL (ref 0.70–1.28)
Glucose, Bld: 99 mg/dL (ref 65–99)
Potassium: 4.8 mmol/L (ref 3.5–5.3)
Sodium: 141 mmol/L (ref 135–146)

## 2021-06-23 ENCOUNTER — Other Ambulatory Visit: Payer: Self-pay | Admitting: Cardiology

## 2021-06-23 ENCOUNTER — Other Ambulatory Visit: Payer: Self-pay | Admitting: Internal Medicine

## 2021-06-23 NOTE — Telephone Encounter (Signed)
Please refill as per office routine med refill policy (all routine meds to be refilled for 3 mo or monthly (per pt preference) up to one year from last visit, then month to month grace period for 3 mo, then further med refills will have to be denied) ? ?

## 2021-06-27 ENCOUNTER — Telehealth: Payer: Self-pay | Admitting: Internal Medicine

## 2021-06-27 NOTE — Telephone Encounter (Signed)
1.Medication Requested: Continuous Blood Gluc Sensor (FREESTYLE LIBRE 14 DAY SENSOR) MISC ? ?2. Pharmacy (Name, [facility]): [place], [place] - [address], [place] ? ?3. On Med List: Y ? ?4. Last Visit with PCP: 03-29-2021 ? ?5. Next visit date with PCP: 09-27-2021 ? ? ?Agent: Please be advised that RX refills may take up to 3 business days. We ask that you follow-up with your pharmacy.  ?

## 2021-06-28 MED ORDER — FREESTYLE LIBRE 14 DAY SENSOR MISC
3 refills | Status: AC
Start: 2021-06-28 — End: ?

## 2021-06-28 NOTE — Telephone Encounter (Signed)
Pt had cpx in January.. sent sensor to [facility] ?

## 2021-08-06 ENCOUNTER — Ambulatory Visit: Payer: Medicare PPO | Admitting: Cardiology

## 2021-08-06 ENCOUNTER — Encounter: Payer: Self-pay | Admitting: Cardiology

## 2021-08-06 VITALS — BP 122/78 | HR 66 | Ht 66.0 in | Wt 265.0 lb

## 2021-08-06 DIAGNOSIS — I4819 Other persistent atrial fibrillation: Secondary | ICD-10-CM | POA: Diagnosis not present

## 2021-08-06 DIAGNOSIS — D6869 Other thrombophilia: Secondary | ICD-10-CM | POA: Diagnosis not present

## 2021-08-06 MED ORDER — FLECAINIDE ACETATE 50 MG PO TABS: 50.0000 mg | ORAL_TABLET | Freq: Two times a day (BID) | ORAL | 6 refills | Status: DC

## 2021-08-06 NOTE — Patient Instructions (Addendum)
Medication Instructions:  Your physician has recommended you make the following change in your medication:  START Flecainide 50 mg once daily  *If you need a refill on your cardiac medications before your next appointment, please call your pharmacy*   Lab Work: None ordered   Testing/Procedures: None ordered   Follow-Up: At [facility], you and your health needs are our priority.  As part of our continuing mission to provide you with exceptional heart care, we have created designated Provider Care Teams.  These Care Teams include your primary Cardiologist (physician) and Advanced Practice Providers (APPs -  Physician Assistants and Nurse Practitioners) who all work together to provide you with the care you need, when you need it.  Your next appointment:   6 month(s)  The format for your next appointment:   In Person  Provider:   Allegra Lai, MD    Thank you for choosing [facility]!!   Trinidad Curet, RN [phone]  Other Instructions   Important Information About Sugar      Flecainide Tablets What is this medication? FLECAINIDE (FLEK a nide) prevents and treats a fast or irregular heartbeat (arrhythmia). It is often used to treat a type of arrhythmia known as AFib (atrial fibrillation). It works by slowing down overactive electric signals in the heart, which stabilizes your heart rhythm. It belongs to a group of medications called antiarrhythmics. This medicine may be used for other purposes; ask your health care provider or pharmacist if you have questions. COMMON BRAND NAME(S): Tambocor What should I tell my care team before I take this medication? They need to know if you have any of these conditions: Abnormal levels of potassium in the blood Heart disease including heart rhythm and heart rate problems Kidney or liver disease Recent heart attack An unusual or allergic reaction to flecainide, local anesthetics, other medications, foods, dyes, or  preservatives Pregnant or trying to get pregnant Breast-feeding How should I use this medication? Take this medication by mouth with a glass of water. Follow the directions on the prescription label. You can take this medication with or without food. Take your doses at regular intervals. Do not take your medication more often than directed. Do not stop taking this medication suddenly. This may cause serious, heart-related side effects. If your care team wants you to stop the medication, the dose may be slowly lowered over time to avoid any side effects. Talk to your care team regarding the use of this medication in children. While this medication may be prescribed for children as young as 1 year of age for selected conditions, precautions do apply. Overdosage: If you think you have taken too much of this medicine contact a poison control center or emergency room at once. NOTE: This medicine is only for you. Do not share this medicine with others. What if I miss a dose? If you miss a dose, take it as soon as you can. If it is almost time for your next dose, take only that dose. Do not take double or extra doses. What may interact with this medication? Do not take this medication with any of the following: Amoxapine Arsenic trioxide Certain antibiotics like clarithromycin, erythromycin, gatifloxacin, gemifloxacin, levofloxacin, moxifloxacin, sparfloxacin, or troleandomycin Certain antidepressants called tricyclic antidepressants like amitriptyline, imipramine, or nortriptyline Certain medications to control heart rhythm like disopyramide, encainide, moricizine, procainamide, propafenone, and quinidine Cisapride Delavirdine Droperidol Haloperidol [place] Imatinib Levomethadyl Maprotiline Medications for malaria like chloroquine and halofantrine Pentamidine Phenothiazines like chlorpromazine, mesoridazine, prochlorperazine,  thioridazine  Pimozide Quinine Ranolazine Ritonavir Sertindole This medication may also interact with the following: Cimetidine Dofetilide Medications for angina or high blood pressure Medications to control heart rhythm like amiodarone and digoxin Ziprasidone This list may not describe all possible interactions. Give your health care provider a list of all the medicines, herbs, non-prescription drugs, or dietary supplements you use. Also tell them if you smoke, drink alcohol, or use illegal drugs. Some items may interact with your medicine. What should I watch for while using this medication? Visit your care team for regular checks on your progress. Because your condition and the use of this medication carries some risk, it is a good idea to carry an identification card, necklace or bracelet with details of your condition, medications, and care team. Check your blood pressure and pulse rate regularly. Ask your care team what your blood pressure and pulse rate should be, and when you should contact them. Your care team also may schedule regular blood tests and electrocardiograms to check your progress. You may get drowsy or dizzy. Do not drive, use machinery, or do anything that needs mental alertness until you know how this medication affects you. Do not stand or sit up quickly, especially if you are an older patient. This reduces the risk of dizzy or fainting spells. Alcohol can make you more dizzy, increase flushing and rapid heartbeats. Avoid alcoholic drinks. What side effects may I notice from receiving this medication? Side effects that you should report to your care team as soon as possible: Allergic reactions--skin rash, itching, hives, swelling of the face, lips, tongue, or throat Heart failure--shortness of breath, swelling of the ankles, feet, or hands, sudden weight gain, unusual weakness or fatigue Heart rhythm changes--fast or irregular heartbeat, dizziness, feeling faint or  lightheaded, chest pain, trouble breathing Liver injury--right upper belly pain, loss of appetite, nausea, light-colored stool, dark yellow or brown urine, yellowing skin or eyes, unusual weakness or fatigue Side effects that usually do not require medical attention (report to your care team if they continue or are bothersome): Blurry vision Constipation Dizziness Fatigue Headache Nausea Tremors or shaking This list may not describe all possible side effects. Call your doctor for medical advice about side effects. You may report side effects to FDA at 1-800-FDA-1088. Where should I keep my medication? Keep out of the reach of children and pets. Store at room temperature between 15 and 30 degrees C (59 and 86 degrees F). Protect from light. Keep container tightly closed. Throw away any unused medication after the expiration date. NOTE: This sheet is a summary. It may not cover all possible information. If you have questions about this medicine, talk to your doctor, pharmacist, or health care provider.  2023 Elsevier/Gold Standard (2020-04-14 00:00:00)
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
## 2021-09-14 ENCOUNTER — Telehealth: Payer: Self-pay | Admitting: Internal Medicine

## 2021-09-14 NOTE — Telephone Encounter (Signed)
Patient notified that lab orders are placed

## 2021-09-14 NOTE — Telephone Encounter (Signed)
Pt is requesting lab orders. Has physical appointment on 09/27/21. Pt stated provider told him to have labs done on the Monday before his appointment.

## 2021-09-17 ENCOUNTER — Other Ambulatory Visit: Payer: Medicare PPO

## 2021-09-24 ENCOUNTER — Other Ambulatory Visit (INDEPENDENT_AMBULATORY_CARE_PROVIDER_SITE_OTHER): Payer: Medicare PPO

## 2021-09-24 DIAGNOSIS — E538 Deficiency of other specified B group vitamins: Secondary | ICD-10-CM

## 2021-09-24 DIAGNOSIS — E785 Hyperlipidemia, unspecified: Secondary | ICD-10-CM | POA: Diagnosis not present

## 2021-09-24 DIAGNOSIS — E559 Vitamin D deficiency, unspecified: Secondary | ICD-10-CM

## 2021-09-24 DIAGNOSIS — E1169 Type 2 diabetes mellitus with other specified complication: Secondary | ICD-10-CM | POA: Diagnosis not present

## 2021-09-24 LAB — HEPATIC FUNCTION PANEL
ALT: 19 U/L (ref 0–53)
AST: 21 U/L (ref 0–37)
Albumin: 4.3 g/dL (ref 3.5–5.2)
Alkaline Phosphatase: 78 U/L (ref 39–117)
Bilirubin, Direct: 0.1 mg/dL (ref 0.0–0.3)
Total Bilirubin: 0.4 mg/dL (ref 0.2–1.2)
Total Protein: 7.5 g/dL (ref 6.0–8.3)

## 2021-09-24 LAB — LIPID PANEL
Cholesterol: 101 mg/dL (ref 0–200)
HDL: 34.6 mg/dL — ABNORMAL LOW (ref >39.00)
LDL Cholesterol: 41 mg/dL (ref 0–99)
NonHDL: 66.83
Total CHOL/HDL Ratio: 3
Triglycerides: 131 mg/dL (ref 0.0–149.0)
VLDL: 26.2 mg/dL (ref 0.0–40.0)

## 2021-09-24 LAB — HEMOGLOBIN A1C: Hgb A1c MFr Bld: 6.7 % — ABNORMAL HIGH (ref 4.6–6.5)

## 2021-09-24 LAB — VITAMIN B12: Vitamin B-12: >1500 pg/mL — ABNORMAL HIGH (ref 211–911)

## 2021-09-24 LAB — VITAMIN D 25 HYDROXY (VIT D DEFICIENCY, FRACTURES): VITD: 56 ng/mL (ref 30.00–100.00)

## 2021-09-27 ENCOUNTER — Ambulatory Visit: Payer: Medicare PPO | Admitting: Internal Medicine

## 2021-09-27 ENCOUNTER — Encounter: Payer: Self-pay | Admitting: Internal Medicine

## 2021-09-27 VITALS — BP 112/60 | HR 57 | Temp 97.9°F | Ht 66.0 in | Wt 271.0 lb

## 2021-09-27 DIAGNOSIS — F5101 Primary insomnia: Secondary | ICD-10-CM | POA: Diagnosis not present

## 2021-09-27 DIAGNOSIS — E538 Deficiency of other specified B group vitamins: Secondary | ICD-10-CM

## 2021-09-27 DIAGNOSIS — Z860100 Personal history of colon polyps, unspecified: Secondary | ICD-10-CM | POA: Insufficient documentation

## 2021-09-27 DIAGNOSIS — E1169 Type 2 diabetes mellitus with other specified complication: Secondary | ICD-10-CM | POA: Diagnosis not present

## 2021-09-27 DIAGNOSIS — K2270 Barrett's esophagus without dysplasia: Secondary | ICD-10-CM | POA: Insufficient documentation

## 2021-09-27 DIAGNOSIS — T84012D Broken internal right knee prosthesis, subsequent encounter: Secondary | ICD-10-CM

## 2021-09-27 DIAGNOSIS — E559 Vitamin D deficiency, unspecified: Secondary | ICD-10-CM

## 2021-09-27 DIAGNOSIS — R1032 Left lower quadrant pain: Secondary | ICD-10-CM | POA: Insufficient documentation

## 2021-09-27 DIAGNOSIS — Z125 Encounter for screening for malignant neoplasm of prostate: Secondary | ICD-10-CM | POA: Diagnosis not present

## 2021-09-27 DIAGNOSIS — E785 Hyperlipidemia, unspecified: Secondary | ICD-10-CM

## 2021-09-27 MED ORDER — TRAZODONE HCL 50 MG PO TABS: 25.0000 mg | ORAL_TABLET | Freq: Every evening | ORAL | 1 refills | Status: DC | PRN

## 2021-09-27 MED ORDER — TRAMADOL HCL 50 MG PO TABS: 50.0000 mg | ORAL_TABLET | Freq: Four times a day (QID) | ORAL | 5 refills | Status: DC | PRN

## 2021-09-27 MED ORDER — METFORMIN HCL 500 MG PO TABS
500.0000 mg | ORAL_TABLET | Freq: Two times a day (BID) | ORAL | 3 refills | Status: DC | PRN
Start: 2021-09-27 — End: 2023-04-01
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
## 2021-09-27 NOTE — Patient Instructions (Signed)
Ok to restart the metformin  Please continue all other medications as before, and refills have been done if requested.  Please have the pharmacy call with any other refills you may need.  Please continue your efforts at being more active, low cholesterol diet, and weight control.  Please keep your appointments with your specialists as you may have planned  Please make an Appointment to return in 6 months, or sooner if needed, also with Lab Appointment for testing done 3-5 days before at the [facility] (so this is for TWO appointments - please see the scheduling desk as you leave)

## 2021-09-29 ENCOUNTER — Encounter: Payer: Self-pay | Admitting: Internal Medicine

## 2021-09-29 NOTE — Assessment & Plan Note (Signed)
Last vitamin D Lab Results  Component Value Date   VD25OH 56.00 09/24/2021   Stable, cont oral replacement

## 2021-09-29 NOTE — Assessment & Plan Note (Signed)
Lab Results  Component Value Date   HGBA1C 6.7 (H) 09/24/2021   Mild uncontrolled, pt to restart metformin 500 bid

## 2021-09-29 NOTE — Assessment & Plan Note (Signed)
With mild worsening, ok for otc melatonin

## 2021-09-29 NOTE — Assessment & Plan Note (Signed)
D/w pt, declines further ortho f/u for now

## 2021-10-03 ENCOUNTER — Other Ambulatory Visit: Payer: Self-pay

## 2021-10-03 ENCOUNTER — Encounter: Payer: Self-pay | Admitting: Internal Medicine

## 2021-10-03 ENCOUNTER — Ambulatory Visit: Payer: Medicare PPO | Admitting: Internal Medicine

## 2021-10-03 VITALS — BP 119/79 | HR 61 | Ht 66.0 in | Wt 270.0 lb

## 2021-10-03 DIAGNOSIS — T8453XD Infection and inflammatory reaction due to internal right knee prosthesis, subsequent encounter: Secondary | ICD-10-CM

## 2021-10-03 DIAGNOSIS — Z5181 Encounter for therapeutic drug level monitoring: Secondary | ICD-10-CM

## 2021-10-03 NOTE — Assessment & Plan Note (Signed)
No concerns with the knee and he remains on chronic suppression.  No plans to stop as long as he continues to tolerate.   Follow up in 3 months.

## 2021-10-03 NOTE — Progress Notes (Signed)
   Subjective:    Patient ID: Eric Santos, male    DOB: [birth year], 76 y.o.   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 Mayer Camel in November 2020 due to infection and culture with CoNS, oxacillin sensitive, clinda, tetracycline and TMP/SMX resistant.  He was on daptomycin for nearly 6 weeks but due to side effects was stopped a bit early.  He was then placed on Keflex after that, initially 4 times a day then reduced to 1 gram twice a day.  He then later last year developed more leg swelling and noted instability of his prosthetic joint and sent to Dr. Elisabeth Most in [place] who performed a revision of the total knee on 01/05/20.      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  He was then changed to Bactrim and has been on that since that time and tolerating well.    He continues on Bactrim, now once a day and cefadroxil and no problems or concerns.  He had recent labs but no creat.  Here with his wife who just completed chemotherapy.     Review of Systems  Constitutional:  Negative for fatigue and fever.  Gastrointestinal:  Negative for diarrhea.  Skin:  Negative for rash.       Objective:   Physical Exam Eyes:     General: No scleral icterus. Pulmonary:     Effort: Pulmonary effort is normal.  Musculoskeletal:        General: Normal range of motion.     Comments: Right knee with stable effusion, no warmth, no tenderness  Neurological:     Mental Status: He is alert.  Psychiatric:        Mood and Affect: Mood normal.   SH: no tobacco      Assessment & Plan:

## 2021-10-03 NOTE — Assessment & Plan Note (Signed)
Will recheck his creat today and continue to monitor every 3 months.

## 2021-10-04 LAB — BASIC METABOLIC PANEL
BUN: 17 mg/dL (ref 7–25)
CO2: 28 mmol/L (ref 20–32)
Calcium: 9.9 mg/dL (ref 8.6–10.3)
Chloride: 104 mmol/L (ref 98–110)
Creat: 1.01 mg/dL (ref 0.70–1.28)
Glucose, Bld: 84 mg/dL (ref 65–99)
Potassium: 4.7 mmol/L (ref 3.5–5.3)
Sodium: 141 mmol/L (ref 135–146)

## 2021-10-08 DIAGNOSIS — E119 Type 2 diabetes mellitus without complications: Secondary | ICD-10-CM | POA: Diagnosis not present

## 2021-10-10 ENCOUNTER — Telehealth: Payer: Self-pay | Admitting: Internal Medicine

## 2021-10-10 NOTE — Telephone Encounter (Signed)
error 

## 2021-10-17 DIAGNOSIS — E1169 Type 2 diabetes mellitus with other specified complication: Secondary | ICD-10-CM | POA: Diagnosis not present

## 2021-10-20 DIAGNOSIS — C44311 Basal cell carcinoma of skin of nose: Secondary | ICD-10-CM | POA: Diagnosis not present

## 2021-10-20 DIAGNOSIS — C44622 Squamous cell carcinoma of skin of right upper limb, including shoulder: Secondary | ICD-10-CM | POA: Diagnosis not present

## 2021-10-20 DIAGNOSIS — C4402 Squamous cell carcinoma of skin of lip: Secondary | ICD-10-CM | POA: Diagnosis not present

## 2021-10-20 DIAGNOSIS — L820 Inflamed seborrheic keratosis: Secondary | ICD-10-CM | POA: Diagnosis not present

## 2021-11-05 ENCOUNTER — Other Ambulatory Visit: Payer: Self-pay | Admitting: Cardiovascular Disease

## 2021-11-06 NOTE — Telephone Encounter (Signed)
Prescription refill request for Eliquis received. Indication:Afib Last office visit:6/23 Scr:1.0 Age: 76 Weight:122.5 kg  Prescription refilled

## 2021-12-17 ENCOUNTER — Telehealth: Payer: Self-pay | Admitting: Internal Medicine

## 2021-12-17 NOTE — Telephone Encounter (Signed)
LVM for pt to rtn my call to schedule Awv with [facility] call back # [phone] 

## 2022-01-02 ENCOUNTER — Ambulatory Visit: Payer: Medicare PPO | Admitting: Internal Medicine

## 2022-01-02 ENCOUNTER — Other Ambulatory Visit: Payer: Self-pay

## 2022-01-02 ENCOUNTER — Encounter: Payer: Self-pay | Admitting: Internal Medicine

## 2022-01-02 VITALS — BP 121/80 | HR 61 | Resp 16 | Ht 66.0 in | Wt 275.0 lb

## 2022-01-02 DIAGNOSIS — Z23 Encounter for immunization: Secondary | ICD-10-CM | POA: Diagnosis not present

## 2022-01-02 DIAGNOSIS — T8453XD Infection and inflammatory reaction due to internal right knee prosthesis, subsequent encounter: Secondary | ICD-10-CM

## 2022-01-02 DIAGNOSIS — Z5181 Encounter for therapeutic drug level monitoring: Secondary | ICD-10-CM

## 2022-01-02 NOTE — Assessment & Plan Note (Signed)
Will check the BMP today and rtc in 3 months

## 2022-01-02 NOTE — Assessment & Plan Note (Signed)
His knee continues to be stable, active and he is tolerating the antibiotics well.  No changes indicated and he has refills.  Will continue with Bactrim and cephalexin.

## 2022-01-02 NOTE — Progress Notes (Signed)
   Subjective:    Patient ID: Eric Santos, male    DOB: [birth year], 76 y.o.   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 Mayer Camel in November 2020 due to infection and culture with CoNS, oxacillin sensitive, clinda, tetracycline and TMP/SMX resistant.  He was on daptomycin for nearly 6 weeks but due to side effects was stopped a bit early.  He was then placed on Keflex after that, initially 4 times a day then reduced to 1 gram twice a day.  He then later last year developed more leg swelling and noted instability of his prosthetic joint and sent to Dr. Elisabeth Most in [place] who performed a revision of the total knee on 01/05/20.      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  He was then changed to Bactrim and has been on that since that time and tolerating well.    He continues on Bactrim and cephalexin with no issues.  No rash or significant diarrhea.  Creat has remained stable.  Continues to be active outside.     Review of Systems  Constitutional:  Negative for fatigue.  Gastrointestinal:  Negative for diarrhea and nausea.  Skin:  Negative for rash.       Objective:   Physical Exam Eyes:     General: No scleral icterus. Pulmonary:     Effort: Pulmonary effort is normal.  Musculoskeletal:        General: Normal range of motion.  Neurological:     Mental Status: He is alert.  Psychiatric:        Mood and Affect: Mood normal.         Assessment & Plan:

## 2022-01-03 LAB — BASIC METABOLIC PANEL
BUN: 18 mg/dL (ref 7–25)
CO2: 30 mmol/L (ref 20–32)
Calcium: 9.8 mg/dL (ref 8.6–10.3)
Chloride: 104 mmol/L (ref 98–110)
Creat: 0.92 mg/dL (ref 0.70–1.28)
Glucose, Bld: 112 mg/dL — ABNORMAL HIGH (ref 65–99)
Potassium: 5.1 mmol/L (ref 3.5–5.3)
Sodium: 141 mmol/L (ref 135–146)

## 2022-01-05 DIAGNOSIS — E1169 Type 2 diabetes mellitus with other specified complication: Secondary | ICD-10-CM | POA: Diagnosis not present

## 2022-02-12 ENCOUNTER — Telehealth: Payer: Self-pay | Admitting: Cardiology

## 2022-02-12 MED ORDER — FLECAINIDE ACETATE 50 MG PO TABS: 50.0000 mg | ORAL_TABLET | Freq: Two times a day (BID) | ORAL | 1 refills | Status: DC

## 2022-02-12 NOTE — Telephone Encounter (Signed)
Pt's medication was sent to pt's pharmacy as requested. Confirmation received.  °

## 2022-02-12 NOTE — Telephone Encounter (Signed)
*  STAT* If patient is at the pharmacy, call can be transferred to refill team.   1. Which medications need to be refilled? (please list name of each medication and dose if known)  flecainide (TAMBOCOR) 50 MG tablet  2. Which pharmacy/location (including street and city if local pharmacy) is medication to be sent to? [place], [place]  3. Do they need a 30 day or 90 day supply?  90 day supply

## 2022-02-26 ENCOUNTER — Other Ambulatory Visit: Payer: Self-pay | Admitting: Cardiology

## 2022-03-26 ENCOUNTER — Other Ambulatory Visit (INDEPENDENT_AMBULATORY_CARE_PROVIDER_SITE_OTHER): Payer: Medicare PPO

## 2022-03-26 DIAGNOSIS — E559 Vitamin D deficiency, unspecified: Secondary | ICD-10-CM

## 2022-03-26 DIAGNOSIS — Z125 Encounter for screening for malignant neoplasm of prostate: Secondary | ICD-10-CM | POA: Diagnosis not present

## 2022-03-26 DIAGNOSIS — E785 Hyperlipidemia, unspecified: Secondary | ICD-10-CM | POA: Diagnosis not present

## 2022-03-26 DIAGNOSIS — E538 Deficiency of other specified B group vitamins: Secondary | ICD-10-CM

## 2022-03-26 DIAGNOSIS — E1169 Type 2 diabetes mellitus with other specified complication: Secondary | ICD-10-CM

## 2022-03-26 LAB — HEPATIC FUNCTION PANEL
ALT: 14 U/L (ref 0–53)
AST: 15 U/L (ref 0–37)
Albumin: 4.1 g/dL (ref 3.5–5.2)
Alkaline Phosphatase: 68 U/L (ref 39–117)
Bilirubin, Direct: 0.1 mg/dL (ref 0.0–0.3)
Total Bilirubin: 0.5 mg/dL (ref 0.2–1.2)
Total Protein: 6.9 g/dL (ref 6.0–8.3)

## 2022-03-26 LAB — LIPID PANEL
Cholesterol: 105 mg/dL (ref 0–200)
HDL: 33.1 mg/dL — ABNORMAL LOW (ref >39.00)
LDL Cholesterol: 41 mg/dL (ref 0–99)
NonHDL: 72.18
Total CHOL/HDL Ratio: 3
Triglycerides: 158 mg/dL — ABNORMAL HIGH (ref 0.0–149.0)
VLDL: 31.6 mg/dL (ref 0.0–40.0)

## 2022-03-26 LAB — HEMOGLOBIN A1C: Hgb A1c MFr Bld: 6.9 % — ABNORMAL HIGH (ref 4.6–6.5)

## 2022-03-26 LAB — CBC WITH DIFFERENTIAL/PLATELET
Basophils Absolute: 0 10*3/uL (ref 0.0–0.1)
Basophils Relative: 0.5 % (ref 0.0–3.0)
Eosinophils Absolute: 0.1 10*3/uL (ref 0.0–0.7)
Eosinophils Relative: 1.5 % (ref 0.0–5.0)
HCT: 46.9 % (ref 39.0–52.0)
Hemoglobin: 15.3 g/dL (ref 13.0–17.0)
Lymphocytes Relative: 22.4 % (ref 12.0–46.0)
Lymphs Abs: 1.9 10*3/uL (ref 0.7–4.0)
MCHC: 32.6 g/dL (ref 30.0–36.0)
MCV: 87.3 fl (ref 78.0–100.0)
Monocytes Absolute: 0.9 10*3/uL (ref 0.1–1.0)
Monocytes Relative: 10.8 % (ref 3.0–12.0)
Neutro Abs: 5.5 10*3/uL (ref 1.4–7.7)
Neutrophils Relative %: 64.8 % (ref 43.0–77.0)
Platelets: 277 10*3/uL (ref 150.0–400.0)
RBC: 5.37 Mil/uL (ref 4.22–5.81)
RDW: 14.9 % (ref 11.5–15.5)
WBC: 8.5 10*3/uL (ref 4.0–10.5)

## 2022-03-26 LAB — BASIC METABOLIC PANEL
BUN: 19 mg/dL (ref 6–23)
CO2: 26 mEq/L (ref 19–32)
Calcium: 9.5 mg/dL (ref 8.4–10.5)
Chloride: 103 mEq/L (ref 96–112)
Creatinine, Ser: 0.98 mg/dL (ref 0.40–1.50)
GFR: 75.57 mL/min (ref >60.00)
Glucose, Bld: 131 mg/dL — ABNORMAL HIGH (ref 70–99)
Potassium: 5.4 mEq/L — ABNORMAL HIGH (ref 3.5–5.1)
Sodium: 140 mEq/L (ref 135–145)

## 2022-03-26 LAB — URINALYSIS, ROUTINE W REFLEX MICROSCOPIC
Bilirubin Urine: NEGATIVE
Ketones, ur: NEGATIVE
Leukocytes,Ua: NEGATIVE
Nitrite: NEGATIVE
Specific Gravity, Urine: >=1.03 — AB (ref 1.000–1.030)
Total Protein, Urine: NEGATIVE
Urine Glucose: NEGATIVE
Urobilinogen, UA: 0.2 (ref 0.0–1.0)
pH: 5.5 (ref 5.0–8.0)

## 2022-03-26 LAB — TSH: TSH: 2.63 u[IU]/mL (ref 0.35–5.50)

## 2022-03-26 LAB — VITAMIN B12: Vitamin B-12: 421 pg/mL (ref 211–911)

## 2022-03-26 LAB — VITAMIN D 25 HYDROXY (VIT D DEFICIENCY, FRACTURES): VITD: 46.59 ng/mL (ref 30.00–100.00)

## 2022-03-26 LAB — MICROALBUMIN / CREATININE URINE RATIO
Creatinine,U: 133.4 mg/dL
Microalb Creat Ratio: 4.1 mg/g (ref 0.0–30.0)
Microalb, Ur: 5.5 mg/dL — ABNORMAL HIGH (ref 0.0–1.9)

## 2022-03-26 LAB — PSA: PSA: 3.01 ng/mL (ref 0.10–4.00)

## 2022-04-01 ENCOUNTER — Ambulatory Visit: Payer: Medicare PPO | Admitting: Internal Medicine

## 2022-04-01 VITALS — BP 128/70 | HR 72 | Temp 98.2°F | Ht 66.0 in | Wt 271.0 lb

## 2022-04-01 DIAGNOSIS — E1169 Type 2 diabetes mellitus with other specified complication: Secondary | ICD-10-CM

## 2022-04-01 DIAGNOSIS — Z0000 Encounter for general adult medical examination without abnormal findings: Secondary | ICD-10-CM

## 2022-04-01 DIAGNOSIS — E559 Vitamin D deficiency, unspecified: Secondary | ICD-10-CM

## 2022-04-01 DIAGNOSIS — E7800 Pure hypercholesterolemia, unspecified: Secondary | ICD-10-CM | POA: Diagnosis not present

## 2022-04-01 DIAGNOSIS — E538 Deficiency of other specified B group vitamins: Secondary | ICD-10-CM | POA: Diagnosis not present

## 2022-04-01 DIAGNOSIS — E785 Hyperlipidemia, unspecified: Secondary | ICD-10-CM

## 2022-04-01 DIAGNOSIS — Z0001 Encounter for general adult medical examination with abnormal findings: Secondary | ICD-10-CM

## 2022-04-01 DIAGNOSIS — Z1211 Encounter for screening for malignant neoplasm of colon: Secondary | ICD-10-CM | POA: Insufficient documentation

## 2022-04-01 MED ORDER — TRAMADOL HCL 50 MG PO TABS: 50.0000 mg | ORAL_TABLET | Freq: Four times a day (QID) | ORAL | 5 refills | Status: DC | PRN

## 2022-04-01 NOTE — Patient Instructions (Signed)
Please continue all other medications as before, and refills have been done if requested.  Please have the pharmacy call with any other refills you may need.  Please continue your efforts at being more active, low cholesterol diet, and weight control.  You are otherwise up to date with prevention measures today.  Please keep your appointments with your specialists as you may have planned  Please make an Appointment to return in 6 months, or sooner if needed, also with Lab Appointment for testing done 3-5 days before at the FIRST FLOOR Lab (so this is for TWO appointments - please see the scheduling desk as you leave)  
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
## 2022-04-04 ENCOUNTER — Encounter: Payer: Self-pay | Admitting: Internal Medicine

## 2022-04-04 NOTE — Assessment & Plan Note (Signed)
Last vitamin D Lab Results  Component Value Date   VD25OH 46.59 03/26/2022   Stable, cont oral replacement

## 2022-04-04 NOTE — Assessment & Plan Note (Signed)
Lab Results  Component Value Date   LDLCALC 41 03/26/2022   Stable, pt to continue current statin crestor 10 mg qd

## 2022-04-04 NOTE — Assessment & Plan Note (Signed)
Age and sex appropriate education and counseling updated with regular exercise and diet Referrals for preventative services - pt will call for eye exam Immunizations addressed - declines covid booster Smoking counseling  - none needed Evidence for depression or other mood disorder - none significant Most recent labs reviewed. I have personally reviewed and have noted: 1) the patient's medical and social history 2) The patient's current medications and supplements 3) The patient's height, weight, and BMI have been recorded in the chart

## 2022-04-04 NOTE — Assessment & Plan Note (Signed)
Lab Results  Component Value Date   [id number] 437 03/26/2022   Stable, cont oral replacement - b12 1000 mcg qd

## 2022-04-04 NOTE — Assessment & Plan Note (Signed)
Lab Results  Component Value Date   HGBA1C 6.9 (H) 03/26/2022   Mild uncontrolled in the setting of obeisty, pt to continue current medical treatment metfomrin 500 bid and call if he decides to start the ozempic 0.25 mg weekly

## 2022-04-05 DIAGNOSIS — E1169 Type 2 diabetes mellitus with other specified complication: Secondary | ICD-10-CM | POA: Diagnosis not present

## 2022-04-10 ENCOUNTER — Telehealth: Payer: Self-pay

## 2022-04-10 NOTE — Telephone Encounter (Signed)
Left message for patient to call back to schedule Medicare Annual Wellness Visit   Last AWV  12/21/20  Please schedule at anytime with [facility] if patient calls the office back.    30 Minutes appointment   Any questions, please call me at [phone]

## 2022-04-17 ENCOUNTER — Other Ambulatory Visit: Payer: Self-pay | Admitting: Internal Medicine

## 2022-04-17 NOTE — Telephone Encounter (Signed)
Please refill as per office routine med refill policy (all routine meds to be refilled for 3 mo or monthly (per pt preference) up to one year from last visit, then month to month grace period for 3 mo, then further med refills will have to be denied) ? ?

## 2022-04-22 ENCOUNTER — Ambulatory Visit: Payer: Medicare PPO | Attending: Cardiology | Admitting: Cardiology

## 2022-04-22 ENCOUNTER — Encounter: Payer: Self-pay | Admitting: *Deleted

## 2022-04-22 ENCOUNTER — Encounter: Payer: Self-pay | Admitting: Cardiology

## 2022-04-22 VITALS — BP 126/84 | HR 76 | Ht 66.0 in | Wt 269.0 lb

## 2022-04-22 DIAGNOSIS — D6869 Other thrombophilia: Secondary | ICD-10-CM | POA: Diagnosis not present

## 2022-04-22 DIAGNOSIS — I4819 Other persistent atrial fibrillation: Secondary | ICD-10-CM | POA: Diagnosis not present

## 2022-04-22 DIAGNOSIS — Z79899 Other long term (current) drug therapy: Secondary | ICD-10-CM

## 2022-04-22 DIAGNOSIS — G4733 Obstructive sleep apnea (adult) (pediatric): Secondary | ICD-10-CM

## 2022-04-22 DIAGNOSIS — I10 Essential (primary) hypertension: Secondary | ICD-10-CM
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
## 2022-04-22 NOTE — Patient Instructions (Signed)
Medication Instructions:  Your physician recommends that you continue on your current medications as directed. Please refer to the Current Medication list given to you today.  *If you need a refill on your cardiac medications before your next appointment, please call your pharmacy*   Lab Work: None ordered   Testing/Procedures: Your physician has recommended that you have a Cardioversion (DCCV). Electrical Cardioversion uses a jolt of electricity to your heart either through paddles or wired patches attached to your chest. This is a controlled, usually prescheduled, procedure. Defibrillation is done under light anesthesia in the hospital, and you usually go home the day of the procedure. This is done to get your heart back into a normal rhythm. You are not awake for the procedure. Please see the instruction sheet given to you today.   Follow-Up: At [facility], you and your health needs are our priority.  As part of our continuing mission to provide you with exceptional heart care, we have created designated Provider Care Teams.  These Care Teams include your primary Cardiologist (physician) and Advanced Practice Providers (APPs -  Physician Assistants and Nurse Practitioners) who all work together to provide you with the care you need, when you need it.  Your next appointment:   2 month(s)  The format for your next appointment:   In Person  Provider:   Allegra Lai, MD    Thank you for choosing [facility]!!   Trinidad Curet, RN [phone]
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
## 2022-04-27 ENCOUNTER — Other Ambulatory Visit: Payer: Self-pay | Admitting: Internal Medicine

## 2022-04-29 NOTE — Telephone Encounter (Signed)
Okay to continue filling? Patient was due for follow up beginning of February, but not scheduled until beginning of May. Thanks

## 2022-05-08 ENCOUNTER — Ambulatory Visit (HOSPITAL_COMMUNITY): Payer: Medicare PPO | Admitting: Certified Registered Nurse Anesthetist

## 2022-05-08 ENCOUNTER — Encounter (HOSPITAL_COMMUNITY)
Admission: RE | Disposition: A | Discharge status: Home / Self Care | Payer: Self-pay | Source: Home / Self Care | Attending: Cardiology

## 2022-05-08 ENCOUNTER — Ambulatory Visit (HOSPITAL_BASED_OUTPATIENT_CLINIC_OR_DEPARTMENT_OTHER): Payer: Medicare PPO | Admitting: Certified Registered Nurse Anesthetist

## 2022-05-08 ENCOUNTER — Ambulatory Visit (HOSPITAL_COMMUNITY)
Admission: RE | Admit: 2022-05-08 | Discharge: 2022-05-08 | Disposition: A | Discharge status: Home / Self Care | Payer: Medicare PPO | Attending: Cardiology | Admitting: Cardiology

## 2022-05-08 ENCOUNTER — Other Ambulatory Visit: Payer: Self-pay

## 2022-05-08 DIAGNOSIS — I4891 Unspecified atrial fibrillation: Secondary | ICD-10-CM

## 2022-05-08 DIAGNOSIS — Z8249 Family history of ischemic heart disease and other diseases of the circulatory system: Secondary | ICD-10-CM | POA: Insufficient documentation

## 2022-05-08 DIAGNOSIS — Z9989 Dependence on other enabling machines and devices: Secondary | ICD-10-CM | POA: Diagnosis not present

## 2022-05-08 DIAGNOSIS — I4819 Other persistent atrial fibrillation: Secondary | ICD-10-CM | POA: Diagnosis not present

## 2022-05-08 DIAGNOSIS — I471 Supraventricular tachycardia, unspecified: Secondary | ICD-10-CM | POA: Diagnosis not present

## 2022-05-08 DIAGNOSIS — I480 Paroxysmal atrial fibrillation: Secondary | ICD-10-CM

## 2022-05-08 DIAGNOSIS — D6869 Other thrombophilia: Secondary | ICD-10-CM | POA: Insufficient documentation

## 2022-05-08 DIAGNOSIS — I4892 Unspecified atrial flutter: Secondary | ICD-10-CM | POA: Diagnosis not present

## 2022-05-08 DIAGNOSIS — I10 Essential (primary) hypertension: Secondary | ICD-10-CM | POA: Diagnosis not present

## 2022-05-08 DIAGNOSIS — Z7901 Long term (current) use of anticoagulants: Secondary | ICD-10-CM | POA: Insufficient documentation

## 2022-05-08 DIAGNOSIS — E119 Type 2 diabetes mellitus without complications: Secondary | ICD-10-CM | POA: Diagnosis not present

## 2022-05-08 DIAGNOSIS — I119 Hypertensive heart disease without heart failure: Secondary | ICD-10-CM

## 2022-05-08 DIAGNOSIS — Z79899 Other long term (current) drug therapy: Secondary | ICD-10-CM | POA: Insufficient documentation

## 2022-05-08 DIAGNOSIS — G4733 Obstructive sleep apnea (adult) (pediatric): Secondary | ICD-10-CM | POA: Diagnosis not present

## 2022-05-08 DIAGNOSIS — I2510 Atherosclerotic heart disease of native coronary artery without angina pectoris: Secondary | ICD-10-CM | POA: Diagnosis not present

## 2022-05-08 HISTORY — PX: CARDIOVERSION: SHX1299

## 2022-05-08 LAB — POCT I-STAT, CHEM 8
BUN: 17 mg/dL (ref 8–23)
Calcium, Ion: 1.12 mmol/L — ABNORMAL LOW (ref 1.15–1.40)
Chloride: 107 mmol/L (ref 98–111)
Creatinine, Ser: 0.8 mg/dL (ref 0.61–1.24)
Glucose, Bld: 122 mg/dL — ABNORMAL HIGH (ref 70–99)
HCT: 47 % (ref 39.0–52.0)
Hemoglobin: 16 g/dL (ref 13.0–17.0)
Potassium: 4.4 mmol/L (ref 3.5–5.1)
Sodium: 139 mmol/L (ref 135–145)
TCO2: 22 mmol/L (ref 22–32)

## 2022-05-08 SURGERY — CARDIOVERSION
Anesthesia: General

## 2022-05-08 MED ORDER — PROPOFOL 10 MG/ML IV BOLUS
INTRAVENOUS | Status: DC | PRN
  Administered 2022-05-08: 60 mg via INTRAVENOUS

## 2022-05-08 MED ORDER — SODIUM CHLORIDE 0.9 % IV SOLN: INTRAVENOUS | Status: DC

## 2022-05-08 MED ORDER — LIDOCAINE 2% (20 MG/ML) 5 ML SYRINGE
INTRAMUSCULAR | Status: DC | PRN
  Administered 2022-05-08: 40 mg via INTRAVENOUS

## 2022-05-08 NOTE — Progress Notes (Signed)
Pt presented to endoscopy today for DCCV with MD Johney Frame. When getting the pt out of the chair and into the wheelchair discharge, pt c/o pain in his upper left chest as he pushed off. 12 lead ECG obtained and MD Pemberton notified. MD Johney Frame read the 12 lead ECG and approved discharge. Pt discharged to home with d/c instructions.  Debarah Crape, RN 05/08/22 10:36 AM

## 2022-05-08 NOTE — Interval H&P Note (Signed)
History and Physical Interval Note:  05/08/2022 9:39 AM  Eric Santos  has presented today for surgery, with the diagnosis of AFIB.  The various methods of treatment have been discussed with the patient and family. After consideration of risks, benefits and other options for treatment, the patient has consented to  Procedure(s): CARDIOVERSION (N/A) as a surgical intervention.  The patient's history has been reviewed, patient examined, no change in status, stable for surgery.  I have reviewed the patient's chart and labs.  Questions were answered to the patient's satisfaction.     Freada Bergeron

## 2022-05-08 NOTE — Anesthesia Preprocedure Evaluation (Addendum)
Anesthesia Evaluation  Patient identified by MRN, date of birth, ID band Patient awake    Reviewed: Allergy & Precautions, NPO status , Patient's Chart, lab work & pertinent test results  Airway Mallampati: III  TM Distance: >3 FB Neck ROM: Full    Dental  (+) Teeth Intact, Dental Advisory Given   Pulmonary sleep apnea and Continuous Positive Airway Pressure Ventilation    breath sounds clear to auscultation       Cardiovascular hypertension, Pt. on medications and Pt. on home beta blockers + CAD (non-obstructive)  + dysrhythmias Atrial Fibrillation  Rhythm:Irregular Rate:Normal  Echo: 1. Left ventricular ejection fraction, by visual estimation, is 50 to  55%. The left ventricle has low normal function. There is mildly increased  left ventricular hypertrophy.   2. Left ventricular diastolic function could not be evaluated.   3. The left ventricle has no regional wall motion abnormalities.   4. Global right ventricle has normal systolic function.The right  ventricular size is normal. No increase in right ventricular wall  thickness.   5. Left atrial size was mildly dilated.   6. Right atrial size was normal.   7. The mitral valve was not well visualized. Trivial mitral valve  regurgitation.   8. The tricuspid valve is not well visualized.   9. The aortic valve was not well visualized. Aortic valve regurgitation  is trivial. Mild aortic valve sclerosis without stenosis.  10. The pulmonic valve was not well visualized. Pulmonic valve  regurgitation is not visualized.  11. The inferior vena cava is normal in size with <50% respiratory  variability, suggesting right atrial pressure of 8 mmHg.  12. The interatrial septum was not assessed.  13. Suboptimal windows, no obvious large valvular vegetations. Consider  TEE if pretest probability for endocarditis is high.     Neuro/Psych  negative psych ROS   GI/Hepatic hiatal  hernia,GERD  ,,  Endo/Other  diabetes (diet controlled)    Renal/GU Renal InsufficiencyRenal disease     Musculoskeletal  (+) Arthritis ,    Abdominal   Peds  Hematology eliquis   Anesthesia Other Findings   Reproductive/Obstetrics                             Anesthesia Physical Anesthesia Plan  ASA: 3  Anesthesia Plan: General   Post-op Pain Management: Minimal or no pain anticipated   Induction: Intravenous  PONV Risk Score and Plan: 2 and Treatment may vary due to age or medical condition  Airway Management Planned: Natural Airway and Mask  Additional Equipment: None  Intra-op Plan:   Post-operative Plan:   Informed Consent: I have reviewed the patients History and Physical, chart, labs and discussed the procedure including the risks, benefits and alternatives for the proposed anesthesia with the patient or authorized representative who has indicated his/her understanding and acceptance.       Plan Discussed with: CRNA  Anesthesia Plan Comments:         Anesthesia Quick Evaluation

## 2022-05-08 NOTE — Anesthesia Procedure Notes (Signed)
Procedure Name: General with mask airway Date/Time: 05/08/2022 9:46 AM  Performed by: Carolan Clines, CRNAPre-anesthesia Checklist: Patient identified, Emergency Drugs available, Suction available and Patient being monitored Patient Re-evaluated:Patient Re-evaluated prior to induction Oxygen Delivery Method: Ambu bag Preoxygenation: Pre-oxygenation with 100% oxygen Induction Type: IV induction Dental Injury: Teeth and Oropharynx as per pre-operative assessment

## 2022-05-08 NOTE — Discharge Instructions (Signed)

## 2022-05-08 NOTE — CV Procedure (Signed)
Procedure: Electrical Cardioversion Indications:  Atrial Fibrillation  Procedure Details:  Consent: Risks of procedure as well as the alternatives and risks of each were explained to the (patient/caregiver).  Consent for procedure obtained.  Time Out: Verified patient identification, verified procedure, site/side was marked, verified correct patient position, special equipment/implants available, medications/allergies/relevent history reviewed, required imaging and test results available. PERFORMED.  Patient placed on cardiac monitor, pulse oximetry, supplemental oxygen as necessary.  Sedation given:  Propofol '60mg'$ ; lidocaine '40mg'$  daily Pacer pads placed anterior and posterior chest.  Cardioverted 1 time(s).  Cardioversion with synchronized biphasic 200J shock.  Evaluation: Findings: Post procedure EKG shows: NSR Complications: None Patient did tolerate procedure well.  Time Spent Directly with the Patient:  27mnutes   HFreada Bergeron3/08/2022, 10:01 AM

## 2022-05-08 NOTE — Transfer of Care (Signed)
Immediate Anesthesia Transfer of Care Note  Patient: Eric Santos  Procedure(s) Performed: CARDIOVERSION  Patient Location: Endoscopy Unit  Anesthesia Type:General  Level of Consciousness: drowsy  Airway & Oxygen Therapy: Patient Spontanous Breathing  Post-op Assessment: Report given to RN and Post -op Vital signs reviewed and stable  Post vital signs: Reviewed and stable  Last Vitals:  Vitals Value Taken Time  BP 126/83   Temp    Pulse 76   Resp 19   SpO2 95     Last Pain:  Vitals:   05/08/22 0910  TempSrc: Temporal  PainSc: 0-No pain         Complications: No notable events documented.

## 2022-05-08 NOTE — Anesthesia Postprocedure Evaluation (Signed)
Anesthesia Post Note  Patient: Eric Santos  Procedure(s) Performed: CARDIOVERSION     Patient location during evaluation: PACU Anesthesia Type: General Level of consciousness: awake and alert Pain management: pain level controlled Vital Signs Assessment: post-procedure vital signs reviewed and stable Respiratory status: spontaneous breathing, nonlabored ventilation, respiratory function stable and patient connected to nasal cannula oxygen Cardiovascular status: blood pressure returned to baseline and stable Postop Assessment: no apparent nausea or vomiting Anesthetic complications: no  No notable events documented.  Last Vitals:  Vitals:   05/08/22 1000 05/08/22 1010  BP: 109/88 (!) 123/103  Pulse: 79 76  Resp: 14 10  Temp:    SpO2: 94% 97%    Last Pain:  Vitals:   05/08/22 1010  TempSrc:   PainSc: 0-No pain                 [name]

## 2022-05-12 ENCOUNTER — Encounter (HOSPITAL_COMMUNITY): Payer: Self-pay | Admitting: Cardiology

## 2022-05-29 ENCOUNTER — Telehealth: Payer: Self-pay | Admitting: Cardiology

## 2022-05-29 NOTE — Telephone Encounter (Signed)
Patient c/o Palpitations:  High priority if patient c/o lightheadedness, shortness of breath, or chest pain  How long have you had palpitations/irregular HR/ Afib? Are you having the symptoms now?  Patient states he has been in afib ever since 3/11 No symptoms currently, but he gets SOB/palpitations with exertion   Are you currently experiencing lightheadedness, SOB or CP?  No   Do you have a history of afib (atrial fibrillation) or irregular heart rhythm?  Yes   Have you checked your BP or HR? (document readings if available):  BP/HR are good per patient  3/26: 108/70 65          110/76 75  3/27: 126/80  1 week ago: 112/74 67  Are you experiencing any other symptoms?  SOB and palpitations with exertion

## 2022-05-29 NOTE — Telephone Encounter (Signed)
Pt aware forwarding to scheduler to arrange OV w/ EP or AFib clinic, who ever has opening. Aware need to further discuss treatment plan since he only stayed in [facility] for about 5 days post DCCV. Pt agreeable to plan and will await scheduler's call.    (Pt aware he will need to come to [facility] for appt and pt agreeable)
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
## 2022-05-31 ENCOUNTER — Encounter: Payer: Self-pay | Admitting: Physician Assistant

## 2022-05-31 ENCOUNTER — Ambulatory Visit: Payer: Medicare PPO | Attending: Physician Assistant | Admitting: Physician Assistant

## 2022-05-31 VITALS — BP 120/80 | HR 72 | Ht 66.0 in | Wt 266.2 lb

## 2022-05-31 DIAGNOSIS — I483 Typical atrial flutter: Secondary | ICD-10-CM

## 2022-05-31 DIAGNOSIS — Z79899 Other long term (current) drug therapy: Secondary | ICD-10-CM | POA: Diagnosis not present

## 2022-05-31 DIAGNOSIS — D6869 Other thrombophilia: Secondary | ICD-10-CM

## 2022-05-31 DIAGNOSIS — I10 Essential (primary) hypertension: Secondary | ICD-10-CM | POA: Diagnosis not present

## 2022-05-31 DIAGNOSIS — Z5181 Encounter for therapeutic drug level monitoring: Secondary | ICD-10-CM

## 2022-05-31 DIAGNOSIS — I4819 Other persistent atrial fibrillation: Secondary | ICD-10-CM | POA: Diagnosis not present

## 2022-05-31 MED ORDER — FLECAINIDE ACETATE 50 MG PO TABS: 75.0000 mg | ORAL_TABLET | Freq: Two times a day (BID) | ORAL | 2 refills | Status: DC

## 2022-05-31 NOTE — Patient Instructions (Addendum)
Medication Instructions:   START TAKING:  FLECAINIDE 75 MG TWICE  A DAY   *If you need a refill on your cardiac medications before your next appointment, please call your pharmacy*   Lab Work: NONE ORDERED  TODAY    If you have labs (blood work) drawn today and your tests are completely normal, you will receive your results only by: [place] (if you have MyChart) OR A paper copy in the mail If you have any lab test that is abnormal or we need to change your treatment, we will call you to review the results.   Testing/Procedures: NONE ORDERED  TODAY    Follow-Up: At [facility], you and your health needs are our priority.  As part of our continuing mission to provide you with exceptional heart care, we have created designated Provider Care Teams.  These Care Teams include your primary Cardiologist (physician) and Advanced Practice Providers (APPs -  Physician Assistants and Nurse Practitioners) who all work together to provide you with the care you need, when you need it.  We recommend signing up for the patient portal called "MyChart".  Sign up information is provided on this After Visit Summary.  MyChart is used to connect with patients for Virtual Visits (Telemedicine).  Patients are able to view lab/test results, encounter notes, upcoming appointments, etc.  Non-urgent messages can be sent to your provider as well.   To learn more about what you can do with MyChart, go to [email or web address].    Your next appointment:  NURSE VISIT WITH EKG IN 7 -10 DAYS ( [place])  PREFERRED  CAN NOT DO IT ON THE 15TH   KEEP AS SCHEDULED   Provider:   You may see Will [name], MD    Other Instructions

## 2022-06-10 ENCOUNTER — Ambulatory Visit: Payer: Medicare PPO | Attending: Cardiology

## 2022-06-10 VITALS — BP 114/62 | HR 58 | Ht 66.0 in | Wt 267.4 lb

## 2022-06-10 DIAGNOSIS — I483 Typical atrial flutter: Secondary | ICD-10-CM

## 2022-06-10 DIAGNOSIS — I4819 Other persistent atrial fibrillation: Secondary | ICD-10-CM

## 2022-06-10 DIAGNOSIS — Z5181 Encounter for therapeutic drug level monitoring: Secondary | ICD-10-CM

## 2022-06-10 NOTE — Progress Notes (Addendum)
   Nurse Visit   Date of Encounter: 06/10/2022 ID: Eric Santos, DOB 1946/02/18, MRN [id number]  PCP:  Corwin Levins, MD   [place]  [facility] Providers Cardiologist:  Charlton Haws, MD Electrophysiologist:  Regan Lemming, MD  Sleep Medicine:  Armanda Magic, MD      Visit Details   VS:  BP 114/62   Pulse (!) 58   Ht 5\' 6"  (1.676 m)   Wt 267 lb 6.4 oz (121.3 kg)   SpO2 95%   BMI 43.16 kg/m  , BMI Body mass index is 43.16 kg/m.  Wt Readings from Last 3 Encounters:  06/10/22 267 lb 6.4 oz (121.3 kg)  05/31/22 266 lb 3.2 oz (120.7 kg)  05/08/22 265 lb (120.2 kg)     Reason for visit: Perform EKG Performed today: Vitals, EKG Provider consulted and Education Changes (medications, testing, etc.) : No new orders at this time. A message was sent to Francis Dowse stating that the EKG was completed and we were going to release the patient. Length of Visit: 20 minutes    Medications Adjustments/Labs and Tests Ordered: Orders Placed This Encounter  Procedures   EKG 12-Lead   No orders of the defined types were placed in this encounter.    Signed, Samson Frederic, RN  06/10/2022 1:46 PM   Narrow QRS, remains in AFib. Rate controlled Will ask my MA to reach out  We can set him up for DCCV and follow up with Dr. Elberta Fortis as scheduled afterwards OR Continue meds, see Dr. Elberta Fortis for next steps/management options, alternative AAD.  Francis Dowse, PA-C 06/14/22

## 2022-06-13 ENCOUNTER — Other Ambulatory Visit: Payer: Self-pay | Admitting: Cardiovascular Disease

## 2022-06-13 DIAGNOSIS — I480 Paroxysmal atrial fibrillation: Secondary | ICD-10-CM

## 2022-06-13 NOTE — Telephone Encounter (Signed)
Eliquis 5mg  refill request received. Patient is 77 years old, weight-121.3kg, Crea-0.80 on 05/08/22, Diagnosis-Afib, and last seen by Francis Dowse on 05/31/22. Dose is appropriate based on dosing criteria. Will send in refill to requested pharmacy.

## 2022-06-19 DIAGNOSIS — G4733 Obstructive sleep apnea (adult) (pediatric): Secondary | ICD-10-CM | POA: Diagnosis not present

## 2022-06-21 NOTE — Telephone Encounter (Signed)
-----   Message from [facility] [place] sent at 06/14/2022  8:03 AM EDT ----- Please reach out to the patient Remained in Afib at his follow up EKG, rate is controlled.  We can pursue DCCV if he would like and follow up with Dr. Elberta Fortis at his scheduled visit OR Continue meds as is and see Dr. Elberta Fortis  as scheduled to revisit his rhythm and management strategies, alternative medication options.  Both are equally reasonable options, though available DCCV dates may be after his visit anyway  Renee

## 2022-06-21 NOTE — Telephone Encounter (Signed)
Spoke with patient about Nurse visit with EKG and aware of results and recommendations.Patient would perfer to talk to

## 2022-06-29 ENCOUNTER — Other Ambulatory Visit: Payer: Self-pay | Admitting: Cardiology

## 2022-07-01 ENCOUNTER — Ambulatory Visit: Payer: Medicare PPO | Attending: Cardiology | Admitting: Cardiology

## 2022-07-01 ENCOUNTER — Encounter: Payer: Self-pay | Admitting: Cardiology

## 2022-07-01 VITALS — BP 110/68 | HR 83 | Ht 66.0 in | Wt 265.0 lb

## 2022-07-01 DIAGNOSIS — Z79899 Other long term (current) drug therapy: Secondary | ICD-10-CM | POA: Diagnosis not present

## 2022-07-01 DIAGNOSIS — I4819 Other persistent atrial fibrillation: Secondary | ICD-10-CM

## 2022-07-01 DIAGNOSIS — I2510 Atherosclerotic heart disease of native coronary artery without angina pectoris: Secondary | ICD-10-CM

## 2022-07-01 DIAGNOSIS — D6869 Other thrombophilia: Secondary | ICD-10-CM

## 2022-07-01 DIAGNOSIS — G4733 Obstructive sleep apnea (adult) (pediatric): Secondary | ICD-10-CM

## 2022-07-01 MED ORDER — FLECAINIDE ACETATE 100 MG PO TABS: 100.0000 mg | ORAL_TABLET | Freq: Two times a day (BID) | ORAL | 3 refills | Status: DC

## 2022-07-01 MED ORDER — DILTIAZEM HCL ER COATED BEADS 360 MG PO CP24: 360.0000 mg | ORAL_CAPSULE | Freq: Every day | ORAL | 3 refills | Status: DC
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
## 2022-07-01 NOTE — Patient Instructions (Addendum)
Medication Instructions:  Your physician has recommended you make the following change in your medication:  INCREASE Flecainide to 100 mg twice daily  *If you need a refill on your cardiac medications before your next appointment, please call your pharmacy*   Lab Work: None ordered If you have labs (blood work) drawn today and your tests are completely normal, you will receive your results only by: MyChart Message (if you have MyChart) OR A paper copy in the mail If you have any lab test that is abnormal or we need to change your treatment, we will call you to review the results.   Testing/Procedures: Your physician has recommended that you have a Cardioversion (DCCV). Electrical Cardioversion uses a jolt of electricity to your heart either through paddles or wired patches attached to your chest. This is a controlled, usually prescheduled, procedure. Defibrillation is done under light anesthesia in the hospital, and you usually go home the day of the procedure. This is done to get your heart back into a normal rhythm. You are not awake for the procedure. Please see the instructions below located under other instructions.    Follow-Up: At [facility], you and your health needs are our priority.  As part of our continuing mission to provide you with exceptional heart care, we have created designated Provider Care Teams.  These Care Teams include your primary Cardiologist (physician) and Advanced Practice Providers (APPs -  Physician Assistants and Nurse Practitioners) who all work together to provide you with the care you need, when you need it.  Your physician recommends that you schedule a follow-up appointment in: first week of June with Wallis Bamberg, NP  Your next appointment:   3 month(s)  The format for your next appointment:   In Person  Provider:   Loman Brooklyn, MD    Thank you for choosing [facility]!!   Dory Horn, RN [phone]  Other Instructions       Dear Eric Santos  You are scheduled for a Cardioversion on Monday, June 10 with Dr. Mayford Knife.  Please arrive at the [facility] (Main Entrance A) at [facility]: [address] [place], [place] [address] at 8:30 AM (This time is 1.5 hour(s) before your procedure to ensure your preparation). Free valet parking service is available. You will check in at ADMITTING. The support person will be asked to wait in the waiting room.  It is OK to have someone drop you off and come back when you are ready to be discharged.      DIET:  Nothing to eat or drink after midnight except a sip of water with medications (see medication instructions below)  MEDICATION INSTRUCTIONS: !!IF ANY NEW MEDICATIONS ARE STARTED AFTER TODAY, PLEASE NOTIFY YOUR PROVIDER AS SOON AS POSSIBLE!!  FYI: Medications such as Semaglutide (Ozempic, [place]), Tirzepatide (Mounjaro, Zepbound), Dulaglutide (Trulicity), etc ("GLP1 agonists") must be held around the time of a procedure. Talk to your provider if you take one of these.  The morning of this procedure take only:  Eliquis, Flecainide, Metoprolol & Diltiazem with sip of water   Continue taking your anticoagulant (blood thinner): Apixaban (Eliquis).  You will need to continue this after your procedure until you are told by your provider that it is safe to stop.    LABS:  Will be completed the morning of this procedure at the hospital   FYI:  For your safety, and to allow [place] to monitor your vital signs accurately during the surgery/procedure we  request: If you have artificial nails, gel coating, SNS etc, please have those removed prior to your surgery/procedure. Not having the nail coverings /polish removed may result in cancellation or delay of your surgery/procedure.  You must have a responsible person to drive you home and stay in the waiting area during your procedure. Failure to do so could result in cancellation.  Bring your insurance cards.  *Special Note:  Every effort is made to have your procedure done on time. Occasionally there are emergencies that occur at the hospital that may cause delays. Please be patient if a delay does occur.

## 2022-07-09 ENCOUNTER — Ambulatory Visit: Payer: Medicare PPO | Admitting: Internal Medicine

## 2022-07-09 ENCOUNTER — Other Ambulatory Visit: Payer: Self-pay

## 2022-07-09 ENCOUNTER — Encounter: Payer: Self-pay | Admitting: Internal Medicine

## 2022-07-09 VITALS — BP 108/73 | HR 62 | Temp 97.1°F | Ht 66.0 in | Wt 263.0 lb

## 2022-07-09 DIAGNOSIS — Z5181 Encounter for therapeutic drug level monitoring: Secondary | ICD-10-CM | POA: Diagnosis not present

## 2022-07-09 DIAGNOSIS — E1169 Type 2 diabetes mellitus with other specified complication: Secondary | ICD-10-CM | POA: Diagnosis not present

## 2022-07-09 DIAGNOSIS — E1165 Type 2 diabetes mellitus with hyperglycemia: Secondary | ICD-10-CM | POA: Diagnosis not present

## 2022-07-09 DIAGNOSIS — M00061 Staphylococcal arthritis, right knee: Secondary | ICD-10-CM

## 2022-07-09 MED ORDER — CEPHALEXIN 500 MG PO CAPS: 1000.0000 mg | ORAL_CAPSULE | Freq: Two times a day (BID) | ORAL | 3 refills | Status: DC

## 2022-07-09 MED ORDER — SULFAMETHOXAZOLE-TRIMETHOPRIM 800-160 MG PO TABS: 1.0000 | ORAL_TABLET | Freq: Every day | ORAL | 3 refills | Status: DC

## 2022-07-09 NOTE — Assessment & Plan Note (Signed)
Will check a bmp °

## 2022-07-09 NOTE — Assessment & Plan Note (Signed)
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  No changes and he can rtc in 6 months since he is getting labs from Dr. Jonny Ruiz in between.

## 2022-07-09 NOTE — Progress Notes (Signed)
   Subjective:    Patient ID: Eric Santos, male    DOB: [birth year], 77 y.o.   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 Turner Daniels in November 2020 due to infection and culture with CoNS, oxacillin sensitive, clinda, tetracycline and TMP/SMX resistant.  He was on daptomycin for nearly 6 weeks but due to side effects was stopped a bit early.  He was then placed on Keflex after that, initially 4 times a day then reduced to 1 gram twice a day.  He then later last year developed more leg swelling and noted instability of his prosthetic joint and sent to Dr. Haskell Riling in [place] who performed a revision of the total knee on 01/05/20.      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  He was then changed to Bactrim and has been on that since that time and tolerating well.    He continues on Bactrim and cephalexin and having no issus.  No complaints, no associated rash or diarrhea.  No missed doses or concerns.  Last BMP by Dr. Jonny Ruiz with mildly elevated potassium and is aware.     Review of Systems  Constitutional:  Negative for fever.  Gastrointestinal:  Negative for diarrhea.  Skin:  Negative for rash.       Objective:   Physical Exam Eyes:     General: No scleral icterus. Pulmonary:     Effort: Pulmonary effort is normal.  Musculoskeletal:        General: Normal range of motion.  Neurological:     Mental Status: He is alert.  Psychiatric:        Mood and Affect: Mood normal.         Assessment & Plan:

## 2022-07-10 LAB — BASIC METABOLIC PANEL
BUN: 21 mg/dL (ref 7–25)
CO2: 23 mmol/L (ref 20–32)
Calcium: 9.5 mg/dL (ref 8.6–10.3)
Chloride: 105 mmol/L (ref 98–110)
Creat: 0.93 mg/dL (ref 0.70–1.28)
Glucose, Bld: 103 mg/dL — ABNORMAL HIGH (ref 65–99)
Potassium: 4.5 mmol/L (ref 3.5–5.3)
Sodium: 140 mmol/L (ref 135–146)

## 2022-07-13 DIAGNOSIS — L814 Other melanin hyperpigmentation: Secondary | ICD-10-CM | POA: Diagnosis not present

## 2022-07-13 DIAGNOSIS — L918 Other hypertrophic disorders of the skin: Secondary | ICD-10-CM | POA: Diagnosis not present

## 2022-07-13 DIAGNOSIS — L821 Other seborrheic keratosis: Secondary | ICD-10-CM | POA: Diagnosis not present

## 2022-07-13 DIAGNOSIS — L570 Actinic keratosis: Secondary | ICD-10-CM | POA: Diagnosis not present

## 2022-07-22 ENCOUNTER — Other Ambulatory Visit: Payer: Self-pay | Admitting: Cardiovascular Disease

## 2022-08-04 NOTE — Progress Notes (Unsigned)
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 health needs are our priority.  As part of our continuing mission to provide you with exceptional heart care, we have created designated Provider Care Teams.  These Care Teams include your primary Cardiologist (physician) and Advanced Practice Providers (APPs -  Physician Assistants and Nurse Practitioners) who all work together to provide you with the care you need, when you need it.  We recommend signing up for the patient portal called "MyChart".  Sign up information is provided on this After Visit Summary.  MyChart is used to connect with patients for Virtual Visits (Telemedicine).  Patients are able to view lab/test results, encounter notes, upcoming appointments, etc.  Non-urgent messages can be sent to your provider as well.   To learn more about what you can do with MyChart, go to [email or web address].    Your  next appointment:   As scheduled with Dr. Elberta Fortis  The format for your next appointment:   In Person  Provider:   [facility]     Other Instructions none  Important Information About Sugar        Signed, Flossie Dibble, NP  08/05/2022 11:27 AM    [facility]

## 2022-08-05 ENCOUNTER — Encounter: Payer: Self-pay | Admitting: Cardiology

## 2022-08-05 ENCOUNTER — Ambulatory Visit: Payer: Medicare PPO | Attending: Cardiology | Admitting: Cardiology

## 2022-08-05 VITALS — BP 100/60 | HR 56 | Ht 66.0 in | Wt 260.0 lb

## 2022-08-05 DIAGNOSIS — D6869 Other thrombophilia: Secondary | ICD-10-CM

## 2022-08-05 DIAGNOSIS — I4892 Unspecified atrial flutter: Secondary | ICD-10-CM | POA: Diagnosis not present

## 2022-08-05 DIAGNOSIS — D6859 Other primary thrombophilia: Secondary | ICD-10-CM | POA: Diagnosis not present

## 2022-08-05 DIAGNOSIS — Z79899 Other long term (current) drug therapy: Secondary | ICD-10-CM | POA: Diagnosis not present

## 2022-08-05 DIAGNOSIS — I2510 Atherosclerotic heart disease of native coronary artery without angina pectoris: Secondary | ICD-10-CM | POA: Diagnosis not present

## 2022-08-05 DIAGNOSIS — I483 Typical atrial flutter: Secondary | ICD-10-CM | POA: Diagnosis not present

## 2022-08-05 DIAGNOSIS — E782 Mixed hyperlipidemia: Secondary | ICD-10-CM

## 2022-08-05 DIAGNOSIS — I4819 Other persistent atrial fibrillation: Secondary | ICD-10-CM

## 2022-08-05 NOTE — Patient Instructions (Signed)
Medication Instructions:  Your physician recommends that you continue on your current medications as directed. Please refer to the Current Medication list given to you today.  *If you need a refill on your cardiac medications before your next appointment, please call your pharmacy*   Lab Work: None ordered If you have labs (blood work) drawn today and your tests are completely normal, you will receive your results only by: MyChart Message (if you have MyChart) OR A paper copy in the mail If you have any lab test that is abnormal or we need to change your treatment, we will call you to review the results.   Testing/Procedures: None ordered   Follow-Up: At [facility], you and your health needs are our priority.  As part of our continuing mission to provide you with exceptional heart care, we have created designated Provider Care Teams.  These Care Teams include your primary Cardiologist (physician) and Advanced Practice Providers (APPs -  Physician Assistants and Nurse Practitioners) who all work together to provide you with the care you need, when you need it.  We recommend signing up for the patient portal called "MyChart".  Sign up information is provided on this After Visit Summary.  MyChart is used to connect with patients for Virtual Visits (Telemedicine).  Patients are able to view lab/test results, encounter notes, upcoming appointments, etc.  Non-urgent messages can be sent to your provider as well.   To learn more about what you can do with MyChart, go to [email or web address].    Your next appointment:   As scheduled with Dr. Elberta Fortis  The format for your next appointment:   In Person  Provider:   [facility]     Other Instructions none  Important Information About Sugar

## 2022-08-09 NOTE — Progress Notes (Signed)
Spoke with patient, Procedure scheduled for 1000, Please arrive at the hospital at 0900, NPO after midnight on Sunday, May take meds with sips of water in the AM, please have transportation for home post procedure, and someone to stay with pt for approximately 24 hours after  Pt states he hasn't missed any doses of his Eliquis

## 2022-08-12 ENCOUNTER — Ambulatory Visit (HOSPITAL_COMMUNITY): Admission: RE | Admit: 2022-08-12 | Payer: Medicare PPO | Source: Ambulatory Visit | Admitting: Cardiology

## 2022-08-12 ENCOUNTER — Encounter (HOSPITAL_COMMUNITY): Admission: RE | Payer: Self-pay | Source: Ambulatory Visit

## 2022-08-12 ENCOUNTER — Telehealth: Payer: Self-pay | Admitting: Physician Assistant

## 2022-08-12 DIAGNOSIS — I4819 Other persistent atrial fibrillation: Secondary | ICD-10-CM

## 2022-08-12 DIAGNOSIS — I480 Paroxysmal atrial fibrillation: Secondary | ICD-10-CM

## 2022-08-12 SURGERY — CARDIOVERSION
Anesthesia: Monitor Anesthesia Care

## 2022-08-12 NOTE — Telephone Encounter (Signed)
Scheduled for TEE/DCCV this morning. Called stating he had a bad coughing spell Friday and his heart converted to regular rhythm, confirmed on kardia device. He is feeling well and would like to cancel cardioversion today.   Can you please get him in for an EKG / nurse visit today to confirm?   Marcelino Duster, PA-C 08/12/2022, 7:15 AM [phone] [facility] [address] [place], [place] [address]

## 2022-08-12 NOTE — Telephone Encounter (Signed)
Pt reports in NSR per his Kardia mobile. Advised to call back to office if goes back out of rhythm before his scheduled follow up in August. Patient verbalized understanding and agreeable to plan.  MD agreeable to plan as well.

## 2022-08-16 ENCOUNTER — Telehealth: Payer: Self-pay | Admitting: Internal Medicine

## 2022-08-16 DIAGNOSIS — E1169 Type 2 diabetes mellitus with other specified complication: Secondary | ICD-10-CM

## 2022-08-16 DIAGNOSIS — E559 Vitamin D deficiency, unspecified: Secondary | ICD-10-CM

## 2022-08-16 DIAGNOSIS — E538 Deficiency of other specified B group vitamins: Secondary | ICD-10-CM

## 2022-08-16 DIAGNOSIS — Z125 Encounter for screening for malignant neoplasm of prostate: Secondary | ICD-10-CM

## 2022-08-16 NOTE — Telephone Encounter (Signed)
Pt has PCP visit 7/29 and lab visit 7/22.  Please enter lab orders.

## 2022-08-16 NOTE — Telephone Encounter (Addendum)
Labs ordered thank s 

## 2022-09-02 ENCOUNTER — Other Ambulatory Visit: Payer: Self-pay

## 2022-09-02 MED ORDER — TRAZODONE HCL 50 MG PO TABS: 25.0000 mg | ORAL_TABLET | Freq: Every evening | ORAL | 1 refills | Status: DC | PRN

## 2022-09-20 ENCOUNTER — Other Ambulatory Visit: Payer: Self-pay

## 2022-09-20 ENCOUNTER — Telehealth: Payer: Self-pay | Admitting: Cardiology

## 2022-09-20 NOTE — Telephone Encounter (Signed)
Called patient's wife Eulah Citizen and informed her of Dr. Vanetta Shawl recommendation below:  "D/c metoprolol"  Patient's wife verbalized understanding and had no further questions at this time.

## 2022-09-20 NOTE — Telephone Encounter (Signed)
Pt c/o medication issue:  1. Name of Medication:   flecainide (TAMBOCOR) 50 MG tablet    2. How are you currently taking this medication (dosage and times per day)?   Take 100 mg by mouth 2 (two) times daily.    3. Are you having a reaction (difficulty breathing--STAT)? No  4. What is your medication issue? Pt's wife thinks the above medication might be causing the pt to have a low heart rate in the 40's . Please advise.

## 2022-09-20 NOTE — Telephone Encounter (Signed)
Called patient's wife Eulah Citizen and she reported that the patient's heart rate has been low:  7/19 - HR 52 7/18 - HR 50 in pm and 56 in am 7/17 - HR 49 in pm and 53 in am  Patient is currently taking Flecanide 100 mg BID. Patient uses the mobile [place] and it says that he is in SR. Patient's wife believes the medication is making the patient's heart rate low. Please advise.

## 2022-09-23 ENCOUNTER — Other Ambulatory Visit (INDEPENDENT_AMBULATORY_CARE_PROVIDER_SITE_OTHER): Payer: Medicare PPO

## 2022-09-23 DIAGNOSIS — E559 Vitamin D deficiency, unspecified: Secondary | ICD-10-CM | POA: Diagnosis not present

## 2022-09-23 DIAGNOSIS — E785 Hyperlipidemia, unspecified: Secondary | ICD-10-CM

## 2022-09-23 DIAGNOSIS — E1169 Type 2 diabetes mellitus with other specified complication: Secondary | ICD-10-CM | POA: Diagnosis not present

## 2022-09-23 DIAGNOSIS — E538 Deficiency of other specified B group vitamins: Secondary | ICD-10-CM

## 2022-09-23 DIAGNOSIS — Z125 Encounter for screening for malignant neoplasm of prostate: Secondary | ICD-10-CM | POA: Diagnosis not present

## 2022-09-23 LAB — URINALYSIS, ROUTINE W REFLEX MICROSCOPIC
Bilirubin Urine: NEGATIVE
Hgb urine dipstick: NEGATIVE
Ketones, ur: NEGATIVE
Leukocytes,Ua: NEGATIVE
Nitrite: NEGATIVE
RBC / HPF: NONE SEEN (ref 0–?)
Specific Gravity, Urine: 1.025 (ref 1.000–1.030)
Total Protein, Urine: NEGATIVE
Urine Glucose: NEGATIVE
Urobilinogen, UA: 0.2 (ref 0.0–1.0)
pH: 6 (ref 5.0–8.0)

## 2022-09-23 LAB — LIPID PANEL
Cholesterol: 99 mg/dL (ref 0–200)
HDL: 33.1 mg/dL — ABNORMAL LOW (ref >39.00)
LDL Cholesterol: 42 mg/dL (ref 0–99)
NonHDL: 65.7
Total CHOL/HDL Ratio: 3
Triglycerides: 118 mg/dL (ref 0.0–149.0)
VLDL: 23.6 mg/dL (ref 0.0–40.0)

## 2022-09-23 LAB — TSH: TSH: 2.06 u[IU]/mL (ref 0.35–5.50)

## 2022-09-23 LAB — BASIC METABOLIC PANEL
BUN: 15 mg/dL (ref 6–23)
CO2: 27 mEq/L (ref 19–32)
Calcium: 9.5 mg/dL (ref 8.4–10.5)
Chloride: 102 mEq/L (ref 96–112)
Creatinine, Ser: 0.85 mg/dL (ref 0.40–1.50)
GFR: 84.86 mL/min (ref >60.00)
Glucose, Bld: 101 mg/dL — ABNORMAL HIGH (ref 70–99)
Potassium: 4.9 mEq/L (ref 3.5–5.1)
Sodium: 140 mEq/L (ref 135–145)

## 2022-09-23 LAB — CBC WITH DIFFERENTIAL/PLATELET
Basophils Absolute: 0 10*3/uL (ref 0.0–0.1)
Basophils Relative: 0.5 % (ref 0.0–3.0)
Eosinophils Absolute: 0.2 10*3/uL (ref 0.0–0.7)
Eosinophils Relative: 2 % (ref 0.0–5.0)
HCT: 45.2 % (ref 39.0–52.0)
Hemoglobin: 14.4 g/dL (ref 13.0–17.0)
Lymphocytes Relative: 23.5 % (ref 12.0–46.0)
Lymphs Abs: 1.8 10*3/uL (ref 0.7–4.0)
MCHC: 31.8 g/dL (ref 30.0–36.0)
MCV: 88.2 fl (ref 78.0–100.0)
Monocytes Absolute: 0.8 10*3/uL (ref 0.1–1.0)
Monocytes Relative: 10.4 % (ref 3.0–12.0)
Neutro Abs: 4.8 10*3/uL (ref 1.4–7.7)
Neutrophils Relative %: 63.6 % (ref 43.0–77.0)
Platelets: 288 10*3/uL (ref 150.0–400.0)
RBC: 5.13 Mil/uL (ref 4.22–5.81)
RDW: 14.9 % (ref 11.5–15.5)
WBC: 7.5 10*3/uL (ref 4.0–10.5)

## 2022-09-23 LAB — VITAMIN B12: Vitamin B-12: 406 pg/mL (ref 211–911)

## 2022-09-23 LAB — VITAMIN D 25 HYDROXY (VIT D DEFICIENCY, FRACTURES): VITD: 55.1 ng/mL (ref 30.00–100.00)

## 2022-09-23 LAB — HEPATIC FUNCTION PANEL
ALT: 13 U/L (ref 0–53)
AST: 15 U/L (ref 0–37)
Albumin: 4.2 g/dL (ref 3.5–5.2)
Alkaline Phosphatase: 68 U/L (ref 39–117)
Bilirubin, Direct: 0.1 mg/dL (ref 0.0–0.3)
Total Bilirubin: 0.4 mg/dL (ref 0.2–1.2)
Total Protein: 6.8 g/dL (ref 6.0–8.3)

## 2022-09-23 LAB — HEMOGLOBIN A1C: Hgb A1c MFr Bld: 6.4 % (ref 4.6–6.5)

## 2022-09-23 LAB — PSA: PSA: 3.45 ng/mL (ref 0.10–4.00)

## 2022-09-24 DIAGNOSIS — G4733 Obstructive sleep apnea (adult) (pediatric): Secondary | ICD-10-CM | POA: Diagnosis not present

## 2022-09-30 ENCOUNTER — Ambulatory Visit: Payer: Medicare PPO | Admitting: Internal Medicine

## 2022-09-30 ENCOUNTER — Encounter: Payer: Self-pay | Admitting: Internal Medicine

## 2022-09-30 ENCOUNTER — Telehealth: Payer: Self-pay | Admitting: Internal Medicine

## 2022-09-30 VITALS — BP 128/76 | HR 65 | Temp 97.6°F | Ht 66.0 in | Wt 258.0 lb

## 2022-09-30 DIAGNOSIS — E538 Deficiency of other specified B group vitamins: Secondary | ICD-10-CM | POA: Diagnosis not present

## 2022-09-30 DIAGNOSIS — G4733 Obstructive sleep apnea (adult) (pediatric): Secondary | ICD-10-CM

## 2022-09-30 DIAGNOSIS — Z7984 Long term (current) use of oral hypoglycemic drugs: Secondary | ICD-10-CM

## 2022-09-30 DIAGNOSIS — E559 Vitamin D deficiency, unspecified: Secondary | ICD-10-CM

## 2022-09-30 DIAGNOSIS — E7800 Pure hypercholesterolemia, unspecified: Secondary | ICD-10-CM

## 2022-09-30 DIAGNOSIS — E785 Hyperlipidemia, unspecified: Secondary | ICD-10-CM

## 2022-09-30 DIAGNOSIS — E1169 Type 2 diabetes mellitus with other specified complication: Secondary | ICD-10-CM

## 2022-09-30 MED ORDER — TRAZODONE HCL 50 MG PO TABS: 50.0000 mg | ORAL_TABLET | Freq: Every evening | ORAL | 1 refills | Status: DC | PRN

## 2022-09-30 NOTE — Telephone Encounter (Signed)
Notified pt w/MD response.../lmb 

## 2022-09-30 NOTE — Assessment & Plan Note (Signed)
Currently on nightly bipap and needs it per wife, but encouraged for ongoing wt loss, and f/u with osa provider.

## 2022-09-30 NOTE — Telephone Encounter (Signed)
Ok Dr Mayford Knife is a cardiologist who really does not treat sleep apnea on regular basis  We should probably refer to Pulmonary who treat this mostly in [place] - I will refer, hopefully they will hear soon

## 2022-09-30 NOTE — Patient Instructions (Signed)
Please let us know the name of the previous provider for the sleep apnea for a new referral  Please continue all other medications as before, and refills have been done if requested.  Please have the pharmacy call with any other refills you may need.  Please continue your efforts at being more active, low cholesterol diet, and weight control.  Please keep your appointments with your specialists as you may have planned  Please make an Appointment to return in 6 months, or sooner if needed, also with Lab Appointment for testing done 3-5 days before at the FIRST FLOOR Lab (so this is for TWO appointments - please see the scheduling desk as you leave)

## 2022-09-30 NOTE — Assessment & Plan Note (Signed)
Last vitamin D Lab Results  Component Value Date   VD25OH 55.10 09/23/2022   Stable, cont oral replacement

## 2022-09-30 NOTE — Telephone Encounter (Signed)
Patient saw Dr. Jonny Ruiz today and was asked to call back with the name of the doctor he sees for sleep apnea. His wife called and said the name is Dr. Armanda Magic and that she is with [facility]. Best callback is [phone].
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
## 2022-10-02 ENCOUNTER — Encounter: Payer: Self-pay | Admitting: Internal Medicine

## 2022-10-02 NOTE — Assessment & Plan Note (Signed)
Lab Results  Component Value Date   HGBA1C 6.4 09/23/2022   Stable, pt to continue current medical treatment metfomrin 500 bid

## 2022-10-02 NOTE — Assessment & Plan Note (Signed)
Lab Results  Component Value Date   VITAMINB12 406 09/23/2022   Stable, cont oral replacement - b12 1000 mcg qd

## 2022-10-02 NOTE — Assessment & Plan Note (Signed)
Lab Results  Component Value Date   LDLCALC 42 09/23/2022   Stable, pt to continue current statin crestor 10 qd

## 2022-10-07 ENCOUNTER — Ambulatory Visit: Payer: Medicare PPO | Attending: Cardiology | Admitting: Cardiology

## 2022-10-07 ENCOUNTER — Encounter: Payer: Self-pay | Admitting: Cardiology

## 2022-10-07 VITALS — BP 124/78 | HR 77 | Ht 66.0 in | Wt 255.2 lb

## 2022-10-07 DIAGNOSIS — I4819 Other persistent atrial fibrillation: Secondary | ICD-10-CM | POA: Diagnosis not present

## 2022-10-07 DIAGNOSIS — I10 Essential (primary) hypertension: Secondary | ICD-10-CM

## 2022-10-07 DIAGNOSIS — I2510 Atherosclerotic heart disease of native coronary artery without angina pectoris: Secondary | ICD-10-CM | POA: Diagnosis not present

## 2022-10-07 DIAGNOSIS — D6869 Other thrombophilia: Secondary | ICD-10-CM | POA: Diagnosis not present

## 2022-10-07 DIAGNOSIS — E1169 Type 2 diabetes mellitus with other specified complication: Secondary | ICD-10-CM | POA: Diagnosis not present

## 2022-10-07 DIAGNOSIS — G4733 Obstructive sleep apnea (adult) (pediatric): Secondary | ICD-10-CM

## 2022-10-07 DIAGNOSIS — E1165 Type 2 diabetes mellitus with hyperglycemia: Secondary | ICD-10-CM | POA: Diagnosis not present

## 2022-10-07 NOTE — Patient Instructions (Signed)
Medication Instructions:  °Your physician recommends that you continue on your current medications as directed. Please refer to the Current Medication list given to you today. ° °*If you need a refill on your cardiac medications before your next appointment, please call your pharmacy* ° ° °Lab Work: °None ordered ° ° °Testing/Procedures: °None ordered ° ° °Follow-Up: °At [facility], you and your health needs are our priority.  As part of our continuing mission to provide you with exceptional heart care, we have created designated Provider Care Teams.  These Care Teams include your primary Cardiologist (physician) and Advanced Practice Providers (APPs -  Physician Assistants and Nurse Practitioners) who all work together to provide you with the care you need, when you need it. ° °Your next appointment:   °6 month(s) ° °The format for your next appointment:   °In Person ° °Provider:   °Will Camnitz, MD ° ° ° °Thank you for choosing [facility]!! ° ° °Mychelle Kendra, RN °[phone] °  °

## 2022-10-07 NOTE — Progress Notes (Signed)
  Electrophysiology Office Note:   Date:  10/07/2022  ID:  CODEN , DOB December 07, 1945, MRN [id number]  Primary Cardiologist: Charlton Haws, MD Electrophysiologist: Elloise Roark Jorja Loa, MD      History of Present Illness:   Eric Santos is a 77 y.o. male with h/o atrial fibrillation seen today for routine electrophysiology followup.  Since last being seen in our clinic the patient reports doing well.  He is converted back to sinus rhythm.  He was previously taking tramadol for pain in his hips, but has stopped taking this.  When he stopped taking the medication, he went back into sinus rhythm.  He has more energy and less fatigue or shortness of breath.  he denies chest pain, palpitations, dyspnea, PND, orthopnea, nausea, vomiting, dizziness, syncope, edema, weight gain, or early satiety.   Review of systems complete and found to be negative unless listed in HPI.   EP Information / Studies Reviewed:    EKG is not ordered today. EKG from 08/05/22 reviewed which showed atrial flutter        Risk Assessment/Calculations:    CHA2DS2-VASc Score = 4   This indicates a 4.8% annual risk of stroke. The patient's score is based upon: CHF History: 0 HTN History: 0 Diabetes History: 1 Stroke History: 0 Vascular Disease History: 1 Age Score: 2 Gender Score: 0             Physical Exam:   VS:  BP 124/78   Pulse 77   Ht 5\' 6"  (1.676 m)   Wt 255 lb 3.2 oz (115.8 kg)   SpO2 99%   BMI 41.19 kg/m    Wt Readings from Last 3 Encounters:  10/07/22 255 lb 3.2 oz (115.8 kg)  09/30/22 258 lb (117 kg)  08/05/22 260 lb (117.9 kg)     GEN: Well nourished, well developed in no acute distress NECK: No JVD; No carotid bruits CARDIAC: Regular rate and rhythm, no murmurs, rubs, gallops RESPIRATORY:  Clear to auscultation without rales, wheezing or rhonchi  ABDOMEN: Soft, non-tender, non-distended EXTREMITIES:  No edema; No deformity   ASSESSMENT AND PLAN:    1.  Persistent atrial  fibrillation/flutter: Currently on Eliquis, flecainide, diltiazem.  He is converted back to sinus rhythm.  He would prefer to avoid procedures for now.  [name] continue with current management.  2.  Coronary artery disease: Nonobstructive by catheterization in 2019.  No current chest pain.  Plan per primary cardiology.  3.  Hypertension: Currently well-controlled  4.  Obstructive sleep apnea: CPAP compliance encouraged  5.  Second hypercoagulable state: Currently on Eliquis for atrial fibrillation  Follow up with Dr. Elberta Fortis in 6 months  Signed, Chelesa Weingartner Jorja Loa, MD

## 2022-11-06 ENCOUNTER — Other Ambulatory Visit: Payer: Self-pay | Admitting: Cardiology

## 2022-11-06 DIAGNOSIS — I480 Paroxysmal atrial fibrillation: Secondary | ICD-10-CM

## 2022-11-06 NOTE — Telephone Encounter (Signed)
Prescription refill request for Eliquis received. Indication: AF Last office visit: 10/07/22 Carleene Mains MD Scr: 0.85 on 09/23/22  Epic Age: 77 Weight: 115.8kg  Based on above findings Eliquis 5mg  twice daily is the appropriate dose.  Refill approved.

## 2022-12-29 DIAGNOSIS — G4733 Obstructive sleep apnea (adult) (pediatric): Secondary | ICD-10-CM | POA: Diagnosis not present

## 2023-01-05 DIAGNOSIS — E1165 Type 2 diabetes mellitus with hyperglycemia: Secondary | ICD-10-CM | POA: Diagnosis not present

## 2023-01-05 DIAGNOSIS — E1169 Type 2 diabetes mellitus with other specified complication: Secondary | ICD-10-CM | POA: Diagnosis not present

## 2023-01-07 ENCOUNTER — Other Ambulatory Visit: Payer: Self-pay

## 2023-01-07 ENCOUNTER — Encounter: Payer: Self-pay | Admitting: Internal Medicine

## 2023-01-07 ENCOUNTER — Ambulatory Visit: Payer: Medicare PPO | Admitting: Internal Medicine

## 2023-01-07 VITALS — BP 120/75 | HR 70 | Temp 97.4°F | Ht 66.0 in | Wt 255.0 lb

## 2023-01-07 DIAGNOSIS — Z23 Encounter for immunization: Secondary | ICD-10-CM | POA: Diagnosis not present

## 2023-01-07 DIAGNOSIS — T8453XD Infection and inflammatory reaction due to internal right knee prosthesis, subsequent encounter: Secondary | ICD-10-CM | POA: Diagnosis not present

## 2023-01-07 MED ORDER — CEPHALEXIN 500 MG PO CAPS: 1000.0000 mg | ORAL_CAPSULE | Freq: Two times a day (BID) | ORAL | 3 refills | Status: DC

## 2023-01-07 MED ORDER — SULFAMETHOXAZOLE-TRIMETHOPRIM 800-160 MG PO TABS: 1.0000 | ORAL_TABLET | Freq: Every day | ORAL | 3 refills | Status: DC

## 2023-01-07 NOTE — Assessment & Plan Note (Signed)
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   No issues with the antibiotics and refills provided. Will check his bmp today Flu shot and CovID offered and given today  I have personally spent 35 minutes involved in face-to-face and non-face-to-face activities for this patient on the day of the visit. Professional time spent includes the following activities: Preparing to see the patient (review of tests), Obtaining and/or reviewing separately obtained history (admission/discharge record), Performing a medically appropriate examination and/or evaluation , Ordering medications/tests/procedures, referring and communicating with other health care professionals, Documenting clinical information in the EMR, Independently interpreting results (not separately reported), Communicating results to the patient/family/caregiver, Counseling and educating the patient/family/caregiver and Care coordination (not separately reported).

## 2023-01-07 NOTE — Progress Notes (Signed)
   Subjective:    Patient ID: Eric Santos, male    DOB: [birth year], 77 y.o.   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 Turner Daniels in November 2020 due to infection and culture with CoNS, oxacillin sensitive, clinda, tetracycline and TMP/SMX resistant.  He was on daptomycin for nearly 6 weeks but due to side effects was stopped a bit early.  He was then placed on Keflex after that, initially 4 times a day then reduced to 1 gram twice a day.  He then later last year developed more leg swelling and noted instability of his prosthetic joint and sent to Dr. Haskell Riling in [place] who performed a revision of the total knee on 01/05/20.      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  He was then changed to Bactrim and has been on that since that time and tolerating well.    He continues on Bactrim and cephalexin and having no issus.  No complaints, no associated rash or diarrhea.  No missed doses or concerns.   Labs in July reassuring with a normal creat.  He continues having no issues or concerns.    Review of Systems  Constitutional:  Negative for fatigue.  Gastrointestinal:  Negative for diarrhea and nausea.  Skin:  Negative for rash.       Objective:   Physical Exam Eyes:     General: No scleral icterus. Pulmonary:     Effort: Pulmonary effort is normal.  Musculoskeletal:        General: Normal range of motion.  Neurological:     Mental Status: He is alert.  Psychiatric:        Mood and Affect: Mood normal.   SH: no tobacco      Assessment & Plan:

## 2023-01-08 LAB — BASIC METABOLIC PANEL
BUN: 14 mg/dL (ref 7–25)
CO2: 29 mmol/L (ref 20–32)
Calcium: 10 mg/dL (ref 8.6–10.3)
Chloride: 103 mmol/L (ref 98–110)
Creat: 0.95 mg/dL (ref 0.70–1.28)
Glucose, Bld: 108 mg/dL — ABNORMAL HIGH (ref 65–99)
Potassium: 5.1 mmol/L (ref 3.5–5.3)
Sodium: 140 mmol/L (ref 135–146)

## 2023-01-18 DIAGNOSIS — C44329 Squamous cell carcinoma of skin of other parts of face: Secondary | ICD-10-CM | POA: Diagnosis not present

## 2023-01-18 DIAGNOSIS — L820 Inflamed seborrheic keratosis: Secondary | ICD-10-CM | POA: Diagnosis not present

## 2023-01-20 ENCOUNTER — Other Ambulatory Visit: Payer: Self-pay | Admitting: Physician Assistant

## 2023-02-05 ENCOUNTER — Other Ambulatory Visit: Payer: Self-pay | Admitting: Internal Medicine

## 2023-02-10 DIAGNOSIS — E119 Type 2 diabetes mellitus without complications: Secondary | ICD-10-CM | POA: Diagnosis not present

## 2023-02-10 LAB — HM DIABETES EYE EXAM

## 2023-02-12 ENCOUNTER — Telehealth: Payer: Self-pay | Admitting: Internal Medicine

## 2023-02-12 DIAGNOSIS — E785 Hyperlipidemia, unspecified: Secondary | ICD-10-CM

## 2023-02-12 DIAGNOSIS — E559 Vitamin D deficiency, unspecified: Secondary | ICD-10-CM

## 2023-02-12 DIAGNOSIS — Z125 Encounter for screening for malignant neoplasm of prostate: Secondary | ICD-10-CM

## 2023-02-12 DIAGNOSIS — E538 Deficiency of other specified B group vitamins: Secondary | ICD-10-CM

## 2023-02-12 NOTE — Telephone Encounter (Signed)
Pt has 6 month f/u 1/28 and lab appt 1/23.  Please enter orders for lab visit.

## 2023-02-12 NOTE — Telephone Encounter (Signed)
Ok labs done 

## 2023-03-27 ENCOUNTER — Other Ambulatory Visit: Payer: Medicare PPO

## 2023-03-27 DIAGNOSIS — Z125 Encounter for screening for malignant neoplasm of prostate: Secondary | ICD-10-CM

## 2023-03-27 DIAGNOSIS — E559 Vitamin D deficiency, unspecified: Secondary | ICD-10-CM | POA: Diagnosis not present

## 2023-03-27 DIAGNOSIS — E1169 Type 2 diabetes mellitus with other specified complication: Secondary | ICD-10-CM

## 2023-03-27 DIAGNOSIS — E785 Hyperlipidemia, unspecified: Secondary | ICD-10-CM | POA: Diagnosis not present

## 2023-03-27 DIAGNOSIS — E538 Deficiency of other specified B group vitamins: Secondary | ICD-10-CM

## 2023-03-27 LAB — HEPATIC FUNCTION PANEL
ALT: 15 U/L (ref 0–53)
AST: 15 U/L (ref 0–37)
Albumin: 4.4 g/dL (ref 3.5–5.2)
Alkaline Phosphatase: 69 U/L (ref 39–117)
Bilirubin, Direct: 0.1 mg/dL (ref 0.0–0.3)
Total Bilirubin: 0.5 mg/dL (ref 0.2–1.2)
Total Protein: 7 g/dL (ref 6.0–8.3)

## 2023-03-27 LAB — CBC WITH DIFFERENTIAL/PLATELET
Basophils Absolute: 0 10*3/uL (ref 0.0–0.1)
Basophils Relative: 0.6 % (ref 0.0–3.0)
Eosinophils Absolute: 0.2 10*3/uL (ref 0.0–0.7)
Eosinophils Relative: 2 % (ref 0.0–5.0)
HCT: 44.1 % (ref 39.0–52.0)
Hemoglobin: 14.4 g/dL (ref 13.0–17.0)
Lymphocytes Relative: 21.4 % (ref 12.0–46.0)
Lymphs Abs: 1.8 10*3/uL (ref 0.7–4.0)
MCHC: 32.7 g/dL (ref 30.0–36.0)
MCV: 88.6 fL (ref 78.0–100.0)
Monocytes Absolute: 0.9 10*3/uL (ref 0.1–1.0)
Monocytes Relative: 10 % (ref 3.0–12.0)
Neutro Abs: 5.6 10*3/uL (ref 1.4–7.7)
Neutrophils Relative %: 66 % (ref 43.0–77.0)
Platelets: 303 10*3/uL (ref 150.0–400.0)
RBC: 4.98 Mil/uL (ref 4.22–5.81)
RDW: 14.3 % (ref 11.5–15.5)
WBC: 8.5 10*3/uL (ref 4.0–10.5)

## 2023-03-27 LAB — URINALYSIS, ROUTINE W REFLEX MICROSCOPIC
Bilirubin Urine: NEGATIVE
Ketones, ur: NEGATIVE
Leukocytes,Ua: NEGATIVE
Nitrite: NEGATIVE
Specific Gravity, Urine: >=1.03 — AB (ref 1.000–1.030)
Total Protein, Urine: NEGATIVE
Urine Glucose: NEGATIVE
Urobilinogen, UA: 0.2 (ref 0.0–1.0)
pH: 5.5 (ref 5.0–8.0)

## 2023-03-27 LAB — MICROALBUMIN / CREATININE URINE RATIO
Creatinine,U: 163.2 mg/dL
Microalb Creat Ratio: 1.8 mg/g (ref 0.0–30.0)
Microalb, Ur: 3 mg/dL — ABNORMAL HIGH (ref 0.0–1.9)

## 2023-03-27 LAB — VITAMIN D 25 HYDROXY (VIT D DEFICIENCY, FRACTURES): VITD: 60.54 ng/mL (ref 30.00–100.00)

## 2023-03-27 LAB — HEMOGLOBIN A1C: Hgb A1c MFr Bld: 6.5 % (ref 4.6–6.5)

## 2023-03-27 LAB — LIPID PANEL
Cholesterol: 107 mg/dL (ref 0–200)
HDL: 36.4 mg/dL — ABNORMAL LOW (ref >39.00)
LDL Cholesterol: 42 mg/dL (ref 0–99)
NonHDL: 70.86
Total CHOL/HDL Ratio: 3
Triglycerides: 142 mg/dL (ref 0.0–149.0)
VLDL: 28.4 mg/dL (ref 0.0–40.0)

## 2023-03-27 LAB — BASIC METABOLIC PANEL
BUN: 17 mg/dL (ref 6–23)
CO2: 28 meq/L (ref 19–32)
Calcium: 9.8 mg/dL (ref 8.4–10.5)
Chloride: 102 meq/L (ref 96–112)
Creatinine, Ser: 0.93 mg/dL (ref 0.40–1.50)
GFR: 79.9 mL/min (ref >60.00)
Glucose, Bld: 111 mg/dL — ABNORMAL HIGH (ref 70–99)
Potassium: 5.2 meq/L — ABNORMAL HIGH (ref 3.5–5.1)
Sodium: 141 meq/L (ref 135–145)

## 2023-03-27 LAB — PSA: PSA: 5.31 ng/mL — ABNORMAL HIGH (ref 0.10–4.00)

## 2023-03-27 LAB — TSH: TSH: 2.71 u[IU]/mL (ref 0.35–5.50)

## 2023-03-27 LAB — VITAMIN B12: Vitamin B-12: 380 pg/mL (ref 211–911)

## 2023-03-30 DIAGNOSIS — G4733 Obstructive sleep apnea (adult) (pediatric): Secondary | ICD-10-CM | POA: Diagnosis not present

## 2023-04-01 ENCOUNTER — Other Ambulatory Visit: Payer: Self-pay

## 2023-04-01 ENCOUNTER — Ambulatory Visit: Payer: Medicare PPO | Admitting: Internal Medicine

## 2023-04-01 ENCOUNTER — Ambulatory Visit: Payer: Medicare PPO | Attending: Cardiology | Admitting: Cardiology

## 2023-04-01 ENCOUNTER — Encounter: Payer: Self-pay | Admitting: Cardiology

## 2023-04-01 ENCOUNTER — Encounter: Payer: Self-pay | Admitting: Internal Medicine

## 2023-04-01 VITALS — BP 130/66 | HR 82 | Ht 66.0 in | Wt 259.4 lb

## 2023-04-01 VITALS — BP 130/76 | HR 67 | Temp 97.9°F | Ht 66.0 in | Wt 258.0 lb

## 2023-04-01 DIAGNOSIS — E785 Hyperlipidemia, unspecified: Secondary | ICD-10-CM

## 2023-04-01 DIAGNOSIS — E559 Vitamin D deficiency, unspecified: Secondary | ICD-10-CM

## 2023-04-01 DIAGNOSIS — Z0001 Encounter for general adult medical examination with abnormal findings: Secondary | ICD-10-CM | POA: Diagnosis not present

## 2023-04-01 DIAGNOSIS — Z7985 Long-term (current) use of injectable non-insulin antidiabetic drugs: Secondary | ICD-10-CM

## 2023-04-01 DIAGNOSIS — I10 Essential (primary) hypertension: Secondary | ICD-10-CM | POA: Diagnosis not present

## 2023-04-01 DIAGNOSIS — G4733 Obstructive sleep apnea (adult) (pediatric): Secondary | ICD-10-CM

## 2023-04-01 DIAGNOSIS — E538 Deficiency of other specified B group vitamins: Secondary | ICD-10-CM

## 2023-04-01 DIAGNOSIS — E1169 Type 2 diabetes mellitus with other specified complication: Secondary | ICD-10-CM | POA: Diagnosis not present

## 2023-04-01 DIAGNOSIS — I4819 Other persistent atrial fibrillation: Secondary | ICD-10-CM

## 2023-04-01 DIAGNOSIS — E7800 Pure hypercholesterolemia, unspecified: Secondary | ICD-10-CM | POA: Diagnosis not present

## 2023-04-01 MED ORDER — TIRZEPATIDE 2.5 MG/0.5ML ~~LOC~~ SOAJ: 2.5000 mg | SUBCUTANEOUS | 11 refills | Status: DC
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
## 2023-04-01 NOTE — Progress Notes (Signed)
Supply order sent to [facility] 04/01/23

## 2023-04-01 NOTE — Patient Instructions (Signed)
Please take all new medication as prescribed  - the low dose mounjaro 2.5 mg weekly, And let us know in 1 month if you are doing well, for the next higher dose  Please continue all other medications as before, and refills have been done if requested.  Please have the pharmacy call with any other refills you may need.  Please continue your efforts at being more active, low cholesterol diet, and weight control.  You are otherwise up to date with prevention measures today.  Please keep your appointments with your specialists as you may have planned  Please make an Appointment to return in 6 months, or sooner if needed, also with Lab Appointment for testing done 3-5 days before at the FIRST FLOOR Lab (so this is for TWO appointments - please see the scheduling desk as you leave)

## 2023-04-01 NOTE — Patient Instructions (Signed)
Medication Instructions:  Your physician recommends that you continue on your current medications as directed. Please refer to the Current Medication list given to you today.  *If you need a refill on your cardiac medications before your next appointment, please call your pharmacy*   Lab Work: None.  If you have labs (blood work) drawn today and your tests are completely normal, you will receive your results only by: MyChart Message (if you have MyChart) OR A paper copy in the mail If you have any lab test that is abnormal or we need to change your treatment, we will call you to review the results.   Testing/Procedures: None.   Follow-Up:  Your next appointment:   1 year(s)  Provider:   Dr. Armanda Magic, MD
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
## 2023-04-02 ENCOUNTER — Other Ambulatory Visit: Payer: Self-pay | Admitting: Cardiovascular Disease

## 2023-04-02 DIAGNOSIS — I480 Paroxysmal atrial fibrillation: Secondary | ICD-10-CM

## 2023-04-02 NOTE — Telephone Encounter (Signed)
Eliquis 5mg  refill request received. Patient is 78 years old, weight-117.7kg, Crea-0.93 on 03/27/23, Diagnosis-Afib, and last seen by Dr. Mayford Knife on 04/01/23. Dose is appropriate based on dosing criteria. Will send in refill to requested pharmacy.

## 2023-04-05 ENCOUNTER — Encounter: Payer: Self-pay | Admitting: Internal Medicine

## 2023-04-05 DIAGNOSIS — E1169 Type 2 diabetes mellitus with other specified complication: Secondary | ICD-10-CM | POA: Diagnosis not present

## 2023-04-05 DIAGNOSIS — E1165 Type 2 diabetes mellitus with hyperglycemia: Secondary | ICD-10-CM | POA: Diagnosis not present

## 2023-04-05 NOTE — Assessment & Plan Note (Signed)
Lab Results  Component Value Date   VITAMINB12 380 03/27/2023   Stable, cont oral replacement - b12 1000 mcg qd

## 2023-04-05 NOTE — Assessment & Plan Note (Signed)
Lab Results  Component Value Date   HGBA1C 6.5 03/27/2023   With uncontrolled obesity, pt to continue current medical treatment  -start mounjaro 2.5 mg weekly

## 2023-04-05 NOTE — Assessment & Plan Note (Signed)
 Age and sex appropriate education and counseling updated with regular exercise and diet Referrals for preventative services - none needed Immunizations addressed - declines covid booster Smoking counseling  - none needed Evidence for depression or other mood disorder - none significant Most recent labs reviewed. I have personally reviewed and have noted: 1) the patient's medical and social history 2) The patient's current medications and supplements 3) The patient's height, weight, and BMI have been recorded in the chart

## 2023-04-05 NOTE — Assessment & Plan Note (Signed)
Lab Results  Component Value Date   LDLCALC 42 03/27/2023   Stable, pt to continue current statin crestor 20 qd

## 2023-04-05 NOTE — Assessment & Plan Note (Signed)
Last vitamin D Lab Results  Component Value Date   VD25OH 60.54 03/27/2023   Stable, cont oral replacement

## 2023-04-24 ENCOUNTER — Telehealth: Payer: Self-pay | Admitting: Radiology

## 2023-04-24 MED ORDER — TIRZEPATIDE 2.5 MG/0.5ML ~~LOC~~ SOAJ: 2.5000 mg | SUBCUTANEOUS | 11 refills | Status: DC

## 2023-04-24 NOTE — Addendum Note (Signed)
 Addended by: Corwin Levins on: 04/24/2023 11:50 AM   Modules accepted: Orders

## 2023-04-24 NOTE — Telephone Encounter (Signed)
 Ok this is done for the 2.5 mg since there was no mention of asking for increased dose

## 2023-04-24 NOTE — Telephone Encounter (Signed)
 Copied from CRM [id number]. Topic: Clinical - Medical Advice >> Apr 24, 2023  8:49 AM Isabell A wrote: Reason for CRM: Spouse calling to inform Dr.John about the patient using Mounjaro. States he's been using the 2.5mg  with no side effects & lost 5 lbs. If he wants him to stay on it she is requesting a refill sent to [facility], he took his last shot this morning.

## 2023-04-26 ENCOUNTER — Other Ambulatory Visit: Payer: Self-pay | Admitting: Internal Medicine

## 2023-04-28 ENCOUNTER — Other Ambulatory Visit: Payer: Self-pay
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
## 2023-04-30 ENCOUNTER — Other Ambulatory Visit: Payer: Self-pay

## 2023-04-30 ENCOUNTER — Other Ambulatory Visit: Payer: Self-pay | Admitting: Internal Medicine

## 2023-05-07 ENCOUNTER — Ambulatory Visit: Payer: Medicare PPO | Attending: Cardiovascular Disease | Admitting: Cardiovascular Disease

## 2023-05-07 ENCOUNTER — Encounter: Payer: Self-pay | Admitting: Cardiovascular Disease

## 2023-05-07 VITALS — BP 100/70 | HR 72 | Ht 66.0 in | Wt 257.0 lb

## 2023-05-07 DIAGNOSIS — E782 Mixed hyperlipidemia: Secondary | ICD-10-CM

## 2023-05-07 DIAGNOSIS — G4733 Obstructive sleep apnea (adult) (pediatric): Secondary | ICD-10-CM

## 2023-05-07 DIAGNOSIS — I480 Paroxysmal atrial fibrillation: Secondary | ICD-10-CM

## 2023-05-07 DIAGNOSIS — I10 Essential (primary) hypertension: Secondary | ICD-10-CM | POA: Diagnosis not present

## 2023-05-07 NOTE — Patient Instructions (Signed)

## 2023-05-16 ENCOUNTER — Ambulatory Visit: Payer: Medicare PPO

## 2023-05-16 VITALS — BP 105/70 | HR 64 | Ht 67.5 in | Wt 254.0 lb

## 2023-05-16 DIAGNOSIS — Z0000 Encounter for general adult medical examination without abnormal findings: Secondary | ICD-10-CM

## 2023-05-16 NOTE — Patient Instructions (Signed)
 Mr. Eric Santos , Thank you for taking time to come for your Medicare Wellness Visit. I appreciate your ongoing commitment to your health goals. Please review the following plan we discussed and let me know if I can assist you in the future.   Referrals/Orders/Follow-Ups/Clinician Recommendations: It was nice to meet you today.  Keep up the good work.  This is a list of the screening recommended for you and due dates:  Health Maintenance  Topic Date Due   Medicare Annual Wellness Visit  12/21/2021   COVID-19 Vaccine (6 - Moderna risk 2024-25 season) 07/07/2023   Hemoglobin A1C  09/24/2023   Eye exam for diabetics  02/10/2024   Yearly kidney function blood test for diabetes  03/26/2024   Yearly kidney health urinalysis for diabetes  03/26/2024   Complete foot exam   03/31/2024   DTaP/Tdap/Td vaccine (4 - Td or Tdap) 01/23/2028   Pneumonia Vaccine  Completed   Flu Shot  Completed   Hepatitis C Screening  Completed   HPV Vaccine  Aged Out   Colon Cancer Screening  Discontinued   Zoster (Shingles) Vaccine  Discontinued    Advanced directives: (Copy Requested) Please bring a copy of your health care power of attorney and living will to the office to be added to your chart at your convenience. You can mail to [facility] [address] [place], [place] [address] or email to [email or web address]  Next Medicare Annual Wellness Visit scheduled for next year: Yes
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
## 2023-05-21 ENCOUNTER — Other Ambulatory Visit: Payer: Self-pay | Admitting: Cardiology

## 2023-05-28 DIAGNOSIS — H02834 Dermatochalasis of left upper eyelid: Secondary | ICD-10-CM | POA: Diagnosis not present

## 2023-05-28 DIAGNOSIS — H02403 Unspecified ptosis of bilateral eyelids: Secondary | ICD-10-CM | POA: Diagnosis not present

## 2023-05-28 DIAGNOSIS — H02831 Dermatochalasis of right upper eyelid: Secondary | ICD-10-CM | POA: Diagnosis not present

## 2023-06-30 ENCOUNTER — Telehealth: Payer: Self-pay | Admitting: Cardiovascular Disease

## 2023-06-30 DIAGNOSIS — G4733 Obstructive sleep apnea (adult) (pediatric): Secondary | ICD-10-CM | POA: Diagnosis not present

## 2023-06-30 MED ORDER — FLECAINIDE ACETATE 100 MG PO TABS: 100.0000 mg | ORAL_TABLET | Freq: Two times a day (BID) | ORAL | 1 refills | Status: DC

## 2023-06-30 NOTE — Telephone Encounter (Signed)
 Spoke to pt and wife.  Pt has been taking Flecainide  100 mg BID since last summer.  Aware his MAR does NOT reflect this, but see where he was instructed to by [facility] office last July.  Aware will discuss with MD and let them know.  They are agreeable to plan.

## 2023-06-30 NOTE — Telephone Encounter (Signed)
 Pt c/o medication issue:  1. Name of Medication: flecainide  (TAMBOCOR ) 50 MG tablet   2. How are you currently taking this medication (dosage and times per day)? No  3. Are you having a reaction (difficulty breathing--STAT)? No  4. What is your medication issue? Pt was told to take two tablets daily at the last appt. Please update instructions

## 2023-06-30 NOTE — Telephone Encounter (Signed)
 Called patient back about message. He stated Dr. Lawana Pray wanted him to take Flecainide  100 mg BID. Patient was taking 75 mg BID. Will clarify with Dr. Lawana Pray the right dose and send to patient's pharmacy of choice.

## 2023-06-30 NOTE — Telephone Encounter (Signed)
 Spoke to wife, aware I will send in Rx for 100 mg tablets to take twice daily after approval from Dr. Lawana Pray.   Aware office will call  to arrange appt for sometime this summer with Dr. Lawana Pray in [place].

## 2023-07-01 NOTE — Telephone Encounter (Signed)
 Patient scheduled for [place] in August/kbl 07/01/23

## 2023-07-04 DIAGNOSIS — E1169 Type 2 diabetes mellitus with other specified complication: Secondary | ICD-10-CM | POA: Diagnosis not present

## 2023-07-04 DIAGNOSIS — E1165 Type 2 diabetes mellitus with hyperglycemia: Secondary | ICD-10-CM | POA: Diagnosis not present

## 2023-07-24 ENCOUNTER — Telehealth: Payer: Self-pay | Admitting: Cardiovascular Disease

## 2023-07-24 NOTE — Telephone Encounter (Signed)
 Patient with diagnosis of Afib on Eliquis  for anticoagulation.    Procedure: Blepharoplasty  Date of procedure: 08/05/23   CHA2DS2-VASc Score = 4   This indicates a 4.8% annual risk of stroke. The patient's score is based upon: CHF History: 0 HTN History: 0 Diabetes History: 1 Stroke History: 0 Vascular Disease History: 1 Age Score: 2 Gender Score: 0     CrCl 81 mL/min  Platelet count 303 K  Patient has not  had an Afib/aflutter ablation within the last 3 months or DCCV within the last 30 days   Per office protocol, patient can hold Eliquis  for 2 days prior to procedure.     **This guidance is not considered finalized until pre-operative APP has relayed final recommendations.**

## 2023-07-24 NOTE — Telephone Encounter (Signed)
   Patient Name: Eric Santos  DOB: 1945/06/04 MRN: [id number]  Primary Cardiologist: Janelle Mediate, MD  Clinical pharmacists have reviewed the patient's past medical history, labs, and current medications as part of preoperative protocol coverage. The following recommendations have been made:  Patient has not  had an Afib/aflutter ablation within the last 3 months or DCCV within the last 30 days    Per office protocol, patient can hold Eliquis  for 2 days prior to procedure.    I will route this recommendation to the requesting party via Epic fax function and remove from pre-op pool.  Please call with questions.  Francene Ing, Retha Cast, NP 07/24/2023, 12:48 PM

## 2023-07-24 NOTE — Telephone Encounter (Signed)
   Pre-operative Risk Assessment    Patient Name: Eric Santos  DOB: 09-01-1945 MRN: [id number]   Date of last office visit: 05/07/23 Date of next office visit: 07/27/23   Request for Surgical Clearance    Procedure:  Blepharoplasty  Date of Surgery:  Clearance 08/05/23                                Surgeon:  Dr. Aleatha Hunting Surgeon's Group or Practice Name:  [facility] Phone number:  [phone]  Fax number:  [phone]   Type of Clearance Requested:   - Pharmacy:  Hold Apixaban  (Eliquis )     Type of Anesthesia:  Local    Additional requests/questions:  Caller Porfirio Bristol) stated patient will need to hold his Eliquis  for 2 days.  Signed, Jasmin B Wilson   07/24/2023, 10:18 AM

## 2023-07-24 NOTE — Telephone Encounter (Signed)
 Pharmacy please advise on holding Eliquis  prior to blepharoplasty scheduled for/30/25. Thank you.

## 2023-08-05 DIAGNOSIS — H02403 Unspecified ptosis of bilateral eyelids: Secondary | ICD-10-CM | POA: Diagnosis not present

## 2023-08-05 DIAGNOSIS — H02834 Dermatochalasis of left upper eyelid: Secondary | ICD-10-CM | POA: Diagnosis not present

## 2023-08-05 DIAGNOSIS — H02831 Dermatochalasis of right upper eyelid: Secondary | ICD-10-CM | POA: Diagnosis not present

## 2023-08-23 DIAGNOSIS — L820 Inflamed seborrheic keratosis: Secondary | ICD-10-CM | POA: Diagnosis not present

## 2023-08-23 DIAGNOSIS — L821 Other seborrheic keratosis: Secondary | ICD-10-CM | POA: Diagnosis not present

## 2023-08-23 DIAGNOSIS — L570 Actinic keratosis: Secondary | ICD-10-CM | POA: Diagnosis not present

## 2023-08-23 DIAGNOSIS — D485 Neoplasm of uncertain behavior of skin: Secondary | ICD-10-CM | POA: Diagnosis not present

## 2023-08-27 ENCOUNTER — Other Ambulatory Visit: Payer: Self-pay | Admitting: Cardiovascular Disease

## 2023-08-27 DIAGNOSIS — I480 Paroxysmal atrial fibrillation: Secondary | ICD-10-CM

## 2023-08-27 NOTE — Telephone Encounter (Signed)
 Prescription refill request for Eliquis  received. Indication:afib Last office visit:3/25 Scr:0.93  1/25 Age: 78 Weight:115.2  kg  Prescription refilled

## 2023-09-16 ENCOUNTER — Other Ambulatory Visit: Payer: Self-pay

## 2023-09-16 ENCOUNTER — Ambulatory Visit: Admitting: Internal Medicine

## 2023-09-16 ENCOUNTER — Encounter: Payer: Self-pay | Admitting: Internal Medicine

## 2023-09-16 ENCOUNTER — Ambulatory Visit: Payer: Medicare PPO | Admitting: Internal Medicine

## 2023-09-16 VITALS — BP 128/79 | HR 65 | Temp 97.8°F | Wt 252.0 lb

## 2023-09-16 DIAGNOSIS — B9562 Methicillin resistant Staphylococcus aureus infection as the cause of diseases classified elsewhere: Secondary | ICD-10-CM

## 2023-09-16 DIAGNOSIS — T8450XD Infection and inflammatory reaction due to unspecified internal joint prosthesis, subsequent encounter: Secondary | ICD-10-CM | POA: Diagnosis not present

## 2023-09-16 NOTE — Patient Instructions (Signed)
 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

## 2023-09-16 NOTE — Progress Notes (Signed)
 Subjective:    Patient ID: Eric Santos, male    DOB: [birth year], 78 y.o.   MRN: [id number]  HPI Here for follow up of a chronic right knee prosthetic joint infection.   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 Liam in November 2020 due to infection and culture with CoNS, oxacillin sensitive, clinda, tetracycline and TMP/SMX resistant.  He was on daptomycin  for nearly 6 weeks but due to side effects was stopped a bit early.  He was then placed on Keflex  after that, initially 4 times a day then reduced to 1 gram twice a day.  He then later last year developed more leg swelling and noted instability of his prosthetic joint and sent to Dr. Domenick in [place] who performed a revision of the total knee on 01/05/20.    In addition to above, he has a history of an MRSA infection in 2008 with this joint and underwent two stage revision at that time and had remained on minocycline  chronically since that time with concern for chronic infection but developed a likely minocycline -induced sun rash in 2020.  He was then changed to Bactrim  and has been on that since that time and tolerating well.    09/16/23 [facility] f/u First visit with me Reviewed previous chart [place] with dr Eric Santos 01/07/23. Hx mrsa and CoNS right knee prosthetic joint infection continued on bactrim  and cephalexin   I reviewed 01/05/20 [facility] operative note -- 2 set of cultures sent and were negative Medial parapatellar arthrotomy was performed after obtaining joint fluid which was clear. This was sent for culture. Full-thickness medial and lateral synovectomy was obtained and sent for culture. I performed medial release. The knee was examined. There was no metal staining to suggest trunnionosis. The femoral and tibial components were well fixed. The medial collateral ligament was noted to be elongated. I examined the post and there was significant  polyethylene wear on the post. The polyethylene component was removed. The posterior synovium was excised. I trialed for stability with a 30 mm insert. The one we removed was 25 mm. I was satisfied with knee stability to varus and valgus stress in full extension and through the range of motion. He was able to flex to 90 and patellar tracking was neutral. I decided to accept this. The knee was irrigated with copious saline as well as Betadine and peroxide. The final polyethylene was inserted  The femoral/tibial stem were in good function and kept; only the polyethylene liner was exchanged  He has no complaint with the knee He is walking with cane He said that if something happens again to the knee the next surgical step would be above knee amputation     Review of Systems  Constitutional:  Negative for fatigue.  Gastrointestinal:  Negative for diarrhea and nausea.  Skin:  Negative for rash.       Objective:   Physical Exam Eyes:     General: No scleral icterus. Pulmonary:     Effort: Pulmonary effort is normal.  Musculoskeletal:        General: Normal range of motion.  Neurological:     Mental Status: He is alert.  Psychiatric:        Mood and Affect: Mood normal.     Labs: Lab Results  Component Value Date   WBC 8.5 03/27/2023   HGB 14.4 03/27/2023   HCT 44.1 03/27/2023   MCV 88.6  03/27/2023   PLT 303.0 03/27/2023   Last metabolic panel Lab Results  Component Value Date   GLUCOSE 111 (H) 03/27/2023   NA 141 03/27/2023   K 5.2 No hemolysis seen (H) 03/27/2023   CL 102 03/27/2023   CO2 28 03/27/2023   BUN 17 03/27/2023   CREATININE 0.93 03/27/2023   GFR 79.90 03/27/2023   CALCIUM  9.8 03/27/2023   PROT 7.0 03/27/2023   ALBUMIN 4.4 03/27/2023   BILITOT 0.5 03/27/2023   ALKPHOS 69 03/27/2023   AST 15 03/27/2023   ALT 15 03/27/2023   ANIONGAP 10 03/04/2019   Lab Results  Component Value Date   CRP 17.1 (H) 09/11/2020   Lab Results  Component Value Date    ESRSEDRATE 36 (H) 09/11/2020         Assessment & Plan:    Right knee prosthetic joint recurrent infection 2008 mrsa s/p 2 stage revision. However remains on bactrim  ds 1 tablet suppression; sun sensitivity to minocycline  2020 recurrent infection CoNS (S cephalexin ; R bactrim /tetracycline) 01/2020 ?mechanical wear issue and underwent another rrevision -- cx negative; polyethylene liner exchange   09/16/23 query if bactrim  really needed, and that cephalexin  dose could be decrease to 500 mg bid dosing for suppression  Discussed trial off abx but we don't know if infection comes back -- he doesn't have good option if it does (at least for CoNS)  Plan to decrease cephalexin  and potentially getting off bactrim  at some point    -labs today -start cephalexin  500 bid -continue bactirm -f/u 6 weeks

## 2023-09-17 LAB — CBC
HCT: 46.1 % (ref 38.5–50.0)
Hemoglobin: 14.4 g/dL (ref 13.2–17.1)
MCH: 28.3 pg (ref 27.0–33.0)
MCHC: 31.2 g/dL — ABNORMAL LOW (ref 32.0–36.0)
MCV: 90.6 fL (ref 80.0–100.0)
MPV: 11.6 fL (ref 7.5–12.5)
Platelets: 266 Thousand/uL (ref 140–400)
RBC: 5.09 Million/uL (ref 4.20–5.80)
RDW: 13.8 % (ref 11.0–15.0)
WBC: 9.5 Thousand/uL (ref 3.8–10.8)

## 2023-09-17 LAB — COMPLETE METABOLIC PANEL WITHOUT GFR
AG Ratio: 1.8 (calc) (ref 1.0–2.5)
ALT: 16 U/L (ref 9–46)
AST: 16 U/L (ref 10–35)
Albumin: 4.4 g/dL (ref 3.6–5.1)
Alkaline phosphatase (APISO): 72 U/L (ref 35–144)
BUN: 19 mg/dL (ref 7–25)
CO2: 29 mmol/L (ref 20–32)
Calcium: 10 mg/dL (ref 8.6–10.3)
Chloride: 103 mmol/L (ref 98–110)
Creat: 0.97 mg/dL (ref 0.70–1.28)
Globulin: 2.5 g/dL (ref 1.9–3.7)
Glucose, Bld: 93 mg/dL (ref 65–99)
Potassium: 4.9 mmol/L (ref 3.5–5.3)
Sodium: 140 mmol/L (ref 135–146)
Total Bilirubin: 0.3 mg/dL (ref 0.2–1.2)
Total Protein: 6.9 g/dL (ref 6.1–8.1)

## 2023-09-17 LAB — SEDIMENTATION RATE: Sed Rate: 17 mm/h (ref 0–20)

## 2023-09-17 LAB — C-REACTIVE PROTEIN: CRP: 15.4 mg/L — ABNORMAL HIGH (ref <8.0)

## 2023-09-24 ENCOUNTER — Other Ambulatory Visit (INDEPENDENT_AMBULATORY_CARE_PROVIDER_SITE_OTHER): Payer: Medicare PPO

## 2023-09-24 ENCOUNTER — Telehealth: Payer: Self-pay | Admitting: Pharmacist

## 2023-09-24 DIAGNOSIS — E785 Hyperlipidemia, unspecified: Secondary | ICD-10-CM | POA: Diagnosis not present

## 2023-09-24 DIAGNOSIS — E1169 Type 2 diabetes mellitus with other specified complication: Secondary | ICD-10-CM

## 2023-09-24 DIAGNOSIS — E559 Vitamin D deficiency, unspecified: Secondary | ICD-10-CM

## 2023-09-24 LAB — BASIC METABOLIC PANEL WITH GFR
BUN: 17 mg/dL (ref 6–23)
CO2: 27 meq/L (ref 19–32)
Calcium: 9.5 mg/dL (ref 8.4–10.5)
Chloride: 103 meq/L (ref 96–112)
Creatinine, Ser: 0.89 mg/dL (ref 0.40–1.50)
GFR: 83.1 mL/min (ref >60.00)
Glucose, Bld: 98 mg/dL (ref 70–99)
Potassium: 4.4 meq/L (ref 3.5–5.1)
Sodium: 139 meq/L (ref 135–145)

## 2023-09-24 LAB — LIPID PANEL
Cholesterol: 91 mg/dL (ref 0–200)
HDL: 32.8 mg/dL — ABNORMAL LOW (ref >39.00)
LDL Cholesterol: 35 mg/dL (ref 0–99)
NonHDL: 58.61
Total CHOL/HDL Ratio: 3
Triglycerides: 117 mg/dL (ref 0.0–149.0)
VLDL: 23.4 mg/dL (ref 0.0–40.0)

## 2023-09-24 LAB — HEPATIC FUNCTION PANEL
ALT: 18 U/L (ref 0–53)
AST: 17 U/L (ref 0–37)
Albumin: 4.3 g/dL (ref 3.5–5.2)
Alkaline Phosphatase: 66 U/L (ref 39–117)
Bilirubin, Direct: 0.2 mg/dL (ref 0.0–0.3)
Total Bilirubin: 0.5 mg/dL (ref 0.2–1.2)
Total Protein: 7.3 g/dL (ref 6.0–8.3)

## 2023-09-24 LAB — VITAMIN D 25 HYDROXY (VIT D DEFICIENCY, FRACTURES): VITD: 70.13 ng/mL (ref 30.00–100.00)

## 2023-09-24 LAB — HEMOGLOBIN A1C: Hgb A1c MFr Bld: 6.1 % (ref 4.6–6.5)

## 2023-09-24 NOTE — Progress Notes (Signed)
 Pharmacy Quality Measure Review  This patient is appearing on a report for being at risk of failing the adherence measure for diabetes medications this calendar year.   Medication: Mounjaro  2.5 mg Last fill date: 08/20/23 for 28 day supply  Insurance report was not up to date. No action needed at this time.  Appears overdue for Mounjaro  this month, but is not past last impact date. Has PCP upcoming on Monday.  Darrelyn Drum, PharmD, BCPS, CPP Clinical Pharmacist Practitioner [facility] [phone]

## 2023-09-29 ENCOUNTER — Encounter: Payer: Self-pay | Admitting: Internal Medicine

## 2023-09-29 ENCOUNTER — Ambulatory Visit: Payer: Medicare PPO | Admitting: Internal Medicine

## 2023-09-29 ENCOUNTER — Telehealth: Payer: Self-pay | Admitting: Internal Medicine

## 2023-09-29 VITALS — BP 122/80 | HR 66 | Temp 97.8°F | Ht 67.5 in | Wt 250.2 lb

## 2023-09-29 DIAGNOSIS — Z7985 Long-term (current) use of injectable non-insulin antidiabetic drugs: Secondary | ICD-10-CM

## 2023-09-29 DIAGNOSIS — E538 Deficiency of other specified B group vitamins: Secondary | ICD-10-CM | POA: Diagnosis not present

## 2023-09-29 DIAGNOSIS — G4733 Obstructive sleep apnea (adult) (pediatric): Secondary | ICD-10-CM | POA: Diagnosis not present

## 2023-09-29 DIAGNOSIS — E785 Hyperlipidemia, unspecified: Secondary | ICD-10-CM | POA: Diagnosis not present

## 2023-09-29 DIAGNOSIS — E1169 Type 2 diabetes mellitus with other specified complication: Secondary | ICD-10-CM

## 2023-09-29 DIAGNOSIS — E559 Vitamin D deficiency, unspecified: Secondary | ICD-10-CM

## 2023-09-29 DIAGNOSIS — E7800 Pure hypercholesterolemia, unspecified: Secondary | ICD-10-CM

## 2023-09-29 MED ORDER — TIRZEPATIDE 5 MG/0.5ML ~~LOC~~ SOAJ: 5.0000 mg | SUBCUTANEOUS | 3 refills | Status: AC

## 2023-09-29 MED ORDER — CEPHALEXIN 500 MG PO CAPS: 500.0000 mg | ORAL_CAPSULE | Freq: Two times a day (BID) | ORAL | Status: DC

## 2023-09-29 MED ORDER — SULFAMETHOXAZOLE-TRIMETHOPRIM 800-160 MG PO TABS: 1.0000 | ORAL_TABLET | Freq: Every day | ORAL | Status: AC

## 2023-09-29 NOTE — Patient Instructions (Signed)
 Ok to increase the Mounjaro  to 5 mg weekly  Please continue all other medications as before, and refills have been done if requested.  Please have the pharmacy call with any other refills you may need.  Please keep your appointments with your specialists as you may have planned  Your lab work was excellent today  Please make an Appointment to return in 6 months, or sooner if needed, also with Lab Appointment for testing done 3-5 days before at the FIRST FLOOR Lab (so this is for TWO appointments - please see the scheduling desk as you leave)

## 2023-09-29 NOTE — Assessment & Plan Note (Signed)
 Lab Results  Component Value Date   VITAMINB12 380 03/27/2023   Stable, cont oral replacement - b12 1000 mcg qd

## 2023-09-29 NOTE — Assessment & Plan Note (Signed)
 Lab Results  Component Value Date   HGBA1C 6.1 09/24/2023   With uncontrolled obesity, pt to increase the mounjaro  to 5 mg weekly

## 2023-09-29 NOTE — Assessment & Plan Note (Signed)
 Last vitamin D  Lab Results  Component Value Date   VD25OH 70.13 09/24/2023   Stable, cont oral replacement

## 2023-09-29 NOTE — Addendum Note (Signed)
 Addended by: NORLEEN LYNWOOD ORN on: 09/29/2023 01:49 PM   Modules accepted: Orders

## 2023-09-29 NOTE — Assessment & Plan Note (Signed)
 Lab Results  Component Value Date   LDLCALC 35 09/24/2023   Stable, pt to continue current statin crestor  10 mg qd
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
## 2023-09-29 NOTE — Telephone Encounter (Signed)
 Pt requesting lab orders be put in for his cpe on 2.5.26 so he can have the blood draw a few days early.

## 2023-10-02 DIAGNOSIS — E1169 Type 2 diabetes mellitus with other specified complication: Secondary | ICD-10-CM | POA: Diagnosis not present

## 2023-10-02 DIAGNOSIS — E1165 Type 2 diabetes mellitus with hyperglycemia: Secondary | ICD-10-CM | POA: Diagnosis not present

## 2023-10-26 NOTE — Progress Notes (Unsigned)
  Electrophysiology Office Note:   Date:  10/27/2023  ID:  Eric Santos, DOB [birth year], MRN [id number]  Primary Cardiologist: Maude Emmer, MD Primary Heart Failure: None Electrophysiologist: Skyelar Halliday Gladis Norton, MD      History of Present Illness:   Eric Santos is a 78 y.o. male with h/o atrial fibrillation, coronary artery disease, hypertension, sleep apnea seen today for routine electrophysiology followup.   Since last being seen in our clinic the patient reports continued episodes of atrial fibrillation.  He feels somewhat fatigued and short of breath.  His heart rates are in the 120s at times.  He can exercise with heart rates in the 120s and then when he rests his heart rates are in the 70s.  He was taken off of metoprolol  due to bradycardia.  he denies chest pain, palpitations, dyspnea, PND, orthopnea, nausea, vomiting, dizziness, syncope, edema, weight gain, or early satiety.   Review of systems complete and found to be negative unless listed in HPI.   EP Information / Studies Reviewed:    EKG is ordered today. Personal review as below.  EKG Interpretation Date/Time:  Monday October 27 2023 11:40:57 EDT Ventricular Rate:  63 PR Interval:  280 QRS Duration:  118 QT Interval:  442 QTC Calculation: 452 R Axis:   -14  Text Interpretation: Sinus rhythm with 1st degree A-V block Non-specific intra-ventricular conduction delay Nonspecific T wave abnormality Abnormal ECG When compared with ECG of 08-May-2022 10:31, No significant change since last tracing Confirmed by Christiana Gurevich ([id number]) on 10/27/2023 11:50:37 AM     Risk Assessment/Calculations:    CHA2DS2-VASc Score = 3   This indicates a 3.2% annual risk of stroke. The patient's score is based upon: CHF History: 0 HTN History: 0 Diabetes History: 1 Stroke History: 0 Vascular Disease History: 0 Age Score: 2 Gender Score: 0            Physical Exam:   VS:  BP 112/64   Pulse 63   Ht 5' 6 (1.676 m)   Wt 253  lb 9.6 oz (115 kg)   SpO2 97%   BMI 40.93 kg/m    Wt Readings from Last 3 Encounters:  10/27/23 253 lb 9.6 oz (115 kg)  09/29/23 250 lb 3.2 oz (113.5 kg)  09/16/23 252 lb (114.3 kg)     GEN: Well nourished, well developed in no acute distress NECK: No JVD; No carotid bruits CARDIAC: Regular rate and rhythm, no murmurs, rubs, gallops RESPIRATORY:  Clear to auscultation without rales, wheezing or rhonchi  ABDOMEN: Soft, non-tender, non-distended EXTREMITIES:  No edema; No deformity   ASSESSMENT AND PLAN:    1.  Persistent atrial fibrillation/flutter: On flecainide  and diltiazem .  He is unfortunately had more episodes of atrial fibrillation.  He would prefer an alternative rhythm control strategy.  I have talked him about ablation, dofetilide, amiodarone.  He would like to think about this further and Jayvien Rowlette let us  know.  2.  Secondary hypercoagulable state: On Eliquis   3.  Coronary artery disease: Noninvasive by catheterization in 2019.  Plan per primary cardiology.  4.  Hypertension: Well-controlled  5.  Obstructive sleep apnea: CPAP compliance encouraged  Follow up with Dr. Norton in 12 months  Signed, Lafe Clerk Gladis Norton, MD

## 2023-10-27 ENCOUNTER — Encounter: Payer: Self-pay | Admitting: Cardiology

## 2023-10-27 ENCOUNTER — Ambulatory Visit: Attending: Cardiology | Admitting: Cardiology

## 2023-10-27 VITALS — BP 112/64 | HR 63 | Ht 66.0 in | Wt 253.6 lb

## 2023-10-27 DIAGNOSIS — D6869 Other thrombophilia: Secondary | ICD-10-CM | POA: Diagnosis not present

## 2023-10-27 DIAGNOSIS — I4819 Other persistent atrial fibrillation: Secondary | ICD-10-CM

## 2023-10-27 DIAGNOSIS — I483 Typical atrial flutter: Secondary | ICD-10-CM

## 2023-10-27 MED ORDER — METOPROLOL TARTRATE 25 MG PO TABS: 25.0000 mg | ORAL_TABLET | Freq: Two times a day (BID) | ORAL | 1 refills | Status: AC | PRN

## 2023-10-27 NOTE — Addendum Note (Signed)
 Addended by: GRETEL MAEOLA CROME on: 10/27/2023 12:09 PM   Modules accepted: Orders

## 2023-10-27 NOTE — Patient Instructions (Signed)
 Medication Instructions:  Your physician recommends that you continue on your current medications as directed. Please refer to the Current Medication list given to you today.  *If you need a refill on your cardiac medications before your next appointment, please call your pharmacy*  Lab Work: None ordered  If you have any lab test that is abnormal or we need to change your treatment, we will call you to review the results.  Testing/Procedures: None ordered  Follow-Up: At [facility], you and your health needs are our priority.  As part of our continuing mission to provide you with exceptional heart care, our providers are all part of one team.  This team includes your primary Cardiologist (physician) and Advanced Practice Providers or APPs (Physician Assistants and Nurse Practitioners) who all work together to provide you with the care you need, when you need it.  Your next appointment:   1 year(s)  Provider:   Soyla Norton, MD     Thank you for choosing [facility]!!   Maeola Domino, RN [phone]   Other Instructions  Amiodarone Tablets What is this medication? AMIODARONE (a MEE oh da rone) prevents and treats a fast or irregular heartbeat (arrhythmia). It works by slowing down overactive electric signals in the heart, which stabilizes your heart rhythm. It belongs to a group of medications called antiarrhythmics. This medicine may be used for other purposes; ask your health care provider or pharmacist if you have questions. COMMON BRAND NAME(S): Cordarone, Pacerone What should I tell my care team before I take this medication? They need to know if you have any of these conditions: Liver disease Lung disease Other heart problems Thyroid  disease An unusual or allergic reaction to amiodarone, iodine, other medications, foods, dyes, or preservatives Pregnant or trying to get pregnant Breast-feeding How should I use this medication? Take this medication by  mouth with water. Take it as directed on the prescription label at the same time every day. You can take it with or without food. You should always take it the same way. Keep taking it unless your care team tells you to stop. A special MedGuide will be given to you by the pharmacist with each prescription and refill. Be sure to read this information carefully each time. Talk to your care team about the use of this medication in children. Special care may be needed. Overdosage: If you think you have taken too much of this medicine contact a poison control center or emergency room at once. NOTE: This medicine is only for you. Do not share this medicine with others. What if I miss a dose? If you miss a dose, take it as soon as you can. If it is almost time for your next dose, take only that dose. Do not take double or extra doses. What may interact with this medication? Do not take this medication with any of the following: Abarelix Apomorphine Arsenic trioxide Certain antibiotics, such as erythromycin, gemifloxacin, levofloxacin, or pentamidine Certain medications for depression, such as amoxapine or tricyclic antidepressants Certain medications for fungal infections, such as fluconazole, itraconazole, ketoconazole, posaconazole, or voriconazole Certain medications for irregular heartbeat, such as disopyramide, dronedarone, ibutilide, propafenone, or sotalol Certain medications for malaria, such as chloroquine or halofantrine Cisapride Droperidol Haloperidol [place] Maprotiline Methadone Phenothiazines, such as chlorpromazine, mesoridazine, or thioridazine Pimozide Ranolazine Red yeast rice Vardenafil This medication may also interact with the following: Antivirals for HIV Certain medications for blood pressure, heart disease, irregular heartbeat Certain medications for cholesterol, such as  atorvastatin, cerivastatin, lovastatin , or simvastatin  Certain medications for hepatitis C, such  as sofosbuvir and ledipasvir; sofosbuvir Certain medications for seizures, such as phenytoin Certain medications for thyroid  problems Certain medications that prevent or treat blood clots, such as warfarin Cholestyramine Cimetidine Clopidogrel Cyclosporine Dextromethorphan Diuretics Dofetilide Fentanyl  General anesthetics Grapefruit juice Lidocaine  Loratadine  Methotrexate Other medications that cause heart rhythm changes Procainamide Quinidine Rifabutin, rifampin, or rifapentine St. John's Wort Trazodone  Ziprasidone This list may not describe all possible interactions. Give your health care provider a list of all the medicines, herbs, non-prescription drugs, or dietary supplements you use. Also tell them if you smoke, drink alcohol, or use illegal drugs. Some items may interact with your medicine. What should I watch for while using this medication? Your condition will be monitored closely when you first begin therapy. This medication is often started in a hospital or other monitored health care setting. Once you are on maintenance therapy, visit your care team for regular checks on your progress. Because your condition and use of this medication carry some risk, it is a good idea to carry an identification card, necklace, or bracelet with details of your condition, medications, and care team. This medication may affect your coordination, reaction time, or judgment. Do not drive or operate machinery until you know how this medication affects you. Sit up or stand slowly to reduce the risk of dizzy or fainting spells. Drinking alcohol with this medication can increase the risk of these side effects. This medication can make you more sensitive to the sun. Keep out of the sun. If you cannot avoid being in the sun, wear protective clothing and sunscreen. Do not use sun lamps, tanning beds, or tanning booths. You should have regular eye exams before and during treatment. Call your care team if  you have blurred vision, see halos, or your eyes become sensitive to light. Your eyes may get dry. It may be helpful to use a lubricating eye solution or artificial tears solution. If you are going to have surgery or a procedure that requires contrast dyes, tell your care team that you are taking this medication. What side effects may I notice from receiving this medication? Side effects that you should report to your care team as soon as possible: Allergic reactions--skin rash, itching, hives, swelling of the face, lips, tongue, or throat Bluish-gray skin Change in vision such as blurry vision, seeing halos around lights, vision loss Heart failure--shortness of breath, swelling of the ankles, feet, or hands, sudden weight gain, unusual weakness or fatigue Heart rhythm changes--fast or irregular heartbeat, dizziness, feeling faint or lightheaded, chest pain, trouble breathing High thyroid  levels (hyperthyroidism)--fast or irregular heartbeat, weight loss, excessive sweating or sensitivity to heat, tremors or shaking, anxiety, nervousness, irregular menstrual cycle or spotting Liver injury--right upper belly pain, loss of appetite, nausea, light-colored stool, dark yellow or brown urine, yellowing skin or eyes, unusual weakness or fatigue Low thyroid  levels (hypothyroidism)--unusual weakness or fatigue, sensitivity to cold, constipation, hair loss, dry skin, weight gain, feelings of depression Lung injury--shortness of breath or trouble breathing, cough, spitting up blood, chest pain, fever Pain, tingling, or numbness in the hands or feet, muscle weakness, trouble walking, loss of balance or coordination Side effects that usually do not require medical attention (report to your care team if they continue or are bothersome): Nausea Vomiting This list may not describe all possible side effects. Call your doctor for medical advice about side effects. You may report side effects to FDA at  1-800-FDA-1088. Where  should I keep my medication? Keep out of the reach of children and pets. Store at room temperature between 20 and 25 degrees C (68 and 77 degrees F). Protect from light. Keep container tightly closed. Throw away any unused medication after the expiration date. NOTE: This sheet is a summary. It may not cover all possible information. If you have questions about this medicine, talk to your doctor, pharmacist, or health care provider.  2024 Elsevier/Gold Standard (2021-09-07 00:00:00)    Dofetilide Capsules What is this medication? DOFETILIDE (doe FET il ide) treats a fast or irregular heartbeat (arrhythmia). It works by slowing down overactive electric signals in the heart, which stabilizes your heart rhythm. It belongs to a group of medications called antiarrhythmics. This medicine may be used for other purposes; ask your health care provider or pharmacist if you have questions. COMMON BRAND NAME(S): Tikosyn What should I tell my care team before I take this medication? They need to know if you have any of these conditions: Heart disease History of irregular heartbeat History of low levels of potassium or magnesium  in the blood Kidney disease Liver disease An unusual or allergic reaction to dofetilide, other medications, foods, dyes, or preservatives Pregnant or trying to get pregnant Breast-feeding How should I use this medication? Take this medication by mouth with a glass of water. Follow the directions on the prescription label. Do not take with grapefruit juice. You can take it with or without food. If it upsets your stomach, take it with food. Take your medication at regular intervals. Do not take it more often than directed. Do not stop taking except on your care team's advice. A special MedGuide will be given to you by the pharmacist with each prescription and refill. Be sure to read this information carefully each time. Talk to your care team about the use of  this medication in children. Special care may be needed. Overdosage: If you think you have taken too much of this medicine contact a poison control center or emergency room at once. NOTE: This medicine is only for you. Do not share this medicine with others. What if I miss a dose? If you miss a dose, skip it. Take your next dose at the normal time. Do not take extra or 2 doses at the same time to make up for the missed dose. What may interact with this medication? Do not take this medication with any of the following: Certain antivirals for HIV or hepatitis, such as bictegravir, delavirdine, dolutegravir, saquinavir Certain diuretics, such as chlorothiazide, chlorthalidone, hydrochlorothiazide, metolazone Certain medications for irregular heartbeat, such as dronedarone, lidocaine , mexiletine, procainamide, quinidine Certain medications for fungal infections, such as fexinidazole, fluconazole, isavuconazonium, itraconazole, ketoconazole, posaconazole Cimetidine Cisapride Fedratinib Fingolimod Megestrol Other medications that cause heart rhythm changes Pimozide Prochlorperazine Quinine Tafenoquine Thioridazine Trilaciclib Trimethoprim  Verapamil  Ziprasidone This medication may also interact with the following: Amiloride Cannabinoids Certain antibiotics, such as erythromycin, clarithromycin, ciprofloxacin , levofloxacin Certain medications for depression, anxiety, or other mental health conditions Diltiazem  Grapefruit juice Metformin  Triamterene Zafirlukast Other medications may affect the way this medication works. Talk with your care team about all the medications you take. They may suggest changes to your treatment plan to lower the risk of side effects and to make sure your medications work as intended. This list may not describe all possible interactions. Give your health care provider a list of all the medicines, herbs, non-prescription drugs, or dietary supplements you use.  Also tell them if you smoke, drink alcohol, or use  illegal drugs. Some items may interact with your medicine. What should I watch for while using this medication? Your condition will be monitored carefully while you are receiving this medication. What side effects may I notice from receiving this medication? Side effects that you should report to your care team as soon as possible: Allergic reactions--skin rash, itching, hives, swelling of the face, lips, tongue, or throat Chest pain Heart rhythm changes--fast or irregular heartbeat, dizziness, feeling faint or lightheaded, chest pain, trouble breathing Side effects that usually do not require medical attention (report to your care team if they continue or are bothersome): Dizziness Headache Nausea Stomach pain Trouble sleeping This list may not describe all possible side effects. Call your doctor for medical advice about side effects. You may report side effects to FDA at 1-800-FDA-1088. Where should I keep my medication? Keep out of the reach of children. Store at room temperature between 15 and 30 degrees C (59 and 86 degrees F). Throw away any unused medication after the expiration date. NOTE: This sheet is a summary. It may not cover all possible information. If you have questions about this medicine, talk to your doctor, pharmacist, or health care provider.  2024 Elsevier/Gold Standard (2022-10-02 00:00:00)     Cardiac Ablation Cardiac ablation is a procedure to destroy (ablate) heart tissue that is sending bad signals. These bad signals cause the heart to beat very fast or in a way that is not normal. Destroying some tissues can help make the heart rhythm normal. Tell your doctor about: Any allergies you have. All medicines you are taking. These include vitamins, herbs, eye drops, creams, and over-the-counter medicines. Any problems you or family members have had with anesthesia. Any bleeding problems you have. Any surgeries you  have had. Any medical conditions you have. Whether you are pregnant or may be pregnant. What are the risks? Your doctor will talk with you about risks. These may include: Infection. Bruising and bleeding. Stroke or blood clots. Damage to nearby areas of your body. Allergies to medicines or dyes. Needing a pacemaker if the heart gets damaged. A pacemaker helps the heart beat normally. The procedure not working. What happens before the procedure? Medicines Ask your doctor about changing or stopping: Your normal medicines. Vitamins, herbs, and supplements. Over-the-counter medicines. Do not take aspirin  or ibuprofen unless you are told to. General instructions Follow instructions from your doctor about what you may eat and drink. If you will be going home right after the procedure, plan to have a responsible adult: Take you home from the hospital or clinic. You will not be allowed to drive. Care for you for the time you are told. Ask your doctor what steps will be taken to prevent the spread of germs. What happens during the procedure?  An IV tube will be put into one of your veins. You may be given: A sedative. This helps you relax. Anesthesia. This will: Numb certain areas of your body. The skin on your neck or groin will be numbed. A cut (incision) will be made in your neck or groin. A needle will be put through the cut and into a large vein. The small, thin tube (catheter) will be put into the needle. The tube will be moved to your heart. A type of X-ray (fluoroscopy) will be used to help guide the tube. It will also show constant images of the heart on a screen. Dye may be put through the tube. This helps your doctor see your heart. An  electric current will be sent from the tube to destroy heart tissue in certain areas. The tube will be taken out. Pressure will be held on your cut. This helps stop bleeding. A bandage (dressing) will be put over your cut. The procedure may  vary among doctors and hospitals. What happens after the procedure? You will be monitored until you leave the hospital or clinic. This includes checking your blood pressure, heart rate and rhythm, breathing rate, and blood oxygen level. Your cut will be checked for bleeding. You will need to lie still for a few hours. If your groin was used, you will need to keep your leg straight for a few hours after the small, thin tube is removed. This information is not intended to replace advice given to you by your health care provider. Make sure you discuss any questions you have with your health care provider. Document Revised: 08/07/2021 Document Reviewed: 08/07/2021 Elsevier Patient Education  2024 [employer].

## 2023-11-04 ENCOUNTER — Ambulatory Visit (HOSPITAL_BASED_OUTPATIENT_CLINIC_OR_DEPARTMENT_OTHER)
Admission: RE | Admit: 2023-11-04 | Discharge: 2023-11-04 | Disposition: A | Discharge status: Home / Self Care | Source: Ambulatory Visit | Attending: Family Medicine | Admitting: Family Medicine

## 2023-11-04 ENCOUNTER — Ambulatory Visit (HOSPITAL_BASED_OUTPATIENT_CLINIC_OR_DEPARTMENT_OTHER): Payer: Self-pay | Admitting: Family Medicine

## 2023-11-04 ENCOUNTER — Encounter (HOSPITAL_BASED_OUTPATIENT_CLINIC_OR_DEPARTMENT_OTHER): Payer: Self-pay

## 2023-11-04 ENCOUNTER — Ambulatory Visit (INDEPENDENT_AMBULATORY_CARE_PROVIDER_SITE_OTHER): Admit: 2023-11-04 | Discharge: 2023-11-04 | Disposition: A | Discharge status: Home / Self Care | Admitting: Radiology

## 2023-11-04 VITALS — BP 107/72 | HR 63 | Temp 98.1°F | Resp 20

## 2023-11-04 DIAGNOSIS — R1012 Left upper quadrant pain: Secondary | ICD-10-CM

## 2023-11-04 DIAGNOSIS — R1032 Left lower quadrant pain: Secondary | ICD-10-CM

## 2023-11-04 DIAGNOSIS — Z87442 Personal history of urinary calculi: Secondary | ICD-10-CM | POA: Diagnosis not present

## 2023-11-04 DIAGNOSIS — K5900 Constipation, unspecified: Secondary | ICD-10-CM

## 2023-11-04 DIAGNOSIS — R109 Unspecified abdominal pain: Secondary | ICD-10-CM | POA: Diagnosis not present

## 2023-11-04 DIAGNOSIS — K449 Diaphragmatic hernia without obstruction or gangrene: Secondary | ICD-10-CM | POA: Diagnosis not present

## 2023-11-04 LAB — POCT URINE DIPSTICK
Bilirubin, UA: NEGATIVE
Blood, UA: NEGATIVE
Glucose, UA: NEGATIVE mg/dL
Ketones, POC UA: NEGATIVE mg/dL
Leukocytes, UA: NEGATIVE
Nitrite, UA: NEGATIVE
Protein Ur, POC: NEGATIVE mg/dL
Spec Grav, UA: 1.025 (ref 1.010–1.025)
Urobilinogen, UA: 1 U/dL
pH, UA: 6 (ref 5.0–8.0)

## 2023-11-04 NOTE — Discharge Instructions (Signed)
 Abdominal pain with left flank pain and constipation: Patient does not believe that he is constipated because he has bowel movements daily or multiple bowel movements daily.  However his x-ray shows a moderate stool burden and some bowel gas.  Patient needs to get empty of stool.  Patient provided magnesium  citrate handout as a bowel cleanse.  Patient encouraged to try the constipation recipe once he gets empty of stool to try to keep it empty of stool.  If symptoms persist needs a CT scan to rule in or out kidney stones.  Urinalysis was completely clear with no signs of infection and no blood.  Encouraged to continue 3 to 4 L of fluid every day (64 to 96 ounces of fluids every day).  Follow-up if symptoms do not improve, worsen or new symptoms occur.
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
## 2023-11-04 NOTE — Progress Notes (Signed)
 Negative for acute findings or kidney stones.  Moderate stool burden and gas.  Encouraged to get empty of stool.  Patient updated on results during his visit.

## 2023-11-04 NOTE — ED Triage Notes (Addendum)
 Pt states he has been having left lower back/flank pain that radiates into his left groin. Pt reports a hx of kidney stones and has been riding tractors all week. He thought he moved a stone so he increased fluid intake but has not passed a stone. Pt states he is having frequency and urgency. Denies dysuria or hematuria. He is currently taking cephalexin  and bactrim  ds for MRSA.

## 2023-11-10 ENCOUNTER — Telehealth: Payer: Self-pay | Admitting: Cardiology

## 2023-11-10 DIAGNOSIS — I480 Paroxysmal atrial fibrillation: Secondary | ICD-10-CM

## 2023-11-10 NOTE — Telephone Encounter (Signed)
  Patient's wife calling, she said patient is ready to schedule ablation. She said these are the dates they are available September 22-26 or October 20-24

## 2023-11-11 ENCOUNTER — Ambulatory Visit: Admitting: Internal Medicine

## 2023-11-12 NOTE — Telephone Encounter (Signed)
 Pt has decided he would prefer to have Ablation instead of taking the medication.  He has been scheduled on 11/6 at 730 am for Afib/flutter Ablation with Dr. Inocencio.   He will go to [facility] office for pre-procedure labs between 10/13-24...  I will send Instruction letter via MyChart and mail him a copy - address verified...  I will message Dr. Inocencio to clarify if CT is needed or not. Pt is aware that he may need a  CT and I will call him if he does.

## 2023-11-13 ENCOUNTER — Telehealth: Payer: Self-pay

## 2023-11-13 ENCOUNTER — Encounter: Payer: Self-pay | Admitting: Internal Medicine

## 2023-11-13 ENCOUNTER — Ambulatory Visit: Admitting: Internal Medicine

## 2023-11-13 ENCOUNTER — Other Ambulatory Visit: Payer: Self-pay

## 2023-11-13 VITALS — BP 125/69 | HR 67 | Temp 97.5°F | Resp 16 | Wt 252.2 lb

## 2023-11-13 DIAGNOSIS — T8450XD Infection and inflammatory reaction due to unspecified internal joint prosthesis, subsequent encounter: Secondary | ICD-10-CM

## 2023-11-13 MED ORDER — CEFADROXIL 500 MG PO CAPS
500.0000 mg | ORAL_CAPSULE | Freq: Every day | ORAL | 3 refills | Status: AC
Start: 2023-11-13 — End: 2024-11-07

## 2023-11-13 NOTE — Patient Instructions (Addendum)
 Will continue your current antibiotics of Bactrim  ds 1 tablet daily Or cephalexin  500 mg twice a day, there is a longer acting version in cefadroxil  500 daily --> I prescribe that today and you can stop cephalexin  and start taking cefadroxil     So both antibiotics bactrim  and cefadroxil  can be once a day    Labs today   See me in 6 months    You are doing very well

## 2023-11-13 NOTE — Telephone Encounter (Signed)
-----   Message from [facility] sent at 11/12/2023  3:51 PM EDT ----- Regarding: RE: CT No need for CT, thanks!! ----- Message ----- From: Dreama Earing, CMA Sent: 11/12/2023   1:39 PM EDT To: Will Gladis Norton, MD; Maeola LITTIE Domino, RN Subject: CT                                             Pt has decided he would prefer to have Ablation instead of taking the medication.  He has been scheduled on 11/6 at 730 am for Afib/flutter Ablation.  Will he need CT prior to procedure?  Thanks, Eliot Bencivenga

## 2023-11-13 NOTE — Progress Notes (Signed)
 Subjective:    Patient ID: Eric Santos, male    DOB: [birth year], 78 y.o.   MRN: [id number]  HPI Here for follow up of a chronic right knee prosthetic joint infection.   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 Liam in November 2020 due to infection and culture with CoNS, oxacillin sensitive, clinda, tetracycline and TMP/SMX resistant.  He was on daptomycin  for nearly 6 weeks but due to side effects was stopped a bit early.  He was then placed on Keflex  after that, initially 4 times a day then reduced to 1 gram twice a day.  He then later last year developed more leg swelling and noted instability of his prosthetic joint and sent to Dr. Domenick in [place] who performed a revision of the total knee on 01/05/20.    In addition to above, he has a history of an MRSA infection in 2008 with this joint and underwent two stage revision at that time and had remained on minocycline  chronically since that time with concern for chronic infection but developed a likely minocycline -induced sun rash in 2020.  He was then changed to Bactrim  and has been on that since that time and tolerating well.    09/16/23 [facility] f/u First visit with me Reviewed previous chart Lov with dr Efrain 01/07/23. Hx mrsa and CoNS right knee prosthetic joint infection continued on bactrim  and cephalexin   I reviewed 01/05/20 [facility] operative note -- 2 set of cultures sent and were negative Medial parapatellar arthrotomy was performed after obtaining joint fluid which was clear. This was sent for culture. Full-thickness medial and lateral synovectomy was obtained and sent for culture. I performed medial release. The knee was examined. There was no metal staining to suggest trunnionosis. The femoral and tibial components were well fixed. The medial collateral ligament was noted to be elongated. I examined the post and there was significant  polyethylene wear on the post. The polyethylene component was removed. The posterior synovium was excised. I trialed for stability with a 30 mm insert. The one we removed was 25 mm. I was satisfied with knee stability to varus and valgus stress in full extension and through the range of motion. He was able to flex to 90 and patellar tracking was neutral. I decided to accept this. The knee was irrigated with copious saline as well as Betadine and peroxide. The final polyethylene was inserted  The femoral/tibial stem were in good function and kept; only the polyethylene liner was exchanged  He has no complaint with the knee He is walking with cane He said that if something happens again to the knee the next surgical step would be above knee amputation    11/13/23 [facility] f/u See a&p for detail   Review of Systems  Constitutional:  Negative for fatigue.  Gastrointestinal:  Negative for diarrhea and nausea.  Skin:  Negative for rash.       Objective:   Physical Exam Eyes:     General: No scleral icterus. Pulmonary:     Effort: Pulmonary effort is normal.  Musculoskeletal:        General: Normal range of motion.  Neurological:     Mental Status: He is alert.  Psychiatric:        Mood and Affect: Mood normal.     Labs: Lab Results  Component Value Date   WBC 9.5 09/16/2023   HGB 14.4 09/16/2023  HCT 46.1 09/16/2023   MCV 90.6 09/16/2023   PLT 266 09/16/2023   Last metabolic panel Lab Results  Component Value Date   GLUCOSE 98 09/24/2023   NA 139 09/24/2023   K 4.4 09/24/2023   CL 103 09/24/2023   CO2 27 09/24/2023   BUN 17 09/24/2023   CREATININE 0.89 09/24/2023   GFR 83.10 09/24/2023   CALCIUM  9.5 09/24/2023   PROT 7.3 09/24/2023   ALBUMIN 4.3 09/24/2023   BILITOT 0.5 09/24/2023   ALKPHOS 66 09/24/2023   AST 17 09/24/2023   ALT 18 09/24/2023   ANIONGAP 10 03/04/2019   Lab Results  Component Value Date   CRP 15.4 (H) 09/16/2023   Lab Results   Component Value Date   ESRSEDRATE 17 09/16/2023         Assessment & Plan:    Right knee prosthetic joint recurrent infection 2008 mrsa s/p 2 stage revision. However remains on bactrim  ds 1 tablet suppression; sun sensitivity to minocycline  2020 recurrent infection CoNS (S cephalexin ; R bactrim /tetracycline) 01/2020 ?mechanical wear issue and underwent another rrevision -- cx negative; polyethylene liner exchange   09/16/23 query if bactrim  really needed, and that cephalexin  dose could be decrease to 500 mg bid dosing for suppression  Discussed trial off abx but we don't know if infection comes back -- he doesn't have good option if it does (at least for CoNS)  Plan to decrease cephalexin  and potentially getting off bactrim  at some point    -labs today -start cephalexin  500 bid -continue bactrim  -f/u 6 weeks   11/13/23 [facility] assessment We reviewed surgical timeline again 2008 mrsa right knee 2 stage revision, chronic mrsa suppression 2020 recurrent infection -- polyexchange; msse 2021 partial hardware exchange for ?hardware problem; cx negative  Can't tell exactly if mrsa/mssa remains. For mrsa almost 20 years abx but he doesn't feel good to stop trial   No side effect  -continue bactrim  ds daily -continue cephalexin  500 twice a day -labs today cbc, cmp, crp -f/u 6 months -exam stable swelling no tenderness/warmth today 11/13/23 (chronic)           Constance ONEIDA Passer, MD [facility] [phone]  pager   [phone] cell 11/13/2023, 10:32 AM

## 2023-11-14 ENCOUNTER — Ambulatory Visit: Payer: Self-pay | Admitting: Internal Medicine

## 2023-11-14 LAB — COMPLETE METABOLIC PANEL WITHOUT GFR
AG Ratio: 1.5 (calc) (ref 1.0–2.5)
ALT: 13 U/L (ref 9–46)
AST: 15 U/L (ref 10–35)
Albumin: 4.1 g/dL (ref 3.6–5.1)
Alkaline phosphatase (APISO): 70 U/L (ref 35–144)
BUN: 19 mg/dL (ref 7–25)
CO2: 28 mmol/L (ref 20–32)
Calcium: 9.9 mg/dL (ref 8.6–10.3)
Chloride: 104 mmol/L (ref 98–110)
Creat: 1.02 mg/dL (ref 0.70–1.28)
Globulin: 2.8 g/dL (ref 1.9–3.7)
Glucose, Bld: 81 mg/dL (ref 65–99)
Potassium: 4.9 mmol/L (ref 3.5–5.3)
Sodium: 140 mmol/L (ref 135–146)
Total Bilirubin: 0.4 mg/dL (ref 0.2–1.2)
Total Protein: 6.9 g/dL (ref 6.1–8.1)

## 2023-11-14 LAB — C-REACTIVE PROTEIN: CRP: 20.6 mg/L — ABNORMAL HIGH (ref <8.0)

## 2023-12-18 ENCOUNTER — Telehealth: Payer: Self-pay

## 2023-12-18 NOTE — Telephone Encounter (Signed)
 Spoke with patient's wife, Landon, to complete pre-procedure call.     Health status review:  Any new medical conditions, recent signs of acute illness or been started on antibiotics? No. Pt has a h/o recurrent infection of right knee prosthetic joint, managed by ID and taking antibiotics for suppression. Denies any active s/o infection.  Any recent hospitalizations or surgeries? No Any new medications started since pre-op visit? Yes; Cephalexin  twice daily changed to Cefadroxil  daily.   Follow all medication instructions prior to procedure or the procedure may be rescheduled:    Continue taking Eliquis  (Apixaban ) twice daily without missing any doses before procedure.  HOLD: Tirzepatide  (Mounjaro , Zepbound ) for 1 week prior to the procedure. Last dose on Tuesday, October 28. Essential chronic medications:  No medication should be continued, unless told otherwise. On the morning of your procedure DO NOT take any medication., including Eliquis  (Apixaban ).  Nothing to eat or drink after midnight prior to your procedure.  Pre-procedure testing scheduled: lab work on October 17 and CT not needed.   Confirmed patient is scheduled for Atrial Fibrillation Ablation, Atrial Flutter Ablation on Thursday, November 6 with Dr. Dr. Inocencio. Instructed patient to arrive at the Main Entrance A at [facility]: [address] [place], [place] [address] and check in at Admitting at 5:30 AM.  Advised of plan to go home the same day and will only stay overnight if medically necessary. You MUST have a responsible adult to drive you home and MUST be with you the first 24 hours after you arrive home or your procedure could be cancelled.  Informed a nurse will call a day before the procedure to confirm arrival time and ensure instructions are followed.  Patient's wife verbalized understanding to information provided and is agreeable to proceed with procedure.   Advised to contact RN Navigator at  [phone], to inform of any new medications started after call or concerns prior to procedure.

## 2023-12-19 LAB — BASIC METABOLIC PANEL WITH GFR
BUN/Creatinine Ratio: 19 (ref 10–24)
BUN: 18 mg/dL (ref 8–27)
CO2: 25 mmol/L (ref 20–29)
Calcium: 9.5 mg/dL (ref 8.6–10.2)
Chloride: 100 mmol/L (ref 96–106)
Creatinine, Ser: 0.95 mg/dL (ref 0.76–1.27)
Glucose: 86 mg/dL (ref 70–99)
Potassium: 5.1 mmol/L (ref 3.5–5.2)
Sodium: 139 mmol/L (ref 134–144)
eGFR: 83 mL/min/1.73 (ref >59)

## 2023-12-19 LAB — CBC
Hematocrit: 44.1 % (ref 37.5–51.0)
Hemoglobin: 14.3 g/dL (ref 13.0–17.7)
MCH: 29.1 pg (ref 26.6–33.0)
MCHC: 32.4 g/dL (ref 31.5–35.7)
MCV: 90 fL (ref 79–97)
Platelets: 330 x10E3/uL (ref 150–450)
RBC: 4.92 x10E6/uL (ref 4.14–5.80)
RDW: 13.2 % (ref 11.6–15.4)
WBC: 8.1 x10E3/uL (ref 3.4–10.8)

## 2023-12-31 DIAGNOSIS — E1165 Type 2 diabetes mellitus with hyperglycemia: Secondary | ICD-10-CM | POA: Diagnosis not present

## 2023-12-31 DIAGNOSIS — E1169 Type 2 diabetes mellitus with other specified complication: Secondary | ICD-10-CM | POA: Diagnosis not present

## 2024-01-05 ENCOUNTER — Encounter: Payer: Self-pay | Admitting: Emergency Medicine

## 2024-01-07 NOTE — Pre-Procedure Instructions (Signed)
 Instructed patient on the following items: Arrival time 0515 Nothing to eat or drink after midnight No meds AM of procedure Responsible person to drive you home and stay with you for 24 hrs  Have you missed any doses of anti-coagulant Elqiuis- takes twice a day, hasn't missed any doses.  Don't take dose morning of procedure

## 2024-01-07 NOTE — Anesthesia Preprocedure Evaluation (Signed)
 Anesthesia Evaluation  Patient identified by MRN, date of birth, ID band Patient awake    Reviewed: Allergy & Precautions, NPO status , Patient's Chart, lab work & pertinent test results  History of Anesthesia Complications Negative for: history of anesthetic complications  Airway Mallampati: II  TM Distance: >3 FB Neck ROM: Full    Dental  (+) Dental Advisory Given   Pulmonary sleep apnea and Continuous Positive Airway Pressure Ventilation    Pulmonary exam normal        Cardiovascular + CAD  Normal cardiovascular exam+ dysrhythmias Atrial Fibrillation and Supra Ventricular Tachycardia    '20 TTE - EF 50 to 55%. Mildly increased left ventricular hypertrophy. Left atrial size was mildly dilated. Trivial MR and AI. Mild aortic valve sclerosis without stenosis.      Neuro/Psych  Neuromuscular disease  negative psych ROS   GI/Hepatic Neg liver ROS, hiatal hernia,GERD  Controlled,,  Endo/Other  diabetes, Type 2   On GLP-1a   Renal/GU negative Renal ROS     Musculoskeletal  (+) Arthritis ,    Abdominal   Peds  Hematology  On eliquis     Anesthesia Other Findings   Reproductive/Obstetrics                              Anesthesia Physical Anesthesia Plan  ASA: 3  Anesthesia Plan: General   Post-op Pain Management: Tylenol  PO (pre-op)* and Minimal or no pain anticipated   Induction: Intravenous  PONV Risk Score and Plan: 2 and Treatment may vary due to age or medical condition, Ondansetron  and Dexamethasone   Airway Management Planned: Oral ETT  Additional Equipment: None  Intra-op Plan:   Post-operative Plan: Extubation in OR  Informed Consent: I have reviewed the patients History and Physical, chart, labs and discussed the procedure including the risks, benefits and alternatives for the proposed anesthesia with the patient or authorized representative who has indicated his/her  understanding and acceptance.     Dental advisory given  Plan Discussed with: CRNA and Anesthesiologist  Anesthesia Plan Comments:          Anesthesia Quick Evaluation

## 2024-01-08 ENCOUNTER — Encounter (HOSPITAL_COMMUNITY): Payer: Self-pay | Admitting: Cardiology

## 2024-01-08 ENCOUNTER — Ambulatory Visit (HOSPITAL_COMMUNITY): Payer: Self-pay | Admitting: Certified Registered"

## 2024-01-08 ENCOUNTER — Ambulatory Visit (HOSPITAL_COMMUNITY)
Admission: RE | Admit: 2024-01-08 | Discharge: 2024-01-08 | Disposition: A | Discharge status: Home / Self Care | Attending: Cardiology | Admitting: Cardiology

## 2024-01-08 ENCOUNTER — Other Ambulatory Visit: Payer: Self-pay

## 2024-01-08 ENCOUNTER — Encounter: Admission: RE | Payer: Self-pay

## 2024-01-08 ENCOUNTER — Encounter (HOSPITAL_COMMUNITY)
Admission: RE | Disposition: A | Discharge status: Home / Self Care | Payer: Self-pay | Source: Home / Self Care | Attending: Cardiology

## 2024-01-08 DIAGNOSIS — Z7985 Long-term (current) use of injectable non-insulin antidiabetic drugs: Secondary | ICD-10-CM | POA: Insufficient documentation

## 2024-01-08 DIAGNOSIS — G4730 Sleep apnea, unspecified: Secondary | ICD-10-CM | POA: Diagnosis not present

## 2024-01-08 DIAGNOSIS — I4892 Unspecified atrial flutter: Secondary | ICD-10-CM | POA: Diagnosis not present

## 2024-01-08 DIAGNOSIS — I4819 Other persistent atrial fibrillation: Secondary | ICD-10-CM

## 2024-01-08 DIAGNOSIS — I4891 Unspecified atrial fibrillation: Secondary | ICD-10-CM

## 2024-01-08 DIAGNOSIS — I2510 Atherosclerotic heart disease of native coronary artery without angina pectoris: Secondary | ICD-10-CM | POA: Diagnosis not present

## 2024-01-08 DIAGNOSIS — E119 Type 2 diabetes mellitus without complications: Secondary | ICD-10-CM | POA: Insufficient documentation

## 2024-01-08 DIAGNOSIS — K219 Gastro-esophageal reflux disease without esophagitis: Secondary | ICD-10-CM | POA: Insufficient documentation

## 2024-01-08 HISTORY — PX: ATRIAL FIBRILLATION ABLATION: EP1191

## 2024-01-08 HISTORY — PX: A-FLUTTER ABLATION: EP1230

## 2024-01-08 LAB — GLUCOSE, CAPILLARY
Glucose-Capillary: 112 mg/dL — ABNORMAL HIGH (ref 70–99)
Glucose-Capillary: 123 mg/dL — ABNORMAL HIGH (ref 70–99)

## 2024-01-08 SURGERY — ATRIAL FIBRILLATION ABLATION
Anesthesia: General

## 2024-01-08 MED ORDER — HEPARIN SODIUM (PORCINE) 1000 UNIT/ML IJ SOLN
INTRAMUSCULAR | Status: AC
  Filled 2024-01-08: qty 10

## 2024-01-08 MED ORDER — DEXAMETHASONE SOD PHOSPHATE PF 10 MG/ML IJ SOLN
INTRAMUSCULAR | Status: DC | PRN
  Administered 2024-01-08: 5 mg via INTRAVENOUS

## 2024-01-08 MED ORDER — PROPOFOL 10 MG/ML IV BOLUS
INTRAVENOUS | Status: DC | PRN
  Administered 2024-01-08: 120 mg via INTRAVENOUS

## 2024-01-08 MED ORDER — HEPARIN SODIUM (PORCINE) 1000 UNIT/ML IJ SOLN
INTRAMUSCULAR | Status: AC
  Filled 2024-01-08: qty 20

## 2024-01-08 MED ORDER — LIDOCAINE 2% (20 MG/ML) 5 ML SYRINGE
INTRAMUSCULAR | Status: DC | PRN
  Administered 2024-01-08: 60 mg via INTRAVENOUS

## 2024-01-08 MED ORDER — SUGAMMADEX SODIUM 200 MG/2ML IV SOLN
INTRAVENOUS | Status: DC | PRN
  Administered 2024-01-08: 250 mg via INTRAVENOUS

## 2024-01-08 MED ORDER — ACETAMINOPHEN 325 MG PO TABS: 650.0000 mg | ORAL_TABLET | ORAL | Status: DC | PRN

## 2024-01-08 MED ORDER — FENTANYL CITRATE (PF) 100 MCG/2ML IJ SOLN
INTRAMUSCULAR | Status: AC
  Filled 2024-01-08: qty 2

## 2024-01-08 MED ORDER — ACETAMINOPHEN 500 MG PO TABS
1000.0000 mg | ORAL_TABLET | Freq: Once | ORAL | Status: AC
  Administered 2024-01-08: 1000 mg via ORAL
  Filled 2024-01-08: qty 2

## 2024-01-08 MED ORDER — HEPARIN SODIUM (PORCINE) 1000 UNIT/ML IJ SOLN
INTRAMUSCULAR | Status: DC | PRN
  Administered 2024-01-08: 1000 [IU] via INTRAVENOUS

## 2024-01-08 MED ORDER — FENTANYL CITRATE (PF) 100 MCG/2ML IJ SOLN
INTRAMUSCULAR | Status: DC | PRN
  Administered 2024-01-08 (×2): 50 ug via INTRAVENOUS

## 2024-01-08 MED ORDER — ROCURONIUM BROMIDE 10 MG/ML (PF) SYRINGE
PREFILLED_SYRINGE | INTRAVENOUS | Status: DC | PRN
  Administered 2024-01-08 (×2): 20 mg via INTRAVENOUS
  Administered 2024-01-08: 50 mg via INTRAVENOUS

## 2024-01-08 MED ORDER — PHENYLEPHRINE HCL-NACL 20-0.9 MG/250ML-% IV SOLN
INTRAVENOUS | Status: DC | PRN
  Administered 2024-01-08: 15 ug/min via INTRAVENOUS

## 2024-01-08 MED ORDER — ATROPINE SULFATE 1 MG/10ML IJ SOSY
PREFILLED_SYRINGE | INTRAMUSCULAR | Status: AC
  Filled 2024-01-08: qty 10

## 2024-01-08 MED ORDER — HEPARIN (PORCINE) IN NACL 1000-0.9 UT/500ML-% IV SOLN
INTRAVENOUS | Status: DC | PRN
  Administered 2024-01-08 (×2): 500 mL

## 2024-01-08 MED ORDER — PROTAMINE SULFATE 10 MG/ML IV SOLN
INTRAVENOUS | Status: DC | PRN
  Administered 2024-01-08: 40 mg via INTRAVENOUS

## 2024-01-08 MED ORDER — ATROPINE SULFATE 1 MG/10ML IJ SOSY
PREFILLED_SYRINGE | INTRAMUSCULAR | Status: DC | PRN
Start: 2024-01-08 — End: 2024-01-08
  Administered 2024-01-08: 1 mg via INTRAVENOUS

## 2024-01-08 MED ORDER — HEPARIN SODIUM (PORCINE) 1000 UNIT/ML IJ SOLN
INTRAMUSCULAR | Status: DC | PRN
Start: 2024-01-08 — End: 2024-01-08
  Administered 2024-01-08: 7000 [IU] via INTRAVENOUS
  Administered 2024-01-08: 2000 [IU] via INTRAVENOUS
  Administered 2024-01-08: 16000 [IU] via INTRAVENOUS

## 2024-01-08 MED ORDER — ONDANSETRON HCL 4 MG/2ML IJ SOLN: 4.0000 mg | Freq: Four times a day (QID) | INTRAMUSCULAR | Status: DC | PRN

## 2024-01-08 MED ORDER — SODIUM CHLORIDE 0.9 % IV SOLN: INTRAVENOUS | Status: DC

## 2024-01-08 MED ORDER — ONDANSETRON HCL 4 MG/2ML IJ SOLN
INTRAMUSCULAR | Status: DC | PRN
  Administered 2024-01-08: 4 mg via INTRAVENOUS

## 2024-01-08 NOTE — Anesthesia Postprocedure Evaluation (Signed)
 Anesthesia Post Note  Patient: Eric Santos  Procedure(s) Performed: ATRIAL FIBRILLATION ABLATION A-FLUTTER ABLATION     Patient location during evaluation: PACU Anesthesia Type: General Level of consciousness: awake and alert Pain management: pain level controlled Vital Signs Assessment: post-procedure vital signs reviewed and stable Respiratory status: spontaneous breathing, nonlabored ventilation and respiratory function stable Cardiovascular status: stable and blood pressure returned to baseline Anesthetic complications: no   There were no known notable events for this encounter.  Last Vitals:  Vitals:   01/08/24 0915 01/08/24 0930  BP: 107/75 102/71  Pulse: 80 76  Resp: (!) 22 (!) 8  Temp:    SpO2: 95% 94%    Last Pain:  Vitals:   01/08/24 1017  TempSrc:   PainSc: 0-No pain                 Debby FORBES Like

## 2024-01-08 NOTE — Transfer of Care (Signed)
 Immediate Anesthesia Transfer of Care Note  Patient: Eric Santos  Procedure(s) Performed: ATRIAL FIBRILLATION ABLATION A-FLUTTER ABLATION  Patient Location: Cath Lab  Anesthesia Type:General  Level of Consciousness: awake, alert , oriented, and patient cooperative  Airway & Oxygen Therapy: Patient Spontanous Breathing and Patient connected to face mask oxygen  Post-op Assessment: Report given to RN and Post -op Vital signs reviewed and stable  Post vital signs: Reviewed and stable  Last Vitals:  Vitals Value Taken Time  BP 111/76 01/08/24 09:13  Temp    Pulse 80 01/08/24 09:15  Resp 22 01/08/24 09:15  SpO2 95 % 01/08/24 09:15  Vitals shown include unfiled device data.  Last Pain:  Vitals:   01/08/24 0603  PainSc: 0-No pain         Complications: There were no known notable events for this encounter.

## 2024-01-08 NOTE — Discharge Instructions (Signed)

## 2024-01-08 NOTE — H&P (Signed)
  Electrophysiology Office Note:   Date:  01/08/2024  ID:  Eric Santos, DOB December 10, 1945, MRN [id number]  Primary Cardiologist: Maude Emmer, MD Primary Heart Failure: None Electrophysiologist: Kerly Rigsbee Gladis Norton, MD      History of Present Illness:   Eric Santos is a 78 y.o. male with h/o atrial fibrillation, coronary artery disease, hypertension, sleep apnea seen today for routine electrophysiology followup.   Today, denies symptoms of palpitations, chest pain, dyspnea, orthopnea, PND, lower extremity edema, claudication, dizziness, presyncope, syncope, bleeding, or neurologic sequela. The patient is tolerating medications without difficulties. Plan ablation today.   EP Information / Studies Reviewed:    EKG is ordered today. Personal review as below.        Risk Assessment/Calculations:    CHA2DS2-VASc Score = 3   This indicates a 3.2% annual risk of stroke. The patient's score is based upon: CHF History: 0 HTN History: 0 Diabetes History: 1 Stroke History: 0 Vascular Disease History: 0 Age Score: 2 Gender Score: 0            Physical Exam:   VS:  BP 134/80   Pulse 76   Temp 97.9 F (36.6 C)   Resp 16   Ht 5' 6 (1.676 m)   Wt 113.4 kg   SpO2 95%   BMI 40.35 kg/m    Wt Readings from Last 3 Encounters:  01/08/24 113.4 kg  11/13/23 114.4 kg  10/27/23 115 kg    GEN: Well nourished, well developed in no acute distress NECK: No JVD; No carotid bruits CARDIAC: Regular rate and rhythm, no murmurs, rubs, gallops RESPIRATORY:  Clear to auscultation without rales, wheezing or rhonchi  ABDOMEN: Soft, non-tender, non-distended EXTREMITIES:  No edema; No deformity    ASSESSMENT AND PLAN:    1.  Persistent atrial fibrillation/flutter: Eric Santos has presented today for surgery, with the diagnosis of AF.  The various methods of treatment have been discussed with the patient and family. After consideration of risks, benefits and other options for treatment,  the patient has consented to  Procedure(s): Catheter ablation as a surgical intervention .  Risks include but not limited to complete heart block, stroke, esophageal damage, nerve damage, bleeding, vascular damage, tamponade, perforation, MI, and death. The patient's history has been reviewed, patient examined, no change in status, stable for surgery.  I have reviewed the patient's chart and labs.  Questions were answered to the patient's satisfaction.    Maudene Stotler Norton, MD 01/08/2024 7:11 AM

## 2024-01-08 NOTE — Anesthesia Procedure Notes (Signed)
 Procedure Name: Intubation Date/Time: 01/08/2024 7:51 AM  Performed by: Claudene Arlin LABOR, CRNAPre-anesthesia Checklist: Patient identified, Emergency Drugs available, Suction available and Patient being monitored Patient Re-evaluated:Patient Re-evaluated prior to induction Oxygen Delivery Method: Circle system utilized Preoxygenation: Pre-oxygenation with 100% oxygen Induction Type: IV induction Ventilation: Mask ventilation without difficulty and Oral airway inserted - appropriate to patient size Laryngoscope Size: Cleotilde and 2 Grade View: Grade III Tube type: Oral Tube size: 7.5 mm Number of attempts: 2 Airway Equipment and Method: Stylet and Oral airway Placement Confirmation: ETT inserted through vocal cords under direct vision, positive ETCO2 and breath sounds checked- equal and bilateral Secured at: 22 cm Tube secured with: Tape Dental Injury: Teeth and Oropharynx as per pre-operative assessment  Comments: DL x 1 with Mil 2 by CRNA.  Grade 3 view.  DL x 2 by Dr. Lucious and successful intubation.  EBBS and VSS

## 2024-01-09 ENCOUNTER — Telehealth (HOSPITAL_COMMUNITY): Payer: Self-pay

## 2024-01-09 NOTE — Telephone Encounter (Signed)
 Spoke with patient to complete post procedure follow up call.  Patient reports no complications with groin sites.   Instructions reviewed with patient:  Remove large bandage at puncture site after 24 hours. It is normal to have bruising, tenderness, mild swelling, and a pea or marble sized lump/knot at the groin site which can take up to three months to resolve.  Get help right away if you notice sudden swelling at the puncture site.  Check your puncture site every day for signs of infection: fever, redness, swelling, pus drainage, warmth, foul odor or excessive pain. If this occurs, please call [phone], to speak with the RN Navigator. Get help right away if your puncture site is bleeding and the bleeding does not stop after applying firm pressure to the area.  You may continue to have skipped beats/ atrial fibrillation during the first several months after your procedure.  It is very important not to miss any doses of your blood thinner Eliquis .    You will follow up with the Afib clinic 4 weeks after your procedure and follow up with Dr. Dr. Inocencio 3 months after your procedure.  Activity restrictions reviewed.  Patient verbalized understanding to all instructions provided.

## 2024-01-12 LAB — POCT ACTIVATED CLOTTING TIME
Activated Clotting Time: 297 s
Activated Clotting Time: 365 s

## 2024-01-23 DIAGNOSIS — L820 Inflamed seborrheic keratosis: Secondary | ICD-10-CM | POA: Diagnosis not present

## 2024-01-23 DIAGNOSIS — D485 Neoplasm of uncertain behavior of skin: Secondary | ICD-10-CM | POA: Diagnosis not present

## 2024-01-23 DIAGNOSIS — L814 Other melanin hyperpigmentation: Secondary | ICD-10-CM | POA: Diagnosis not present

## 2024-01-23 DIAGNOSIS — L821 Other seborrheic keratosis: Secondary | ICD-10-CM | POA: Diagnosis not present

## 2024-01-23 DIAGNOSIS — L570 Actinic keratosis: Secondary | ICD-10-CM | POA: Diagnosis not present

## 2024-01-26 ENCOUNTER — Other Ambulatory Visit: Payer: Self-pay | Admitting: Cardiology

## 2024-01-27 MED ORDER — FLECAINIDE ACETATE 100 MG PO TABS: 100.0000 mg | ORAL_TABLET | Freq: Two times a day (BID) | ORAL | 2 refills | Status: AC

## 2024-02-05 ENCOUNTER — Ambulatory Visit (HOSPITAL_COMMUNITY)
Admission: RE | Admit: 2024-02-05 | Discharge: 2024-02-05 | Disposition: A | Discharge status: Home / Self Care | Source: Ambulatory Visit | Attending: Physician Assistant | Admitting: Physician Assistant

## 2024-02-05 VITALS — BP 122/70 | HR 74 | Ht 66.0 in | Wt 254.2 lb

## 2024-02-05 DIAGNOSIS — I4891 Unspecified atrial fibrillation: Secondary | ICD-10-CM

## 2024-02-05 DIAGNOSIS — I483 Typical atrial flutter: Secondary | ICD-10-CM | POA: Diagnosis not present

## 2024-02-05 DIAGNOSIS — Z79899 Other long term (current) drug therapy: Secondary | ICD-10-CM | POA: Diagnosis not present

## 2024-02-05 DIAGNOSIS — Z5181 Encounter for therapeutic drug level monitoring: Secondary | ICD-10-CM | POA: Diagnosis not present

## 2024-02-05 DIAGNOSIS — D6869 Other thrombophilia: Secondary | ICD-10-CM

## 2024-02-05 DIAGNOSIS — I4819 Other persistent atrial fibrillation: Secondary | ICD-10-CM | POA: Diagnosis not present
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
## 2024-02-07 DIAGNOSIS — C44329 Squamous cell carcinoma of skin of other parts of face: Secondary | ICD-10-CM | POA: Diagnosis not present

## 2024-02-11 ENCOUNTER — Other Ambulatory Visit: Payer: Self-pay

## 2024-02-11 ENCOUNTER — Other Ambulatory Visit: Payer: Self-pay | Admitting: Internal Medicine

## 2024-02-26 ENCOUNTER — Other Ambulatory Visit: Payer: Self-pay | Admitting: Internal Medicine

## 2024-04-01 ENCOUNTER — Ambulatory Visit: Payer: Self-pay | Admitting: Internal Medicine

## 2024-04-01 ENCOUNTER — Other Ambulatory Visit

## 2024-04-01 DIAGNOSIS — E538 Deficiency of other specified B group vitamins: Secondary | ICD-10-CM | POA: Diagnosis not present

## 2024-04-01 DIAGNOSIS — E1169 Type 2 diabetes mellitus with other specified complication: Secondary | ICD-10-CM | POA: Diagnosis not present

## 2024-04-01 DIAGNOSIS — E559 Vitamin D deficiency, unspecified: Secondary | ICD-10-CM | POA: Diagnosis not present

## 2024-04-01 DIAGNOSIS — E785 Hyperlipidemia, unspecified: Secondary | ICD-10-CM | POA: Diagnosis not present

## 2024-04-01 LAB — URINALYSIS, ROUTINE W REFLEX MICROSCOPIC
Bilirubin Urine: NEGATIVE
Hgb urine dipstick: NEGATIVE
Ketones, ur: NEGATIVE
Leukocytes,Ua: NEGATIVE
Nitrite: NEGATIVE
Specific Gravity, Urine: >=1.03 — AB (ref 1.000–1.030)
Total Protein, Urine: NEGATIVE
Urine Glucose: NEGATIVE
Urobilinogen, UA: 0.2 (ref 0.0–1.0)
pH: 5.5 (ref 5.0–8.0)

## 2024-04-01 LAB — BASIC METABOLIC PANEL WITH GFR
BUN: 15 mg/dL (ref 6–23)
CO2: 30 meq/L (ref 19–32)
Calcium: 9.5 mg/dL (ref 8.4–10.5)
Chloride: 102 meq/L (ref 96–112)
Creatinine, Ser: 0.84 mg/dL (ref 0.40–1.50)
GFR: 84.26 mL/min
Glucose, Bld: 98 mg/dL (ref 70–99)
Potassium: 4.6 meq/L (ref 3.5–5.1)
Sodium: 140 meq/L (ref 135–145)

## 2024-04-01 LAB — MICROALBUMIN / CREATININE URINE RATIO
Creatinine,U: 160.1 mg/dL
Microalb Creat Ratio: 20.4 mg/g (ref 0.0–30.0)
Microalb, Ur: 3.3 mg/dL — ABNORMAL HIGH (ref 0.7–1.9)

## 2024-04-01 LAB — TSH: TSH: 2.22 u[IU]/mL (ref 0.35–5.50)

## 2024-04-01 LAB — LIPID PANEL
Cholesterol: 97 mg/dL (ref 28–200)
HDL: 32.9 mg/dL — ABNORMAL LOW
LDL Cholesterol: 30 mg/dL (ref 10–99)
NonHDL: 63.79
Total CHOL/HDL Ratio: 3
Triglycerides: 168 mg/dL — ABNORMAL HIGH (ref 10.0–149.0)
VLDL: 33.6 mg/dL (ref 0.0–40.0)

## 2024-04-01 LAB — CBC WITH DIFFERENTIAL/PLATELET
Basophils Absolute: 0 10*3/uL (ref 0.0–0.1)
Basophils Relative: 0.4 % (ref 0.0–3.0)
Eosinophils Absolute: 0.1 10*3/uL (ref 0.0–0.7)
Eosinophils Relative: 1.5 % (ref 0.0–5.0)
HCT: 44.1 % (ref 39.0–52.0)
Hemoglobin: 14.5 g/dL (ref 13.0–17.0)
Lymphocytes Relative: 19.9 % (ref 12.0–46.0)
Lymphs Abs: 1.7 10*3/uL (ref 0.7–4.0)
MCHC: 32.8 g/dL (ref 30.0–36.0)
MCV: 87.9 fl (ref 78.0–100.0)
Monocytes Absolute: 0.8 10*3/uL (ref 0.1–1.0)
Monocytes Relative: 9.3 % (ref 3.0–12.0)
Neutro Abs: 5.8 10*3/uL (ref 1.4–7.7)
Neutrophils Relative %: 68.9 % (ref 43.0–77.0)
Platelets: 299 10*3/uL (ref 150.0–400.0)
RBC: 5.02 Mil/uL (ref 4.22–5.81)
RDW: 14.5 % (ref 11.5–15.5)
WBC: 8.3 10*3/uL (ref 4.0–10.5)

## 2024-04-01 LAB — HEPATIC FUNCTION PANEL
ALT: 12 U/L (ref 3–53)
AST: 15 U/L (ref 5–37)
Albumin: 4.2 g/dL (ref 3.5–5.2)
Alkaline Phosphatase: 70 U/L (ref 39–117)
Bilirubin, Direct: 0.1 mg/dL (ref 0.1–0.3)
Total Bilirubin: 0.4 mg/dL (ref 0.2–1.2)
Total Protein: 6.8 g/dL (ref 6.0–8.3)

## 2024-04-01 LAB — VITAMIN D 25 HYDROXY (VIT D DEFICIENCY, FRACTURES): VITD: 69.04 ng/mL (ref 30.00–100.00)

## 2024-04-01 LAB — VITAMIN B12: Vitamin B-12: 314 pg/mL (ref 211–911)

## 2024-04-01 LAB — HEMOGLOBIN A1C: Hgb A1c MFr Bld: 5.8 % (ref 4.6–6.5)

## 2024-04-01 NOTE — Progress Notes (Signed)
 The test results show that your current treatment is OK, as the tests are stable.  Please continue the same plan.  There is no other need for change of treatment or further evaluation based on these results, at this time.  thanks

## 2024-04-05 ENCOUNTER — Other Ambulatory Visit: Payer: Self-pay | Admitting: Cardiology

## 2024-04-08 ENCOUNTER — Encounter: Admitting: Internal Medicine

## 2024-04-19 ENCOUNTER — Ambulatory Visit: Admitting: Cardiology

## 2024-05-04 ENCOUNTER — Ambulatory Visit: Admitting: Cardiology

## 2024-05-12 ENCOUNTER — Ambulatory Visit: Admitting: Internal Medicine

## 2024-05-19 ENCOUNTER — Ambulatory Visit

## 2024-10-14 ENCOUNTER — Ambulatory Visit: Admitting: Family Medicine
